# Patient Record
Sex: Male | Born: 1950
Health system: Southern US, Community
[De-identification: ages and names within clinical notes are randomized; demographics above are authoritative.]

## PROBLEM LIST (undated history)

## (undated) DIAGNOSIS — T8859XA Other complications of anesthesia, initial encounter: Secondary | ICD-10-CM

## (undated) DIAGNOSIS — J45909 Unspecified asthma, uncomplicated: Secondary | ICD-10-CM

## (undated) DIAGNOSIS — E119 Type 2 diabetes mellitus without complications: Secondary | ICD-10-CM

## (undated) DIAGNOSIS — N400 Enlarged prostate without lower urinary tract symptoms: Secondary | ICD-10-CM

## (undated) DIAGNOSIS — I10 Essential (primary) hypertension: Secondary | ICD-10-CM

## (undated) DIAGNOSIS — M1990 Unspecified osteoarthritis, unspecified site: Secondary | ICD-10-CM

## (undated) DIAGNOSIS — D573 Sickle-cell trait: Secondary | ICD-10-CM

## (undated) DIAGNOSIS — K219 Gastro-esophageal reflux disease without esophagitis: Secondary | ICD-10-CM

## (undated) DIAGNOSIS — E785 Hyperlipidemia, unspecified: Secondary | ICD-10-CM

## (undated) DIAGNOSIS — G4730 Sleep apnea, unspecified: Secondary | ICD-10-CM

## (undated) HISTORY — DX: Type 2 diabetes mellitus without complications: E11.9

## (undated) HISTORY — DX: Sleep apnea, unspecified: G47.30

## (undated) HISTORY — PX: COLONOSCOPY: SHX174

## (undated) HISTORY — PX: OTHER SURGICAL HISTORY: SHX169

## (undated) HISTORY — DX: Hyperlipidemia, unspecified: E78.5

## (undated) HISTORY — DX: Unspecified asthma, uncomplicated: J45.909

## (undated) HISTORY — DX: Sickle-cell trait: D57.3

## (undated) HISTORY — DX: Essential (primary) hypertension: I10

## (undated) HISTORY — PX: CARDIAC CATHETERIZATION: SHX172

---

## 2005-04-19 ENCOUNTER — Ambulatory Visit: Payer: Self-pay | Admitting: Internal Medicine

## 2005-07-25 ENCOUNTER — Ambulatory Visit: Payer: Self-pay | Admitting: Internal Medicine

## 2006-05-29 ENCOUNTER — Emergency Department: Payer: Self-pay | Admitting: Emergency Medicine

## 2006-05-29 ENCOUNTER — Other Ambulatory Visit: Payer: Self-pay

## 2006-05-30 ENCOUNTER — Ambulatory Visit: Payer: Self-pay | Admitting: Emergency Medicine

## 2006-09-21 ENCOUNTER — Emergency Department: Payer: Self-pay

## 2006-09-28 ENCOUNTER — Ambulatory Visit: Payer: Self-pay | Admitting: Internal Medicine

## 2006-11-19 ENCOUNTER — Emergency Department: Payer: Self-pay | Admitting: General Practice

## 2006-11-19 ENCOUNTER — Other Ambulatory Visit: Payer: Self-pay

## 2006-11-27 ENCOUNTER — Ambulatory Visit: Payer: Self-pay | Admitting: Internal Medicine

## 2007-02-19 ENCOUNTER — Emergency Department: Payer: Self-pay | Admitting: Emergency Medicine

## 2007-02-19 ENCOUNTER — Other Ambulatory Visit: Payer: Self-pay

## 2007-05-09 ENCOUNTER — Ambulatory Visit: Payer: Self-pay | Admitting: Internal Medicine

## 2007-05-16 ENCOUNTER — Ambulatory Visit: Payer: Self-pay | Admitting: Internal Medicine

## 2007-06-06 ENCOUNTER — Ambulatory Visit: Payer: Self-pay | Admitting: Unknown Physician Specialty

## 2007-06-12 ENCOUNTER — Ambulatory Visit: Payer: Self-pay | Admitting: Unknown Physician Specialty

## 2007-08-19 ENCOUNTER — Other Ambulatory Visit: Payer: Self-pay

## 2007-08-19 ENCOUNTER — Emergency Department: Payer: Self-pay

## 2007-12-20 ENCOUNTER — Ambulatory Visit: Payer: Self-pay | Admitting: Internal Medicine

## 2008-01-22 ENCOUNTER — Emergency Department: Payer: Self-pay | Admitting: Emergency Medicine

## 2008-01-22 ENCOUNTER — Other Ambulatory Visit: Payer: Self-pay

## 2008-03-13 ENCOUNTER — Ambulatory Visit: Payer: Self-pay | Admitting: Cardiology

## 2008-09-08 ENCOUNTER — Ambulatory Visit: Payer: Self-pay | Admitting: Internal Medicine

## 2008-11-03 ENCOUNTER — Ambulatory Visit: Payer: Self-pay | Admitting: Adult Health Nurse Practitioner

## 2008-12-14 ENCOUNTER — Emergency Department: Payer: Self-pay | Admitting: Emergency Medicine

## 2009-03-30 ENCOUNTER — Ambulatory Visit: Payer: Self-pay | Admitting: Unknown Physician Specialty

## 2009-05-07 ENCOUNTER — Ambulatory Visit: Payer: Self-pay | Admitting: Unknown Physician Specialty

## 2009-08-31 ENCOUNTER — Ambulatory Visit: Payer: Self-pay | Admitting: Internal Medicine

## 2009-10-08 ENCOUNTER — Ambulatory Visit: Payer: Self-pay | Admitting: Internal Medicine

## 2010-05-04 ENCOUNTER — Ambulatory Visit: Payer: Self-pay | Admitting: Internal Medicine

## 2011-10-26 ENCOUNTER — Ambulatory Visit: Payer: Self-pay | Admitting: Internal Medicine

## 2011-11-24 ENCOUNTER — Ambulatory Visit: Payer: Self-pay | Admitting: Specialist

## 2011-11-24 LAB — CREATININE, SERUM
Creatinine: 1.08 mg/dL (ref 0.60–1.30)
EGFR (African American): >60
EGFR (Non-African Amer.): >60

## 2013-01-28 ENCOUNTER — Ambulatory Visit: Payer: Self-pay

## 2013-03-11 ENCOUNTER — Ambulatory Visit: Payer: Self-pay | Admitting: Urology

## 2013-09-25 ENCOUNTER — Ambulatory Visit: Payer: Self-pay | Admitting: Urology

## 2014-01-16 DIAGNOSIS — E785 Hyperlipidemia, unspecified: Secondary | ICD-10-CM | POA: Insufficient documentation

## 2014-01-16 DIAGNOSIS — I10 Essential (primary) hypertension: Secondary | ICD-10-CM | POA: Insufficient documentation

## 2014-01-29 DIAGNOSIS — J449 Chronic obstructive pulmonary disease, unspecified: Secondary | ICD-10-CM | POA: Insufficient documentation

## 2014-01-29 DIAGNOSIS — G4730 Sleep apnea, unspecified: Secondary | ICD-10-CM | POA: Insufficient documentation

## 2014-11-26 DIAGNOSIS — E119 Type 2 diabetes mellitus without complications: Secondary | ICD-10-CM | POA: Insufficient documentation

## 2015-10-07 DIAGNOSIS — M545 Low back pain, unspecified: Secondary | ICD-10-CM | POA: Insufficient documentation

## 2015-10-07 DIAGNOSIS — G8929 Other chronic pain: Secondary | ICD-10-CM | POA: Insufficient documentation

## 2016-08-18 ENCOUNTER — Other Ambulatory Visit: Payer: Self-pay | Admitting: Otolaryngology

## 2016-08-18 DIAGNOSIS — R42 Dizziness and giddiness: Secondary | ICD-10-CM

## 2016-08-26 ENCOUNTER — Ambulatory Visit
Admission: RE | Admit: 2016-08-26 | Discharge: 2016-08-26 | Disposition: A | Discharge status: Home / Self Care | Payer: BLUE CROSS/BLUE SHIELD | Source: Ambulatory Visit | Attending: Otolaryngology | Admitting: Otolaryngology

## 2016-08-26 DIAGNOSIS — G319 Degenerative disease of nervous system, unspecified: Secondary | ICD-10-CM | POA: Diagnosis not present

## 2016-08-26 DIAGNOSIS — R42 Dizziness and giddiness: Secondary | ICD-10-CM | POA: Diagnosis not present

## 2016-08-26 MED ORDER — GADOBENATE DIMEGLUMINE 529 MG/ML IV SOLN
20.0000 mL | Freq: Once | INTRAVENOUS | Status: AC | PRN
  Administered 2016-08-26: 18 mL via INTRAVENOUS

## 2016-11-07 DIAGNOSIS — R0602 Shortness of breath: Secondary | ICD-10-CM | POA: Diagnosis not present

## 2016-11-07 DIAGNOSIS — Z7141 Alcohol abuse counseling and surveillance of alcoholic: Secondary | ICD-10-CM | POA: Diagnosis not present

## 2016-11-07 DIAGNOSIS — E6609 Other obesity due to excess calories: Secondary | ICD-10-CM | POA: Diagnosis not present

## 2016-11-07 DIAGNOSIS — G4733 Obstructive sleep apnea (adult) (pediatric): Secondary | ICD-10-CM | POA: Diagnosis not present

## 2016-11-24 DIAGNOSIS — E119 Type 2 diabetes mellitus without complications: Secondary | ICD-10-CM | POA: Diagnosis not present

## 2016-11-29 DIAGNOSIS — G4733 Obstructive sleep apnea (adult) (pediatric): Secondary | ICD-10-CM | POA: Diagnosis not present

## 2016-11-29 DIAGNOSIS — R413 Other amnesia: Secondary | ICD-10-CM | POA: Diagnosis not present

## 2016-11-29 DIAGNOSIS — H8110 Benign paroxysmal vertigo, unspecified ear: Secondary | ICD-10-CM | POA: Diagnosis not present

## 2016-11-30 DIAGNOSIS — H8110 Benign paroxysmal vertigo, unspecified ear: Secondary | ICD-10-CM | POA: Insufficient documentation

## 2016-12-07 DIAGNOSIS — H8112 Benign paroxysmal vertigo, left ear: Secondary | ICD-10-CM | POA: Diagnosis not present

## 2016-12-13 DIAGNOSIS — R0602 Shortness of breath: Secondary | ICD-10-CM | POA: Diagnosis not present

## 2016-12-15 DIAGNOSIS — H8112 Benign paroxysmal vertigo, left ear: Secondary | ICD-10-CM | POA: Diagnosis not present

## 2016-12-25 DIAGNOSIS — G4733 Obstructive sleep apnea (adult) (pediatric): Secondary | ICD-10-CM | POA: Diagnosis not present

## 2016-12-29 DIAGNOSIS — H8112 Benign paroxysmal vertigo, left ear: Secondary | ICD-10-CM | POA: Diagnosis not present

## 2017-01-10 DIAGNOSIS — G4733 Obstructive sleep apnea (adult) (pediatric): Secondary | ICD-10-CM | POA: Diagnosis not present

## 2017-01-12 DIAGNOSIS — H8112 Benign paroxysmal vertigo, left ear: Secondary | ICD-10-CM | POA: Diagnosis not present

## 2017-01-23 DIAGNOSIS — E119 Type 2 diabetes mellitus without complications: Secondary | ICD-10-CM | POA: Diagnosis not present

## 2017-01-23 DIAGNOSIS — H8110 Benign paroxysmal vertigo, unspecified ear: Secondary | ICD-10-CM | POA: Diagnosis not present

## 2017-01-23 DIAGNOSIS — R972 Elevated prostate specific antigen [PSA]: Secondary | ICD-10-CM | POA: Diagnosis not present

## 2017-01-23 DIAGNOSIS — J449 Chronic obstructive pulmonary disease, unspecified: Secondary | ICD-10-CM | POA: Diagnosis not present

## 2017-01-23 DIAGNOSIS — I10 Essential (primary) hypertension: Secondary | ICD-10-CM | POA: Diagnosis not present

## 2017-01-30 DIAGNOSIS — E7800 Pure hypercholesterolemia, unspecified: Secondary | ICD-10-CM | POA: Diagnosis not present

## 2017-01-30 DIAGNOSIS — I10 Essential (primary) hypertension: Secondary | ICD-10-CM | POA: Diagnosis not present

## 2017-01-30 DIAGNOSIS — E119 Type 2 diabetes mellitus without complications: Secondary | ICD-10-CM | POA: Diagnosis not present

## 2017-01-30 DIAGNOSIS — H8110 Benign paroxysmal vertigo, unspecified ear: Secondary | ICD-10-CM | POA: Diagnosis not present

## 2017-05-11 DIAGNOSIS — Z23 Encounter for immunization: Secondary | ICD-10-CM | POA: Diagnosis not present

## 2017-05-15 DIAGNOSIS — J45998 Other asthma: Secondary | ICD-10-CM | POA: Diagnosis not present

## 2017-05-15 DIAGNOSIS — Z23 Encounter for immunization: Secondary | ICD-10-CM | POA: Diagnosis not present

## 2017-05-15 DIAGNOSIS — G4710 Hypersomnia, unspecified: Secondary | ICD-10-CM | POA: Diagnosis not present

## 2017-05-15 DIAGNOSIS — Z7141 Alcohol abuse counseling and surveillance of alcoholic: Secondary | ICD-10-CM | POA: Diagnosis not present

## 2017-05-15 DIAGNOSIS — G4733 Obstructive sleep apnea (adult) (pediatric): Secondary | ICD-10-CM | POA: Diagnosis not present

## 2017-05-31 DIAGNOSIS — R413 Other amnesia: Secondary | ICD-10-CM | POA: Diagnosis not present

## 2017-05-31 DIAGNOSIS — G4733 Obstructive sleep apnea (adult) (pediatric): Secondary | ICD-10-CM | POA: Diagnosis not present

## 2017-05-31 DIAGNOSIS — H8110 Benign paroxysmal vertigo, unspecified ear: Secondary | ICD-10-CM | POA: Diagnosis not present

## 2017-06-05 DIAGNOSIS — G4733 Obstructive sleep apnea (adult) (pediatric): Secondary | ICD-10-CM | POA: Diagnosis not present

## 2017-09-04 DIAGNOSIS — E7800 Pure hypercholesterolemia, unspecified: Secondary | ICD-10-CM | POA: Diagnosis not present

## 2017-09-04 DIAGNOSIS — I10 Essential (primary) hypertension: Secondary | ICD-10-CM | POA: Diagnosis not present

## 2017-09-04 DIAGNOSIS — H8110 Benign paroxysmal vertigo, unspecified ear: Secondary | ICD-10-CM | POA: Diagnosis not present

## 2017-09-04 DIAGNOSIS — E119 Type 2 diabetes mellitus without complications: Secondary | ICD-10-CM | POA: Diagnosis not present

## 2017-09-11 DIAGNOSIS — G4733 Obstructive sleep apnea (adult) (pediatric): Secondary | ICD-10-CM | POA: Diagnosis not present

## 2017-09-11 DIAGNOSIS — M25512 Pain in left shoulder: Secondary | ICD-10-CM | POA: Diagnosis not present

## 2017-09-11 DIAGNOSIS — M19011 Primary osteoarthritis, right shoulder: Secondary | ICD-10-CM | POA: Diagnosis not present

## 2017-09-11 DIAGNOSIS — Z0000 Encounter for general adult medical examination without abnormal findings: Secondary | ICD-10-CM | POA: Diagnosis not present

## 2017-09-11 DIAGNOSIS — I10 Essential (primary) hypertension: Secondary | ICD-10-CM | POA: Diagnosis not present

## 2017-09-19 ENCOUNTER — Ambulatory Visit: Payer: BLUE CROSS/BLUE SHIELD

## 2017-09-19 DIAGNOSIS — G4733 Obstructive sleep apnea (adult) (pediatric): Secondary | ICD-10-CM

## 2017-09-19 NOTE — Progress Notes (Signed)
95 percentile pressure 11    95th percentile leak 19.8   apnea index 0.4 /hr  apnea-hypopnea index  0.5 /hr   total days used  >4 hr 31 days  total days used <4 hr 88 days  Total compliance 24 percent  Will bring pt a new mask to help with putting on prior to falling sleep

## 2017-09-25 ENCOUNTER — Ambulatory Visit: Payer: Self-pay | Admitting: Internal Medicine

## 2017-09-26 ENCOUNTER — Ambulatory Visit (INDEPENDENT_AMBULATORY_CARE_PROVIDER_SITE_OTHER): Payer: BLUE CROSS/BLUE SHIELD

## 2017-09-26 DIAGNOSIS — G4733 Obstructive sleep apnea (adult) (pediatric): Secondary | ICD-10-CM | POA: Diagnosis not present

## 2017-09-26 NOTE — Progress Notes (Signed)
Charles Mata came in for a mask fit. He has been falling asleep watching tv so I gave him a mask he could watch TV in so when fell asleep he would have cpap on.

## 2017-10-22 ENCOUNTER — Telehealth: Payer: Self-pay

## 2017-10-22 NOTE — Telephone Encounter (Signed)
Patient advised tax paper for 2018 is ready for pickup.[place]

## 2017-10-23 ENCOUNTER — Ambulatory Visit: Payer: BLUE CROSS/BLUE SHIELD | Admitting: Internal Medicine

## 2017-10-23 ENCOUNTER — Encounter: Payer: Self-pay | Admitting: Internal Medicine

## 2017-10-23 VITALS — BP 128/72 | HR 91 | Resp 16 | Ht 68.0 in | Wt 246.8 lb

## 2017-10-23 DIAGNOSIS — J449 Chronic obstructive pulmonary disease, unspecified: Secondary | ICD-10-CM

## 2017-10-23 DIAGNOSIS — Z9989 Dependence on other enabling machines and devices: Secondary | ICD-10-CM

## 2017-10-23 DIAGNOSIS — G4733 Obstructive sleep apnea (adult) (pediatric): Secondary | ICD-10-CM | POA: Diagnosis not present

## 2017-10-23 DIAGNOSIS — F1020 Alcohol dependence, uncomplicated: Secondary | ICD-10-CM

## 2017-10-23 MED ORDER — FLUTICASONE FUROATE-VILANTEROL 100-25 MCG/INH IN AEPB: 1.0000 | INHALATION_SPRAY | Freq: Every day | RESPIRATORY_TRACT | 4 refills | Status: DC

## 2017-10-23 NOTE — Progress Notes (Signed)
[facility] [place], [place] [address]  Pulmonary Sleep Medicine  Office Visit Note  Patient Name: Charles Mata DOB: 1950/11/30 MRN [id number]  Date of Service: 10/23/2017  Complaints/HPI: States that his breathing has been a little worse of late. He has had to use jhis inhaer more frequently. Patient states tha the has been trying to use the CPAP but falls asleep early sometimes  Without the mask.  In addition he continues to be valve on regular basis.  States that is the only thing he does.  I explained to him that the alcohol is probably helping to disrupt his sleep.  Says now that he understands this he is going to try to stop drinking.  He does not smoke.  He is currently on just Combivent and will probably benefit from additional the [place] by and steroid for his COPD  ROS  General: (-) fever, (-) chills, (-) night sweats, (-) weakness Skin: (-) rashes, (-) itching,. Eyes: (-) visual changes, (-) redness, (-) itching. Nose and Sinuses: (-) nasal stuffiness or itchiness, (-) postnasal drip, (-) nosebleeds, (-) sinus trouble. Mouth and Throat: (-) sore throat, (-) hoarseness. Neck: (-) swollen glands, (-) enlarged thyroid, (-) neck pain. Respiratory: - cough, (-) bloody sputum, + shortness of breath, - wheezing. Cardiovascular: - ankle swelling, (-) chest pain. Lymphatic: (-) lymph node enlargement. Neurologic: (-) numbness, (-) tingling. Psychiatric: (-) anxiety, (-) depression   Current Medication: Outpatient Encounter Medications as of 10/23/2017  Medication Sig  . glucose blood (CONTOUR NEXT TEST) test strip   . glyBURIDE (DIABETA) 5 MG tablet TAKE 1 TABLET BY MOUTH EVERY DAY WITH BREAKFAST  . Ipratropium-Albuterol (COMBIVENT RESPIMAT) 20-100 MCG/ACT AERS respimat INHALE 1 PUFF BY MOUTH 4 TIMES A DAY AS NEEDED  . lisinopril (PRINIVIL,ZESTRIL) 5 MG tablet Take by mouth.  . montelukast (SINGULAIR) 10 MG tablet TAKE 1 TABLET BY MOUTH ONCE DAILY  .  omeprazole (PRILOSEC) 40 MG capsule TAKE 1 CAPSULE BY MOUTH EVERY DAY  . tamsulosin (FLOMAX) 0.4 MG CAPS capsule TAKE ONE CAPSULE BY MOUTH EVERY DAY 1/2 HOUR AFTER A MEAL  . Vitamin D, Ergocalciferol, (DRISDOL) 50000 units CAPS capsule TAKE ONE CAPSULE BY MOUTH ONE TIME PER WEEK  . aspirin EC 81 MG tablet Take by mouth.  . CVS VITAMIN B12 1000 MCG tablet TAKE 1 TABLET (1,000 MCG TOTAL) BY MOUTH ONCE DAILY.  . furosemide (LASIX) 40 MG tablet TAKE 1 TABLET (40 MG TOTAL) BY MOUTH ONCE DAILY.  Marland Kitchen oxymetazoline (NASAL RELIEF) 0.05 % nasal spray Place into the nose.  Marland Kitchen PROCTOSOL HC 2.5 % rectal cream APPLY TO AFFECTED AREA 3 TIMES A DAY  . rosuvastatin (CRESTOR) 10 MG tablet Take 5 mg by mouth daily.   No facility-administered encounter medications on file as of 10/23/2017.     Surgical History: Past Surgical History:  Procedure Laterality Date  . COLONOSCOPY    . heart catheriation      Medical History: Past Medical History:  Diagnosis Date  . Asthma   . Diabetes ([place])   . Hyperlipidemia   . Hypertension   . Sickle cell trait ([place])   . Sleep apnea     Family History: History reviewed. No pertinent family history.  Social History: Social History   Socioeconomic History  . Marital status: Widowed    Spouse name: Not on file  . Number of children: Not on file  . Years of education: Not on file  . Highest education level: Not on  file  Social Needs  . Financial resource strain: Not on file  . Food insecurity - worry: Not on file  . Food insecurity - inability: Not on file  . Transportation needs - medical: Not on file  . Transportation needs - non-medical: Not on file  Occupational History  . Not on file  Tobacco Use  . Smoking status: Former Research scientist (life sciences)  . Smokeless tobacco: Never Used  Substance and Sexual Activity  . Alcohol use: Yes    Alcohol/week: 1.8 oz    Types: 3 Glasses of wine per week    Frequency: Never    Comment: a night  . Drug use: No  . Sexual activity:  Not on file  Other Topics Concern  . Not on file  Social History Narrative  . Not on file    Vital Signs: Blood pressure 128/72, pulse 91, resp. rate 16, height 5\' 8"  (1.727 m), weight 246 lb 12.8 oz (111.9 kg), SpO2 95 %.  Examination: General Appearance: The patient is well-developed, well-nourished, and in no distress. Skin: Gross inspection of skin unremarkable. Head: normocephalic, no gross deformities. Eyes: no gross deformities noted. ENT: ears appear grossly normal no exudates. Neck: Supple. No thyromegaly. No LAD. Respiratory: scattered rhonchi. Cardiovascular: Normal S1 and S2 without murmur or rub. Extremities: No cyanosis. pulses are equal. Neurologic: Alert and oriented. No involuntary movements.  LABS: No results found for this or any previous visit (from the past 2160 hour(s)).  Radiology: Mr Albertina Sen[place] XT Contrast  Result Date: 08/26/2016 CLINICAL DATA:  Dizziness for 2 months. Occasional ringing in the ears. EXAM: MRI HEAD WITHOUT AND WITH CONTRAST TECHNIQUE: Multiplanar, multiecho pulse sequences of the brain and surrounding structures were obtained without and with intravenous contrast. CONTRAST:  52mL MULTIHANCE GADOBENATE DIMEGLUMINE 529 MG/ML IV SOLN COMPARISON:  None. FINDINGS: Brain: No evidence for acute infarction, hemorrhage, mass lesion, hydrocephalus, or extra-axial fluid. Premature for age cerebral and cerebellar atrophy. No significant white matter disease. Thin-section imaging through the posterior fossa demonstrates normal internal auditory canals. No evidence for vestibular schwannoma, posterior fossa mass, or temporal bone inflammatory process. Vascular: Normal flow voids.  Major venous sinuses are patent. Skull and upper cervical spine: Normal marrow signal. Sinuses/Orbits: Negative. Other: None. IMPRESSION: Atrophy, premature for age. No acute intracranial findings. No structural abnormality is seen related to dizziness. Electronically Signed   By:  Staci Righter M.D.   On: 08/26/2016 14:35    No results found.  No results found.    Assessment and Plan: Patient Active Problem List   Diagnosis Date Noted  . Benign paroxysmal positional vertigo 11/30/2016  . Chronic midline low back pain without sciatica 10/07/2015  . Type 2 diabetes mellitus, controlled ([place]) 11/26/2014  . COPD, mild ([place]) 01/29/2014  . Sleep apnea 01/29/2014  . Hyperlipemia 01/16/2014  . Hypertension 01/16/2014    1. COPD we will continue with the Combivent and today we added breo to his regimen.  Patient needs to be regular with his inhalers.  I given him instructions on how to use the [facility]. 2. Ongoing ETOH abuse  Once again discussed at length with him the need to stop drinking he says that he is going to try once more 3. Sleep Apnea with sleep disruption due to ETOH will continue withPAP therapy at this time.  We will continue with supportive care and follow along 4. Obesity be needs to work on weight loss this was discussed at  Length. the weight gain is likely related to ongoing  alcohol abuse  General Counseling: I have discussed the findings of the evaluation and examination with Charles Mata.  I have also discussed any further diagnostic evaluation thatmay be needed or ordered today. Gildo verbalizes understanding of the findings of todays visit. We also reviewed his medications today and discussed drug interactions and side effects including but not limited excessive drowsiness and altered mental states. We also discussed that there is always a risk not just to him but also people around him. he has been encouraged to call the office with any questions or concerns that should arise related to todays visit.    Time spent: 78min  I have personally obtained a history, examined the patient, evaluated laboratory and imaging results, formulated the assessment and plan and placed orders.    [name], MD [facility] Pulmonary and Critical Care Sleep medicine

## 2017-10-23 NOTE — Patient Instructions (Signed)

## 2017-11-22 DIAGNOSIS — E119 Type 2 diabetes mellitus without complications: Secondary | ICD-10-CM | POA: Diagnosis not present

## 2017-11-22 DIAGNOSIS — G4733 Obstructive sleep apnea (adult) (pediatric): Secondary | ICD-10-CM | POA: Diagnosis not present

## 2017-11-22 DIAGNOSIS — M25511 Pain in right shoulder: Secondary | ICD-10-CM | POA: Diagnosis not present

## 2017-11-22 DIAGNOSIS — I10 Essential (primary) hypertension: Secondary | ICD-10-CM | POA: Diagnosis not present

## 2017-11-23 ENCOUNTER — Other Ambulatory Visit: Payer: Self-pay | Admitting: Internal Medicine

## 2017-11-23 DIAGNOSIS — M25511 Pain in right shoulder: Secondary | ICD-10-CM

## 2017-11-23 DIAGNOSIS — Z794 Long term (current) use of insulin: Secondary | ICD-10-CM

## 2017-11-23 DIAGNOSIS — E118 Type 2 diabetes mellitus with unspecified complications: Secondary | ICD-10-CM

## 2017-11-29 DIAGNOSIS — G4733 Obstructive sleep apnea (adult) (pediatric): Secondary | ICD-10-CM | POA: Diagnosis not present

## 2017-11-29 DIAGNOSIS — R413 Other amnesia: Secondary | ICD-10-CM | POA: Diagnosis not present

## 2017-11-29 DIAGNOSIS — H8110 Benign paroxysmal vertigo, unspecified ear: Secondary | ICD-10-CM | POA: Diagnosis not present

## 2017-11-29 DIAGNOSIS — M25511 Pain in right shoulder: Secondary | ICD-10-CM | POA: Diagnosis not present

## 2017-12-04 ENCOUNTER — Ambulatory Visit
Admission: RE | Admit: 2017-12-04 | Discharge: 2017-12-04 | Disposition: A | Discharge status: Home / Self Care | Payer: BLUE CROSS/BLUE SHIELD | Source: Ambulatory Visit | Attending: Internal Medicine | Admitting: Internal Medicine

## 2017-12-04 DIAGNOSIS — Z794 Long term (current) use of insulin: Secondary | ICD-10-CM

## 2017-12-04 DIAGNOSIS — E119 Type 2 diabetes mellitus without complications: Secondary | ICD-10-CM | POA: Insufficient documentation

## 2017-12-04 DIAGNOSIS — I10 Essential (primary) hypertension: Secondary | ICD-10-CM | POA: Insufficient documentation

## 2017-12-04 DIAGNOSIS — M25511 Pain in right shoulder: Secondary | ICD-10-CM | POA: Insufficient documentation

## 2017-12-04 DIAGNOSIS — S46011A Strain of muscle(s) and tendon(s) of the rotator cuff of right shoulder, initial encounter: Secondary | ICD-10-CM | POA: Diagnosis not present

## 2017-12-04 DIAGNOSIS — M24811 Other specific joint derangements of right shoulder, not elsewhere classified: Secondary | ICD-10-CM | POA: Insufficient documentation

## 2017-12-04 DIAGNOSIS — E118 Type 2 diabetes mellitus with unspecified complications: Secondary | ICD-10-CM

## 2017-12-26 DIAGNOSIS — M25512 Pain in left shoulder: Secondary | ICD-10-CM | POA: Diagnosis not present

## 2017-12-26 DIAGNOSIS — M25511 Pain in right shoulder: Secondary | ICD-10-CM | POA: Diagnosis not present

## 2017-12-26 DIAGNOSIS — M7541 Impingement syndrome of right shoulder: Secondary | ICD-10-CM | POA: Diagnosis not present

## 2018-01-02 ENCOUNTER — Ambulatory Visit: Payer: BLUE CROSS/BLUE SHIELD

## 2018-01-02 DIAGNOSIS — G4733 Obstructive sleep apnea (adult) (pediatric): Secondary | ICD-10-CM | POA: Diagnosis not present

## 2018-01-02 DIAGNOSIS — E119 Type 2 diabetes mellitus without complications: Secondary | ICD-10-CM | POA: Diagnosis not present

## 2018-01-02 NOTE — Progress Notes (Signed)
95 percentile pressure 11   95th percentile leak 29.6   apnea index 0.8 /hr  apnea-hypopnea index  1.0 /hr   total days used  >4 hr 58 days  total days used <4 hr 1 days  Total compliance 97 percent  Pt doing much better!

## 2018-01-23 ENCOUNTER — Ambulatory Visit: Payer: BLUE CROSS/BLUE SHIELD

## 2018-01-23 DIAGNOSIS — G4733 Obstructive sleep apnea (adult) (pediatric): Secondary | ICD-10-CM

## 2018-01-23 NOTE — Progress Notes (Signed)
Pt received new mask

## 2018-02-23 ENCOUNTER — Other Ambulatory Visit: Payer: Self-pay

## 2018-02-23 ENCOUNTER — Emergency Department: Payer: BLUE CROSS/BLUE SHIELD

## 2018-02-23 DIAGNOSIS — T608X1A Toxic effect of other pesticides, accidental (unintentional), initial encounter: Secondary | ICD-10-CM | POA: Insufficient documentation

## 2018-02-23 DIAGNOSIS — Z5321 Procedure and treatment not carried out due to patient leaving prior to being seen by health care provider: Secondary | ICD-10-CM | POA: Insufficient documentation

## 2018-02-23 DIAGNOSIS — R0603 Acute respiratory distress: Secondary | ICD-10-CM | POA: Diagnosis not present

## 2018-02-23 NOTE — ED Notes (Signed)
Spoke with Dr. Beather Arbour regarding patient, order received.

## 2018-02-23 NOTE — ED Triage Notes (Signed)
Patient reports after inhaling fly spray he fell asleep and when he woke up his vision was tunneled and he was tingling.  Patient with no respiratory distress noted, speaking in complete sentences.

## 2018-02-24 ENCOUNTER — Emergency Department
Admission: EM | Admit: 2018-02-24 | Discharge: 2018-02-24 | Disposition: A | Discharge status: Home / Self Care | Payer: BLUE CROSS/BLUE SHIELD | Attending: Emergency Medicine | Admitting: Emergency Medicine

## 2018-02-24 DIAGNOSIS — R0603 Acute respiratory distress: Secondary | ICD-10-CM | POA: Diagnosis not present

## 2018-02-25 ENCOUNTER — Telehealth: Payer: Self-pay | Admitting: Emergency Medicine

## 2018-02-25 NOTE — Telephone Encounter (Signed)
Called patient due to lwot to inquire about condition and follow up plans. Left message.   

## 2018-02-27 DIAGNOSIS — M7541 Impingement syndrome of right shoulder: Secondary | ICD-10-CM | POA: Diagnosis not present

## 2018-03-04 ENCOUNTER — Other Ambulatory Visit: Payer: Self-pay | Admitting: Internal Medicine

## 2018-03-07 DIAGNOSIS — H43812 Vitreous degeneration, left eye: Secondary | ICD-10-CM | POA: Diagnosis not present

## 2018-03-12 ENCOUNTER — Encounter: Payer: Self-pay | Admitting: Internal Medicine

## 2018-03-12 ENCOUNTER — Ambulatory Visit: Payer: BLUE CROSS/BLUE SHIELD | Admitting: Internal Medicine

## 2018-03-12 VITALS — BP 132/68 | HR 88 | Resp 16 | Ht 68.0 in | Wt 226.8 lb

## 2018-03-12 DIAGNOSIS — J449 Chronic obstructive pulmonary disease, unspecified: Secondary | ICD-10-CM

## 2018-03-12 DIAGNOSIS — R0602 Shortness of breath: Secondary | ICD-10-CM | POA: Diagnosis not present

## 2018-03-12 DIAGNOSIS — G4733 Obstructive sleep apnea (adult) (pediatric): Secondary | ICD-10-CM | POA: Diagnosis not present

## 2018-03-12 DIAGNOSIS — F10288 Alcohol dependence with other alcohol-induced disorder: Secondary | ICD-10-CM

## 2018-03-12 DIAGNOSIS — Z9989 Dependence on other enabling machines and devices: Secondary | ICD-10-CM

## 2018-03-12 DIAGNOSIS — J4489 Other specified chronic obstructive pulmonary disease: Secondary | ICD-10-CM

## 2018-03-12 NOTE — Patient Instructions (Signed)

## 2018-03-12 NOTE — Progress Notes (Signed)
[facility] [place], [place] [address]  Pulmonary Sleep Medicine   Office Visit Note  Patient Name: Charles Mata DOB: 1951-03-29 MRN [id number]  Date of Service: 03/12/2018  Complaints/HPI:  Is doing fairly well.  States he has some stops mode of smoking as well as stop drinking.  Over the course at his last visit he has lost approximately 20 lb.  States that down he is exercising little bit more.  Noted patient's in hospital.  His some overall sugars have also improved.  He is trying to use CPAP as prescribed  ROS  General: (-) fever, (-) chills, (-) night sweats, (-) weakness Skin: (-) rashes, (-) itching,. Eyes: (-) visual changes, (-) redness, (-) itching. Nose and Sinuses: (-) nasal stuffiness or itchiness, (-) postnasal drip, (-) nosebleeds, (-) sinus trouble. Mouth and Throat: (-) sore throat, (-) hoarseness. Neck: (-) swollen glands, (-) enlarged thyroid, (-) neck pain. Respiratory: - cough, (-) bloody sputum, + shortness of breath, - wheezing. Cardiovascular: - ankle swelling, (-) chest pain. Lymphatic: (-) lymph node enlargement. Neurologic: (-) numbness, (-) tingling. Psychiatric: (-) anxiety, (-) depression   Current Medication: Outpatient Encounter Medications as of 03/12/2018  Medication Sig  . aspirin EC 81 MG tablet Take by mouth.  . CVS VITAMIN B12 1000 MCG tablet TAKE 1 TABLET (1,000 MCG TOTAL) BY MOUTH ONCE DAILY.  . fluticasone furoate-vilanterol (BREO ELLIPTA) 100-25 MCG/INH AEPB Inhale 1 puff into the lungs daily.  . furosemide (LASIX) 40 MG tablet TAKE 1 TABLET (40 MG TOTAL) BY MOUTH ONCE DAILY.  Marland Kitchen glucose blood (CONTOUR NEXT TEST) test strip   . glyBURIDE (DIABETA) 5 MG tablet TAKE 1 TABLET BY MOUTH EVERY DAY WITH BREAKFAST  . Ipratropium-Albuterol (COMBIVENT RESPIMAT) 20-100 MCG/ACT AERS respimat INHALE 1 PUFF BY MOUTH 4 TIMES A DAY AS NEEDED  . lisinopril (PRINIVIL,ZESTRIL) 5 MG tablet Take by mouth.  . montelukast  (SINGULAIR) 10 MG tablet TAKE 1 TABLET BY MOUTH ONCE DAILY  . omeprazole (PRILOSEC) 40 MG capsule TAKE 1 CAPSULE BY MOUTH EVERY DAY  . oxymetazoline (NASAL RELIEF) 0.05 % nasal spray Place into the nose.  Marland Kitchen PROCTOSOL HC 2.5 % rectal cream APPLY TO AFFECTED AREA 3 TIMES A DAY  . rosuvastatin (CRESTOR) 10 MG tablet Take 5 mg by mouth daily.  . tamsulosin (FLOMAX) 0.4 MG CAPS capsule TAKE ONE CAPSULE BY MOUTH EVERY DAY 1/2 HOUR AFTER A MEAL  . Vitamin D, Ergocalciferol, (DRISDOL) 50000 units CAPS capsule TAKE ONE CAPSULE BY MOUTH ONE TIME PER WEEK   No facility-administered encounter medications on file as of 03/12/2018.     Surgical History: Past Surgical History:  Procedure Laterality Date  . COLONOSCOPY    . heart catheriation      Medical History: Past Medical History:  Diagnosis Date  . Asthma   . Diabetes ([place])   . Hyperlipidemia   . Hypertension   . Sickle cell trait ([place])   . Sleep apnea     Family History: History reviewed. No pertinent family history.  Social History: Social History   Socioeconomic History  . Marital status: Widowed    Spouse name: Not on file  . Number of children: Not on file  . Years of education: Not on file  . Highest education level: Not on file  Occupational History  . Not on file  Social Needs  . Financial resource strain: Not on file  . Food insecurity:    Worry: Not on file  Inability: Not on file  . Transportation needs:    Medical: Not on file    Non-medical: Not on file  Tobacco Use  . Smoking status: Former Research scientist (life sciences)  . Smokeless tobacco: Never Used  Substance and Sexual Activity  . Alcohol use: Not Currently    Alcohol/week: 1.8 oz    Types: 3 Glasses of wine per week    Frequency: Never    Comment: a night  . Drug use: No  . Sexual activity: Not on file  Lifestyle  . Physical activity:    Days per week: Not on file    Minutes per session: Not on file  . Stress: Not on file  Relationships  . Social connections:     Talks on phone: Not on file    Gets together: Not on file    Attends religious service: Not on file    Active member of club or organization: Not on file    Attends meetings of clubs or organizations: Not on file    Relationship status: Not on file  . Intimate partner violence:    Fear of current or ex partner: Not on file    Emotionally abused: Not on file    Physically abused: Not on file    Forced sexual activity: Not on file  Other Topics Concern  . Not on file  Social History Narrative  . Not on file    Vital Signs: Blood pressure 132/68, pulse 88, resp. rate 16, height 5\' 8"  (1.727 m), weight 226 lb 12.8 oz (102.9 kg), SpO2 96 %.  Examination: General Appearance: The patient is well-developed, well-nourished, and in no distress. Skin: Gross inspection of skin unremarkable. Head: normocephalic, no gross deformities. Eyes: no gross deformities noted. ENT: ears appear grossly normal no exudates. Neck: Supple. No thyromegaly. No LAD. Respiratory: scattered rhonchi. Cardiovascular: Normal S1 and S2 without murmur or rub. Extremities: No cyanosis. pulses are equal. Neurologic: Alert and oriented. No involuntary movements.  LABS: No results found for this or any previous visit (from the past 2160 hour(s)).  Radiology: Dg Chest 2 View  Result Date: 02/24/2018 CLINICAL DATA:  67 year old male with respiratory distress. EXAM: CHEST - 2 VIEW COMPARISON:  Chest radiograph dated 08/31/2009 FINDINGS: The heart size and mediastinal contours are within normal limits. Both lungs are clear. The visualized skeletal structures are unremarkable. IMPRESSION: No active cardiopulmonary disease. Electronically Signed   By: Anner Crete M.D.   On: 02/24/2018 00:24    No results found.  Dg Chest 2 View  Result Date: 02/24/2018 CLINICAL DATA:  66 year old male with respiratory distress. EXAM: CHEST - 2 VIEW COMPARISON:  Chest radiograph dated 08/31/2009 FINDINGS: The heart size and  mediastinal contours are within normal limits. Both lungs are clear. The visualized skeletal structures are unremarkable. IMPRESSION: No active cardiopulmonary disease. Electronically Signed   By: Anner Crete M.D.   On: 02/24/2018 00:24      Assessment and Plan: Patient Active Problem List   Diagnosis Date Noted  . Benign paroxysmal positional vertigo 11/30/2016  . Chronic midline low back pain without sciatica 10/07/2015  . Type 2 diabetes mellitus, controlled ([place]) 11/26/2014  . COPD, mild ([place]) 01/29/2014  . Sleep apnea 01/29/2014  . Hyperlipemia 01/16/2014  . Hypertension 01/16/2014    1. COPD now states he is doing better less shortness of breath.  We will continue with present management. 2. OSA  Once again compliance was discussed in length patient is going to try to be more compliant  with his CPAP device 3. ETOH abuse no longer drinking.  He is going to abstain states that down knee at 1 drink in July 4th but nothing otherwise since March of this year 4. Obesity exercise discussed.  Needs to continue with the dietary management an exercise  General Counseling: I have discussed the findings of the evaluation and examination with Annie Main.  I have also discussed any further diagnostic evaluation thatmay be needed or ordered today. [place] verbalizes understanding of the findings of todays visit. We also reviewed his medications today and discussed drug interactions and side effects including but not limited excessive drowsiness and altered mental states. We also discussed that there is always a risk not just to him but also people around him. he has been encouraged to call the office with any questions or concerns that should arise related to todays visit.    Time spent: 46min  I have personally obtained a history, examined the patient, evaluated laboratory and imaging results, formulated the assessment and plan and placed orders.    [name], MD [facility] Pulmonary and  Critical Care Sleep medicine

## 2018-03-20 ENCOUNTER — Ambulatory Visit: Payer: Self-pay

## 2018-04-04 ENCOUNTER — Other Ambulatory Visit: Payer: Self-pay | Admitting: Internal Medicine

## 2018-04-04 DIAGNOSIS — J449 Chronic obstructive pulmonary disease, unspecified: Secondary | ICD-10-CM

## 2018-04-08 DIAGNOSIS — Z125 Encounter for screening for malignant neoplasm of prostate: Secondary | ICD-10-CM | POA: Diagnosis not present

## 2018-04-08 DIAGNOSIS — M25511 Pain in right shoulder: Secondary | ICD-10-CM | POA: Diagnosis not present

## 2018-04-08 DIAGNOSIS — E7800 Pure hypercholesterolemia, unspecified: Secondary | ICD-10-CM | POA: Diagnosis not present

## 2018-04-08 DIAGNOSIS — I10 Essential (primary) hypertension: Secondary | ICD-10-CM | POA: Diagnosis not present

## 2018-04-08 DIAGNOSIS — E119 Type 2 diabetes mellitus without complications: Secondary | ICD-10-CM | POA: Diagnosis not present

## 2018-04-08 DIAGNOSIS — G4733 Obstructive sleep apnea (adult) (pediatric): Secondary | ICD-10-CM | POA: Diagnosis not present

## 2018-04-18 DIAGNOSIS — J449 Chronic obstructive pulmonary disease, unspecified: Secondary | ICD-10-CM | POA: Diagnosis not present

## 2018-04-18 DIAGNOSIS — I10 Essential (primary) hypertension: Secondary | ICD-10-CM | POA: Diagnosis not present

## 2018-04-18 DIAGNOSIS — G4733 Obstructive sleep apnea (adult) (pediatric): Secondary | ICD-10-CM | POA: Diagnosis not present

## 2018-04-18 DIAGNOSIS — E119 Type 2 diabetes mellitus without complications: Secondary | ICD-10-CM | POA: Diagnosis not present

## 2018-05-29 DIAGNOSIS — J310 Chronic rhinitis: Secondary | ICD-10-CM | POA: Diagnosis not present

## 2018-05-29 DIAGNOSIS — E119 Type 2 diabetes mellitus without complications: Secondary | ICD-10-CM | POA: Diagnosis not present

## 2018-05-29 DIAGNOSIS — K625 Hemorrhage of anus and rectum: Secondary | ICD-10-CM | POA: Diagnosis not present

## 2018-06-05 ENCOUNTER — Ambulatory Visit: Payer: Self-pay

## 2018-06-11 DIAGNOSIS — K625 Hemorrhage of anus and rectum: Secondary | ICD-10-CM | POA: Diagnosis not present

## 2018-06-11 DIAGNOSIS — Z86010 Personal history of colonic polyps: Secondary | ICD-10-CM | POA: Diagnosis not present

## 2018-06-12 DIAGNOSIS — K625 Hemorrhage of anus and rectum: Secondary | ICD-10-CM | POA: Diagnosis not present

## 2018-06-18 DIAGNOSIS — R413 Other amnesia: Secondary | ICD-10-CM | POA: Diagnosis not present

## 2018-06-18 DIAGNOSIS — G4733 Obstructive sleep apnea (adult) (pediatric): Secondary | ICD-10-CM | POA: Diagnosis not present

## 2018-06-18 DIAGNOSIS — E669 Obesity, unspecified: Secondary | ICD-10-CM | POA: Diagnosis not present

## 2018-06-18 DIAGNOSIS — H8110 Benign paroxysmal vertigo, unspecified ear: Secondary | ICD-10-CM | POA: Diagnosis not present

## 2018-07-09 ENCOUNTER — Ambulatory Visit: Payer: BLUE CROSS/BLUE SHIELD | Admitting: Internal Medicine

## 2018-07-09 ENCOUNTER — Encounter: Payer: Self-pay | Admitting: Internal Medicine

## 2018-07-09 VITALS — BP 120/70 | HR 99 | Resp 16 | Ht 68.0 in | Wt 222.0 lb

## 2018-07-09 DIAGNOSIS — J449 Chronic obstructive pulmonary disease, unspecified: Secondary | ICD-10-CM

## 2018-07-09 DIAGNOSIS — R0602 Shortness of breath: Secondary | ICD-10-CM | POA: Diagnosis not present

## 2018-07-09 DIAGNOSIS — G4733 Obstructive sleep apnea (adult) (pediatric): Secondary | ICD-10-CM | POA: Diagnosis not present

## 2018-07-09 DIAGNOSIS — Z01818 Encounter for other preprocedural examination: Secondary | ICD-10-CM

## 2018-07-09 DIAGNOSIS — Z9989 Dependence on other enabling machines and devices: Secondary | ICD-10-CM

## 2018-07-09 NOTE — Patient Instructions (Signed)

## 2018-07-09 NOTE — Progress Notes (Signed)
[facility] [place], [place] [address]  Pulmonary Sleep Medicine   Office Visit Note  Patient Name: Charles Mata DOB: [birth year] MRN [id number]  Date of Service: 07/09/2018  Complaints/HPI: Pt is here for colonoscopy procedural clearance.  Patient is in need of colonoscopy in his GI doctor sent him back to [place] for clearance to have the procedure performed.  Patient has a history of OSA as well as COPD.  His COPD is currently moderately controlled.  He denies any increased symptoms as of late.  His compliance with his CPAP is in question but reports that he has been doing much better recently.     ROS  General: (-) fever, (-) chills, (-) night sweats, (-) weakness Skin: (-) rashes, (-) itching,. Eyes: (-) visual changes, (-) redness, (-) itching. Nose and Sinuses: (-) nasal stuffiness or itchiness, (-) postnasal drip, (-) nosebleeds, (-) sinus trouble. Mouth and Throat: (-) sore throat, (-) hoarseness. Neck: (-) swollen glands, (-) enlarged thyroid, (-) neck pain. Respiratory: - cough, (-) bloody sputum, - shortness of breath, - wheezing. Cardiovascular: - ankle swelling, (-) chest pain. Lymphatic: (-) lymph node enlargement. Neurologic: (-) numbness, (-) tingling. Psychiatric: (-) anxiety, (-) depression   Current Medication: Outpatient Encounter Medications as of 07/09/2018  Medication Sig  . aspirin EC 81 MG tablet Take by mouth.  Marland Kitchen BREO ELLIPTA 100-25 MCG/INH AEPB TAKE 1 PUFF BY MOUTH EVERY DAY  . CVS VITAMIN B12 1000 MCG tablet TAKE 1 TABLET (1,000 MCG TOTAL) BY MOUTH ONCE DAILY.  . furosemide (LASIX) 40 MG tablet TAKE 1 TABLET (40 MG TOTAL) BY MOUTH ONCE DAILY.  Marland Kitchen glucose blood (CONTOUR NEXT TEST) test strip   . Ipratropium-Albuterol (COMBIVENT RESPIMAT) 20-100 MCG/ACT AERS respimat INHALE 1 PUFF BY MOUTH 4 TIMES A DAY AS NEEDED  . lisinopril (PRINIVIL,ZESTRIL) 5 MG tablet Take by mouth.  . montelukast (SINGULAIR) 10 MG tablet TAKE 1 TABLET  BY MOUTH ONCE DAILY  . omeprazole (PRILOSEC) 40 MG capsule TAKE 1 CAPSULE BY MOUTH EVERY DAY  . oxymetazoline (NASAL RELIEF) 0.05 % nasal spray Place into the nose.  Marland Kitchen PROCTOSOL HC 2.5 % rectal cream APPLY TO AFFECTED AREA 3 TIMES A DAY  . rosuvastatin (CRESTOR) 10 MG tablet Take 5 mg by mouth daily.  . tamsulosin (FLOMAX) 0.4 MG CAPS capsule TAKE ONE CAPSULE BY MOUTH EVERY DAY 1/2 HOUR AFTER A MEAL  . Vitamin D, Ergocalciferol, (DRISDOL) 50000 units CAPS capsule TAKE ONE CAPSULE BY MOUTH ONE TIME PER WEEK  . glyBURIDE (DIABETA) 5 MG tablet TAKE 1 TABLET BY MOUTH EVERY DAY WITH BREAKFAST   No facility-administered encounter medications on file as of 07/09/2018.     Surgical History: Past Surgical History:  Procedure Laterality Date  . COLONOSCOPY    . heart catheriation      Medical History: Past Medical History:  Diagnosis Date  . Asthma   . Diabetes ([place])   . Hyperlipidemia   . Hypertension   . Sickle cell trait ([place])   . Sleep apnea     Family History: History reviewed. No pertinent family history.  Social History: Social History   Socioeconomic History  . Marital status: Widowed    Spouse name: Not on file  . Number of children: Not on file  . Years of education: Not on file  . Highest education level: Not on file  Occupational History  . Not on file  Social Needs  . Financial resource strain: Not on file  .  Food insecurity:    Worry: Not on file    Inability: Not on file  . Transportation needs:    Medical: Not on file    Non-medical: Not on file  Tobacco Use  . Smoking status: Former Research scientist (life sciences)  . Smokeless tobacco: Never Used  Substance and Sexual Activity  . Alcohol use: Not Currently    Alcohol/week: 3.0 standard drinks    Types: 3 Glasses of wine per week    Frequency: Never    Comment: a night  . Drug use: No  . Sexual activity: Not on file  Lifestyle  . Physical activity:    Days per week: Not on file    Minutes per session: Not on file  .  Stress: Not on file  Relationships  . Social connections:    Talks on phone: Not on file    Gets together: Not on file    Attends religious service: Not on file    Active member of club or organization: Not on file    Attends meetings of clubs or organizations: Not on file    Relationship status: Not on file  . Intimate partner violence:    Fear of current or ex partner: Not on file    Emotionally abused: Not on file    Physically abused: Not on file    Forced sexual activity: Not on file  Other Topics Concern  . Not on file  Social History Narrative  . Not on file    Vital Signs: Blood pressure 120/70, pulse 99, resp. rate 16, height 5\' 8"  (1.727 m), weight 222 lb (100.7 kg), SpO2 96 %.  Examination: General Appearance: The patient is well-developed, well-nourished, and in no distress. Skin: Gross inspection of skin unremarkable. Head: normocephalic, no gross deformities. Eyes: no gross deformities noted. ENT: ears appear grossly normal no exudates. Neck: Supple. No thyromegaly. No LAD. Respiratory: clear bilateraly. Cardiovascular: Normal S1 and S2 without murmur or rub. Extremities: No cyanosis. pulses are equal. Neurologic: Alert and oriented. No involuntary movements.  LABS: No results found for this or any previous visit (from the past 2160 hour(s)).  Radiology: Dg Chest 2 View  Result Date: 02/24/2018 CLINICAL DATA:  67 year old male with respiratory distress. EXAM: CHEST - 2 VIEW COMPARISON:  Chest radiograph dated 08/31/2009 FINDINGS: The heart size and mediastinal contours are within normal limits. Both lungs are clear. The visualized skeletal structures are unremarkable. IMPRESSION: No active cardiopulmonary disease. Electronically Signed   By: Anner Crete M.D.   On: 02/24/2018 00:24    No results found.  No results found.    Assessment and Plan: Patient Active Problem List   Diagnosis Date Noted  . Benign paroxysmal positional vertigo 11/30/2016   . Chronic midline low back pain without sciatica 10/07/2015  . Type 2 diabetes mellitus, controlled ([place]) 11/26/2014  . COPD, mild ([place]) 01/29/2014  . Sleep apnea 01/29/2014  . Hyperlipemia 01/16/2014  . Hypertension 01/16/2014    1. Pre-operative clearance   2. OSA on CPAP Reiterated the importance of CPAP compliance nightly.  He reports he has been doing much better as of the recent weeks.  We again discussed endorgan damage with uncontrolled OSA.  3. Obstructive chronic bronchitis without exacerbation ([place]) Patient is shortness of breath and COPD his symptoms have improved.  Continue with present management.  4. Shortness of breath - Spirometry with Graph  General Counseling: I have discussed the findings of the evaluation and examination with Annie Main.  I have also discussed any  further diagnostic evaluation thatmay be needed or ordered today. Nicki verbalizes understanding of the findings of todays visit. We also reviewed his medications today and discussed drug interactions and side effects including but not limited excessive drowsiness and altered mental states. We also discussed that there is always a risk not just to him but also people around him. he has been encouraged to call the office with any questions or concerns that should arise related to todays visit.    Time spent: 25 This patient was seen by Orson Gear AGNP-C in Collaboration with Dr. Devona Konig as a part of collaborative care agreement.   I have personally obtained a history, examined the patient, evaluated laboratory and imaging results, formulated the assessment and plan and placed orders.    [name], MD [facility] Pulmonary and Critical Care Sleep medicine

## 2018-08-01 DIAGNOSIS — M25511 Pain in right shoulder: Secondary | ICD-10-CM | POA: Diagnosis not present

## 2018-08-01 DIAGNOSIS — M7541 Impingement syndrome of right shoulder: Secondary | ICD-10-CM | POA: Diagnosis not present

## 2018-08-01 DIAGNOSIS — M19011 Primary osteoarthritis, right shoulder: Secondary | ICD-10-CM | POA: Diagnosis not present

## 2018-08-01 DIAGNOSIS — M7551 Bursitis of right shoulder: Secondary | ICD-10-CM | POA: Diagnosis not present

## 2018-09-28 DIAGNOSIS — J0100 Acute maxillary sinusitis, unspecified: Secondary | ICD-10-CM | POA: Diagnosis not present

## 2018-10-01 ENCOUNTER — Ambulatory Visit: Payer: BLUE CROSS/BLUE SHIELD | Admitting: Internal Medicine

## 2018-10-01 ENCOUNTER — Other Ambulatory Visit: Payer: Self-pay | Admitting: Internal Medicine

## 2018-10-01 ENCOUNTER — Encounter: Payer: Self-pay | Admitting: Internal Medicine

## 2018-10-01 VITALS — BP 118/74 | HR 89 | Resp 16 | Ht 68.0 in | Wt 225.0 lb

## 2018-10-01 DIAGNOSIS — J449 Chronic obstructive pulmonary disease, unspecified: Secondary | ICD-10-CM

## 2018-10-01 DIAGNOSIS — Z9989 Dependence on other enabling machines and devices: Secondary | ICD-10-CM

## 2018-10-01 DIAGNOSIS — I10 Essential (primary) hypertension: Secondary | ICD-10-CM

## 2018-10-01 DIAGNOSIS — R0602 Shortness of breath: Secondary | ICD-10-CM

## 2018-10-01 DIAGNOSIS — G4733 Obstructive sleep apnea (adult) (pediatric): Secondary | ICD-10-CM

## 2018-10-01 NOTE — Patient Instructions (Signed)

## 2018-10-01 NOTE — Progress Notes (Signed)
[facility] [place], [place] [address]  Pulmonary Sleep Medicine   Office Visit Note  Patient Name: Charles Mata DOB: 10/31/1950 MRN [id number]  Date of Service: 10/01/2018  Complaints/HPI: Pt is here for follow up on COPD and OSA.  He reports he is doing well.  He reports he stopped taking his Breo, because it was making him hoarse.  He continues to use his albuterol inhaler as needed.  His spiro today shows good results. He has been using his CPAP, although he reports sometimes he forgets to put it on.     ROS  General: (-) fever, (-) chills, (-) night sweats, (-) weakness Skin: (-) rashes, (-) itching,. Eyes: (-) visual changes, (-) redness, (-) itching. Nose and Sinuses: (-) nasal stuffiness or itchiness, (-) postnasal drip, (-) nosebleeds, (-) sinus trouble. Mouth and Throat: (-) sore throat, (-) hoarseness. Neck: (-) swollen glands, (-) enlarged thyroid, (-) neck pain. Respiratory: - cough, (-) bloody sputum, - shortness of breath, - wheezing. Cardiovascular: - ankle swelling, (-) chest pain. Lymphatic: (-) lymph node enlargement. Neurologic: (-) numbness, (-) tingling. Psychiatric: (-) anxiety, (-) depression   Current Medication: Outpatient Encounter Medications as of 10/01/2018  Medication Sig  . amoxicillin-clavulanate (AUGMENTIN) 875-125 MG tablet   . aspirin EC 81 MG tablet Take by mouth.  . CVS VITAMIN B12 1000 MCG tablet TAKE 1 TABLET (1,000 MCG TOTAL) BY MOUTH ONCE DAILY.  . furosemide (LASIX) 40 MG tablet TAKE 1 TABLET (40 MG TOTAL) BY MOUTH ONCE DAILY.  Marland Kitchen glucose blood (CONTOUR NEXT TEST) test strip   . glyBURIDE (DIABETA) 5 MG tablet TAKE 1 TABLET BY MOUTH EVERY DAY WITH BREAKFAST  . Ipratropium-Albuterol (COMBIVENT RESPIMAT) 20-100 MCG/ACT AERS respimat INHALE 1 PUFF BY MOUTH 4 TIMES A DAY AS NEEDED  . lisinopril (PRINIVIL,ZESTRIL) 5 MG tablet Take by mouth.  . montelukast (SINGULAIR) 10 MG tablet TAKE 1 TABLET BY MOUTH ONCE  DAILY  . omeprazole (PRILOSEC) 40 MG capsule TAKE 1 CAPSULE BY MOUTH EVERY DAY  . oxymetazoline (NASAL RELIEF) 0.05 % nasal spray Place into the nose.  Marland Kitchen PROCTOSOL HC 2.5 % rectal cream APPLY TO AFFECTED AREA 3 TIMES A DAY  . rosuvastatin (CRESTOR) 10 MG tablet Take 5 mg by mouth daily.  . tamsulosin (FLOMAX) 0.4 MG CAPS capsule TAKE ONE CAPSULE BY MOUTH EVERY DAY 1/2 HOUR AFTER A MEAL  . Vitamin D, Ergocalciferol, (DRISDOL) 50000 units CAPS capsule TAKE ONE CAPSULE BY MOUTH ONE TIME PER WEEK  . BREO ELLIPTA 100-25 MCG/INH AEPB TAKE 1 PUFF BY MOUTH EVERY DAY (Patient not taking: Reported on 10/01/2018)   No facility-administered encounter medications on file as of 10/01/2018.     Surgical History: Past Surgical History:  Procedure Laterality Date  . COLONOSCOPY    . heart catheriation      Medical History: Past Medical History:  Diagnosis Date  . Asthma   . Diabetes ([place])   . Hyperlipidemia   . Hypertension   . Sickle cell trait ([place])   . Sleep apnea     Family History: No family history on file.  Social History: Social History   Socioeconomic History  . Marital status: Widowed    Spouse name: Not on file  . Number of children: Not on file  . Years of education: Not on file  . Highest education level: Not on file  Occupational History  . Not on file  Social Needs  . Financial resource strain: Not on file  .  Food insecurity:    Worry: Not on file    Inability: Not on file  . Transportation needs:    Medical: Not on file    Non-medical: Not on file  Tobacco Use  . Smoking status: Former Research scientist (life sciences)  . Smokeless tobacco: Never Used  Substance and Sexual Activity  . Alcohol use: Not Currently    Alcohol/week: 3.0 standard drinks    Types: 3 Glasses of wine per week    Frequency: Never    Comment: a night  . Drug use: No  . Sexual activity: Not on file  Lifestyle  . Physical activity:    Days per week: Not on file    Minutes per session: Not on file  . Stress:  Not on file  Relationships  . Social connections:    Talks on phone: Not on file    Gets together: Not on file    Attends religious service: Not on file    Active member of club or organization: Not on file    Attends meetings of clubs or organizations: Not on file    Relationship status: Not on file  . Intimate partner violence:    Fear of current or ex partner: Not on file    Emotionally abused: Not on file    Physically abused: Not on file    Forced sexual activity: Not on file  Other Topics Concern  . Not on file  Social History Narrative  . Not on file    Vital Signs: Blood pressure 118/74, pulse 89, resp. rate 16, height 5\' 8"  (1.727 m), weight 225 lb (102.1 kg), SpO2 96 %.  Examination: General Appearance: The patient is well-developed, well-nourished, and in no distress. Skin: Gross inspection of skin unremarkable. Head: normocephalic, no gross deformities. Eyes: no gross deformities noted. ENT: ears appear grossly normal no exudates. Neck: Supple. No thyromegaly. No LAD. Respiratory: clear bilaterally. Cardiovascular: Normal S1 and S2 without murmur or rub. Extremities: No cyanosis. pulses are equal. Neurologic: Alert and oriented. No involuntary movements.  LABS: No results found for this or any previous visit (from the past 2160 hour(s)).  Radiology: Dg Chest 2 View  Result Date: 02/24/2018 CLINICAL DATA:  68 year old male with respiratory distress. EXAM: CHEST - 2 VIEW COMPARISON:  Chest radiograph dated 08/31/2009 FINDINGS: The heart size and mediastinal contours are within normal limits. Both lungs are clear. The visualized skeletal structures are unremarkable. IMPRESSION: No active cardiopulmonary disease. Electronically Signed   By: Anner Crete M.D.   On: 02/24/2018 00:24    No results found.  No results found.    Assessment and Plan: Patient Active Problem List   Diagnosis Date Noted  . Benign paroxysmal positional vertigo 11/30/2016  .  Chronic midline low back pain without sciatica 10/07/2015  . Type 2 diabetes mellitus, controlled ([place]) 11/26/2014  . COPD, mild ([place]) 01/29/2014  . Sleep apnea 01/29/2014  . Hyperlipemia 01/16/2014  . Hypertension 01/16/2014   1. OSA on CPAP Encourage continued compliance with CPAP.  Patient verbalized understanding states that he wears it nightly unless he falls asleep without putting it on.  He reports he only does that occasionally because he feels great when he wakes up after wearing.  2. Obstructive chronic bronchitis without exacerbation ([place]) Stable, continue current medications as prescribed.  Patient does not wish to continue to take [facility] and reports that his breathing has been doing well without it.  Will discuss at next visit another option for medication if patient needs it.  3. Hypertension, unspecified type Stable, continue current medication regimen.  4. Morbid obesity ([place]) Obesity Counseling: Risk Assessment: An assessment of behavioral risk factors was made today and includes lack of exercise sedentary lifestyle, lack of portion control and poor dietary habits.  Risk Modification Advice: She was counseled on portion control guidelines. Restricting daily caloric intake to. . The detrimental long term effects of obesity on her health and ongoing poor compliance was also discussed with the patient.  5. SOB (shortness of breath) Patient spirometry is very good at this visit. FEV1 is 3.4 which is 93% of the pre-predicted value FEV1 is 2.8 which is 100% of the pre-predicted value FEV1/FVC is 82% which is 106% of the pre-predicted value on today's spirometry. - Spirometry with Graph  General Counseling: I have discussed the findings of the evaluation and examination with Annie Main.  I have also discussed any further diagnostic evaluation thatmay be needed or ordered today. Javyon verbalizes understanding of the findings of todays visit. We also reviewed his medications today  and discussed drug interactions and side effects including but not limited excessive drowsiness and altered mental states. We also discussed that there is always a risk not just to him but also people around him. he has been encouraged to call the office with any questions or concerns that should arise related to todays visit.    Time spent: 25 This patient was seen by Orson Gear AGNP-C in Collaboration with Dr. Devona Konig as a part of collaborative care agreement.   I have personally obtained a history, examined the patient, evaluated laboratory and imaging results, formulated the assessment and plan and placed orders.    [name], MD [facility] Pulmonary and Critical Care Sleep medicine

## 2018-10-08 ENCOUNTER — Ambulatory Visit: Payer: Self-pay | Admitting: Internal Medicine

## 2018-10-11 ENCOUNTER — Encounter: Admission: RE | Payer: Self-pay | Source: Home / Self Care

## 2018-10-11 ENCOUNTER — Ambulatory Visit
Admission: RE | Admit: 2018-10-11 | Payer: BLUE CROSS/BLUE SHIELD | Source: Home / Self Care | Admitting: Unknown Physician Specialty

## 2018-10-11 SURGERY — COLONOSCOPY WITH PROPOFOL
Anesthesia: General

## 2018-10-21 DIAGNOSIS — G4733 Obstructive sleep apnea (adult) (pediatric): Secondary | ICD-10-CM | POA: Diagnosis not present

## 2018-10-21 DIAGNOSIS — J449 Chronic obstructive pulmonary disease, unspecified: Secondary | ICD-10-CM | POA: Diagnosis not present

## 2018-10-21 DIAGNOSIS — E119 Type 2 diabetes mellitus without complications: Secondary | ICD-10-CM | POA: Diagnosis not present

## 2018-10-21 DIAGNOSIS — I10 Essential (primary) hypertension: Secondary | ICD-10-CM | POA: Diagnosis not present

## 2018-10-21 DIAGNOSIS — E7800 Pure hypercholesterolemia, unspecified: Secondary | ICD-10-CM | POA: Diagnosis not present

## 2018-10-22 DIAGNOSIS — G8929 Other chronic pain: Secondary | ICD-10-CM | POA: Diagnosis not present

## 2018-10-22 DIAGNOSIS — Z0000 Encounter for general adult medical examination without abnormal findings: Secondary | ICD-10-CM | POA: Diagnosis not present

## 2018-10-22 DIAGNOSIS — J449 Chronic obstructive pulmonary disease, unspecified: Secondary | ICD-10-CM | POA: Diagnosis not present

## 2018-10-22 DIAGNOSIS — M25511 Pain in right shoulder: Secondary | ICD-10-CM | POA: Diagnosis not present

## 2018-10-22 DIAGNOSIS — I10 Essential (primary) hypertension: Secondary | ICD-10-CM | POA: Diagnosis not present

## 2018-11-02 ENCOUNTER — Other Ambulatory Visit: Payer: Self-pay | Admitting: Internal Medicine

## 2018-11-02 DIAGNOSIS — J449 Chronic obstructive pulmonary disease, unspecified: Secondary | ICD-10-CM

## 2018-11-26 ENCOUNTER — Other Ambulatory Visit: Payer: Self-pay | Admitting: Adult Health

## 2018-11-26 MED ORDER — IPRATROPIUM-ALBUTEROL 0.5-2.5 (3) MG/3ML IN SOLN: 3.0000 mL | RESPIRATORY_TRACT | 1 refills | Status: AC | PRN

## 2018-11-29 DIAGNOSIS — J449 Chronic obstructive pulmonary disease, unspecified: Secondary | ICD-10-CM | POA: Diagnosis not present

## 2018-11-29 DIAGNOSIS — R05 Cough: Secondary | ICD-10-CM | POA: Diagnosis not present

## 2018-11-29 DIAGNOSIS — R0602 Shortness of breath: Secondary | ICD-10-CM | POA: Diagnosis not present

## 2018-11-29 DIAGNOSIS — R5381 Other malaise: Secondary | ICD-10-CM | POA: Diagnosis not present

## 2018-12-17 DIAGNOSIS — H8110 Benign paroxysmal vertigo, unspecified ear: Secondary | ICD-10-CM | POA: Diagnosis not present

## 2018-12-23 ENCOUNTER — Other Ambulatory Visit: Payer: Self-pay

## 2018-12-23 ENCOUNTER — Ambulatory Visit: Payer: BLUE CROSS/BLUE SHIELD | Admitting: Adult Health

## 2018-12-23 ENCOUNTER — Encounter: Payer: Self-pay | Admitting: Adult Health

## 2018-12-23 VITALS — BP 114/82 | HR 78 | Resp 16 | Ht 68.0 in | Wt 226.0 lb

## 2018-12-23 DIAGNOSIS — J449 Chronic obstructive pulmonary disease, unspecified: Secondary | ICD-10-CM | POA: Diagnosis not present

## 2018-12-23 DIAGNOSIS — R0602 Shortness of breath: Secondary | ICD-10-CM | POA: Diagnosis not present

## 2018-12-23 DIAGNOSIS — Z9989 Dependence on other enabling machines and devices: Secondary | ICD-10-CM

## 2018-12-23 DIAGNOSIS — I10 Essential (primary) hypertension: Secondary | ICD-10-CM

## 2018-12-23 DIAGNOSIS — G4733 Obstructive sleep apnea (adult) (pediatric): Secondary | ICD-10-CM | POA: Diagnosis not present

## 2018-12-23 NOTE — Progress Notes (Signed)
[facility] [place], [place] [address]  Pulmonary Sleep Medicine   Office Visit Note  Patient Name: Charles Mata DOB: 11/03/1950 MRN [id number]  Date of Service: 12/23/2018  Complaints/HPI: Pt reports for the last few weeks he had some persistent SOB, Coughing, and wheezing.  He denies any fever.  He reports he has been at home a lot due to Covid-19 restrictions.  He states his symptoms have improved significantly since he bought an air purifier two days ago.  He has been using his combivent inhaler and his nebulizer.       ROS  General: (-) fever, (-) chills, (-) night sweats, (-) weakness Skin: (-) rashes, (-) itching,. Eyes: (-) visual changes, (-) redness, (-) itching. Nose and Sinuses: (-) nasal stuffiness or itchiness, (-) postnasal drip, (-) nosebleeds, (-) sinus trouble. Mouth and Throat: (-) sore throat, (-) hoarseness. Neck: (-) swollen glands, (-) enlarged thyroid, (-) neck pain. Respiratory: - cough, (-) bloody sputum, - shortness of breath, - wheezing. Cardiovascular: - ankle swelling, (-) chest pain. Lymphatic: (-) lymph node enlargement. Neurologic: (-) numbness, (-) tingling. Psychiatric: (-) anxiety, (-) depression   Current Medication: Outpatient Encounter Medications as of 12/23/2018  Medication Sig  . aspirin EC 81 MG tablet Take by mouth.  . CVS VITAMIN B12 1000 MCG tablet TAKE 1 TABLET (1,000 MCG TOTAL) BY MOUTH ONCE DAILY.  . furosemide (LASIX) 40 MG tablet TAKE 1 TABLET (40 MG TOTAL) BY MOUTH ONCE DAILY.  Marland Kitchen glucose blood (CONTOUR NEXT TEST) test strip   . glyBURIDE (DIABETA) 5 MG tablet TAKE 1 TABLET BY MOUTH EVERY DAY WITH BREAKFAST  . Ipratropium-Albuterol (COMBIVENT RESPIMAT) 20-100 MCG/ACT AERS respimat INHALE 1 PUFF BY MOUTH 4 TIMES A DAY AS NEEDED  . ipratropium-albuterol (DUONEB) 0.5-2.5 (3) MG/3ML SOLN Take 3 mLs by nebulization every 4 (four) hours as needed.  Marland Kitchen lisinopril (PRINIVIL,ZESTRIL) 5 MG tablet Take by  mouth.  . montelukast (SINGULAIR) 10 MG tablet TAKE 1 TABLET BY MOUTH ONCE DAILY  . omeprazole (PRILOSEC) 40 MG capsule TAKE 1 CAPSULE BY MOUTH EVERY DAY  . oxymetazoline (NASAL RELIEF) 0.05 % nasal spray Place into the nose.  Marland Kitchen PROCTOSOL HC 2.5 % rectal cream APPLY TO AFFECTED AREA 3 TIMES A DAY  . rosuvastatin (CRESTOR) 10 MG tablet Take 5 mg by mouth daily.  . tamsulosin (FLOMAX) 0.4 MG CAPS capsule TAKE ONE CAPSULE BY MOUTH EVERY DAY 1/2 HOUR AFTER A MEAL  . Vitamin D, Ergocalciferol, (DRISDOL) 50000 units CAPS capsule TAKE ONE CAPSULE BY MOUTH ONE TIME PER WEEK  . BREO ELLIPTA 100-25 MCG/INH AEPB TAKE 1 PUFF BY MOUTH EVERY DAY (Patient not taking: Reported on 12/23/2018)  . [DISCONTINUED] amoxicillin-clavulanate (AUGMENTIN) 875-125 MG tablet    No facility-administered encounter medications on file as of 12/23/2018.     Surgical History: Past Surgical History:  Procedure Laterality Date  . COLONOSCOPY    . heart catheriation      Medical History: Past Medical History:  Diagnosis Date  . Asthma   . Diabetes ([place])   . Hyperlipidemia   . Hypertension   . Sickle cell trait ([place])   . Sleep apnea     Family History: No family history on file.  Social History: Social History   Socioeconomic History  . Marital status: Widowed    Spouse name: Not on file  . Number of children: Not on file  . Years of education: Not on file  . Highest education level: Not on file  Occupational History  . Not on file  Social Needs  . Financial resource strain: Not on file  . Food insecurity:    Worry: Not on file    Inability: Not on file  . Transportation needs:    Medical: Not on file    Non-medical: Not on file  Tobacco Use  . Smoking status: Former Research scientist (life sciences)  . Smokeless tobacco: Never Used  Substance and Sexual Activity  . Alcohol use: Not Currently    Alcohol/week: 3.0 standard drinks    Types: 3 Glasses of wine per week    Frequency: Never    Comment: a night  . Drug use: No   . Sexual activity: Not on file  Lifestyle  . Physical activity:    Days per week: Not on file    Minutes per session: Not on file  . Stress: Not on file  Relationships  . Social connections:    Talks on phone: Not on file    Gets together: Not on file    Attends religious service: Not on file    Active member of club or organization: Not on file    Attends meetings of clubs or organizations: Not on file    Relationship status: Not on file  . Intimate partner violence:    Fear of current or ex partner: Not on file    Emotionally abused: Not on file    Physically abused: Not on file    Forced sexual activity: Not on file  Other Topics Concern  . Not on file  Social History Narrative  . Not on file    Vital Signs: Blood pressure 114/82, pulse 78, resp. rate 16, height 5\' 8"  (1.727 m), weight 226 lb (102.5 kg), SpO2 97 %.  Examination: General Appearance: The patient is well-developed, well-nourished, and in no distress. Skin: Gross inspection of skin unremarkable. Head: normocephalic, no gross deformities. Eyes: no gross deformities noted. ENT: ears appear grossly normal no exudates. Neck: Supple. No thyromegaly. No LAD. Respiratory: clear bilateraly. Cardiovascular: Normal S1 and S2 without murmur or rub. Extremities: No cyanosis. pulses are equal. Neurologic: Alert and oriented. No involuntary movements.  LABS: No results found for this or any previous visit (from the past 2160 hour(s)).  Radiology: Dg Chest 2 View  Result Date: 02/24/2018 CLINICAL DATA:  68 year old male with respiratory distress. EXAM: CHEST - 2 VIEW COMPARISON:  Chest radiograph dated 08/31/2009 FINDINGS: The heart size and mediastinal contours are within normal limits. Both lungs are clear. The visualized skeletal structures are unremarkable. IMPRESSION: No active cardiopulmonary disease. Electronically Signed   By: Anner Crete M.D.   On: 02/24/2018 00:24    No results found.  No results  found.    Assessment and Plan: Patient Active Problem List   Diagnosis Date Noted  . Benign paroxysmal positional vertigo 11/30/2016  . Chronic midline low back pain without sciatica 10/07/2015  . Type 2 diabetes mellitus, controlled ([place]) 11/26/2014  . COPD, mild ([place]) 01/29/2014  . Sleep apnea 01/29/2014  . Hyperlipemia 01/16/2014  . Hypertension 01/16/2014    1. Obstructive chronic bronchitis without exacerbation ([place]) Continue to use combivent inhaler as prescribed. Pt no longer using breo inhaler so sample of Dulera 220mcg given to patient to try.  He will let us know if he has good results and we will send RX.     2. OSA on CPAP Continue to use cpap as directed and change mask seal and tubing as discussed.   3. SOB (shortness  of breath) - Spirometry with Graph  4. Hypertension, unspecified type Stable, continue present management.    General Counseling: I have discussed the findings of the evaluation and examination with Annie Main.  I have also discussed any further diagnostic evaluation thatmay be needed or ordered today. Sohum verbalizes understanding of the findings of todays visit. We also reviewed his medications today and discussed drug interactions and side effects including but not limited excessive drowsiness and altered mental states. We also discussed that there is always a risk not just to him but also people around him. he has been encouraged to call the office with any questions or concerns that should arise related to todays visit.    Time spent: 25 This patient was seen by Orson Gear AGNP-C in Collaboration with Dr. Devona Konig as a part of collaborative care agreement.   I have personally obtained a history, examined the patient, evaluated laboratory and imaging results, formulated the assessment and plan and placed orders.    [name], MD [facility] Pulmonary and Critical Care Sleep medicine

## 2018-12-23 NOTE — Patient Instructions (Signed)
Chronic Obstructive Pulmonary Disease Chronic obstructive pulmonary disease (COPD) is a long-term (chronic) lung problem. When you have COPD, it is hard for air to get in and out of your lungs. Usually the condition gets worse over time, and your lungs will never return to normal. There are things you can do to keep yourself as healthy as possible.  Your doctor may treat your condition with: ? Medicines. ? Oxygen. ? Lung surgery.  Your doctor may also recommend: ? Rehabilitation. This includes steps to make your body work better. It may involve a team of specialists. ? Quitting smoking, if you smoke. ? Exercise and changes to your diet. ? Comfort measures (palliative care). Follow these instructions at home: Medicines  Take over-the-counter and prescription medicines only as told by your doctor.  Talk to your doctor before taking any cough or allergy medicines. You may need to avoid medicines that cause your lungs to be dry. Lifestyle  If you smoke, stop. Smoking makes the problem worse. If you need help quitting, ask your doctor.  Avoid being around things that make your breathing worse. This may include smoke, chemicals, and fumes.  Stay active, but remember to rest as well.  Learn and use tips on how to relax.  Make sure you get enough sleep. Most adults need at least 7 hours of sleep every night.  Eat healthy foods. Eat smaller meals more often. Rest before meals. Controlled breathing Learn and use tips on how to control your breathing as told by your doctor. Try:  Breathing in (inhaling) through your nose for 1 second. Then, pucker your lips and breath out (exhale) through your lips for 2 seconds.  Putting one hand on your belly (abdomen). Breathe in slowly through your nose for 1 second. Your hand on your belly should move out. Pucker your lips and breathe out slowly through your lips. Your hand on your belly should move in as you breathe out.  Controlled coughing Learn  and use controlled coughing to clear mucus from your lungs. Follow these steps: 1. Lean your head a little forward. 2. Breathe in deeply. 3. Try to hold your breath for 3 seconds. 4. Keep your mouth slightly open while coughing 2 times. 5. Spit any mucus out into a tissue. 6. Rest and do the steps again 1 or 2 times as needed. General instructions  Make sure you get all the shots (vaccines) that your doctor recommends. Ask your doctor about a flu shot and a pneumonia shot.  Use oxygen therapy and pulmonary rehabilitation if told by your doctor. If you need home oxygen therapy, ask your doctor if you should buy a tool to measure your oxygen level (oximeter).  Make a COPD action plan with your doctor. This helps you to know what to do if you feel worse than usual.  Manage any other conditions you have as told by your doctor.  Avoid going outside when it is very hot, cold, or humid.  Avoid people who have a sickness you can catch (contagious).  Keep all follow-up visits as told by your doctor. This is important. Contact a doctor if:  You cough up more mucus than usual.  There is a change in the color or thickness of the mucus.  It is harder to breathe than usual.  Your breathing is faster than usual.  You have trouble sleeping.  You need to use your medicines more often than usual.  You have trouble doing your normal activities such as getting dressed   or walking around the house. Get help right away if:  You have shortness of breath while resting.  You have shortness of breath that stops you from: ? Being able to talk. ? Doing normal activities.  Your chest hurts for longer than 5 minutes.  Your skin color is more blue than usual.  Your pulse oximeter shows that you have low oxygen for longer than 5 minutes.  You have a fever.  You feel too tired to breathe normally. Summary  Chronic obstructive pulmonary disease (COPD) is a long-term lung problem.  The way your  lungs work will never return to normal. Usually the condition gets worse over time. There are things you can do to keep yourself as healthy as possible.  Take over-the-counter and prescription medicines only as told by your doctor.  If you smoke, stop. Smoking makes the problem worse. This information is not intended to replace advice given to you by your health care provider. Make sure you discuss any questions you have with your health care provider. Document Released: 01/17/2008 Document Revised: 09/04/2016 Document Reviewed: 09/04/2016 Elsevier Interactive Patient Education  2019 Elsevier Inc.  

## 2019-01-16 ENCOUNTER — Other Ambulatory Visit: Payer: Self-pay

## 2019-01-16 ENCOUNTER — Ambulatory Visit: Payer: PPO | Admitting: Internal Medicine

## 2019-01-16 ENCOUNTER — Encounter: Payer: Self-pay | Admitting: Internal Medicine

## 2019-01-16 VITALS — BP 142/86 | HR 78 | Temp 98.1°F | Resp 16 | Ht 68.0 in | Wt 229.0 lb

## 2019-01-16 DIAGNOSIS — G4733 Obstructive sleep apnea (adult) (pediatric): Secondary | ICD-10-CM | POA: Diagnosis not present

## 2019-01-16 DIAGNOSIS — J4520 Mild intermittent asthma, uncomplicated: Secondary | ICD-10-CM

## 2019-01-16 DIAGNOSIS — J3089 Other allergic rhinitis: Secondary | ICD-10-CM

## 2019-01-16 NOTE — Progress Notes (Signed)
[facility] [place], [place] [address]  Pulmonary Sleep Medicine   Office Visit Note  Patient Name: Charles Mata DOB: 08/30/1950 MRN [id number]  Date of Service: 01/16/2019  Complaints/HPI: Patient states that he has some mold in his house. Patient is having some allergy symtpoms. Patient states that he would like to be checked for allergies. He also had the house checked and was found to have significan findings. He is going to have this checked into. He states he had allergy tests done several years ago and did not go on any shots.   ROS  General: (-) fever, (-) chills, (-) night sweats, (-) weakness Skin: (-) rashes, (-) itching,. Eyes: (-) visual changes, (-) redness, (-) itching. Nose and Sinuses: (-) nasal stuffiness or itchiness, (-) postnasal drip, (-) nosebleeds, (-) sinus trouble. Mouth and Throat: (-) sore throat, (-) hoarseness. Neck: (-) swollen glands, (-) enlarged thyroid, (-) neck pain. Respiratory: + cough, (-) bloody sputum, + shortness of breath, - wheezing. Cardiovascular: - ankle swelling, (-) chest pain. Lymphatic: (-) lymph node enlargement. Neurologic: (-) numbness, (-) tingling. Psychiatric: (-) anxiety, (-) depression   Current Medication: Outpatient Encounter Medications as of 01/16/2019  Medication Sig  . aspirin EC 81 MG tablet Take by mouth.  . [facility] VITAMIN B12 1000 MCG tablet TAKE 1 TABLET (1,000 MCG TOTAL) BY MOUTH ONCE DAILY.  . furosemide (LASIX) 40 MG tablet TAKE 1 TABLET (40 MG TOTAL) BY MOUTH ONCE DAILY.  Marland Kitchen glucose blood (CONTOUR NEXT TEST) test strip   . glyBURIDE (DIABETA) 5 MG tablet TAKE 1 TABLET BY MOUTH EVERY DAY WITH BREAKFAST  . Ipratropium-Albuterol (COMBIVENT RESPIMAT) 20-100 MCG/ACT AERS respimat INHALE 1 PUFF BY MOUTH 4 TIMES A DAY AS NEEDED  . ipratropium-albuterol (DUONEB) 0.5-2.5 (3) MG/3ML SOLN Take 3 mLs by nebulization every 4 (four) hours as needed.  Marland Kitchen lisinopril (PRINIVIL,ZESTRIL) 5 MG tablet  Take by mouth.  . montelukast (SINGULAIR) 10 MG tablet TAKE 1 TABLET BY MOUTH ONCE DAILY  . omeprazole (PRILOSEC) 40 MG capsule TAKE 1 CAPSULE BY MOUTH EVERY DAY  . oxymetazoline (NASAL RELIEF) 0.05 % nasal spray Place into the nose.  Marland Kitchen PROCTOSOL HC 2.5 % rectal cream APPLY TO AFFECTED AREA 3 TIMES A DAY  . rosuvastatin (CRESTOR) 10 MG tablet Take 5 mg by mouth daily.  . tamsulosin (FLOMAX) 0.4 MG CAPS capsule TAKE ONE CAPSULE BY MOUTH EVERY DAY 1/2 HOUR AFTER A MEAL  . Vitamin D, Ergocalciferol, (DRISDOL) 50000 units CAPS capsule TAKE ONE CAPSULE BY MOUTH ONE TIME PER WEEK  . [DISCONTINUED] BREO ELLIPTA 100-25 MCG/INH AEPB TAKE 1 PUFF BY MOUTH EVERY DAY (Patient not taking: Reported on 12/23/2018)   No facility-administered encounter medications on file as of 01/16/2019.     Surgical History: Past Surgical History:  Procedure Laterality Date  . COLONOSCOPY    . heart catheriation      Medical History: Past Medical History:  Diagnosis Date  . Asthma   . Diabetes ([place])   . Hyperlipidemia   . Hypertension   . Sickle cell trait ([place])   . Sleep apnea     Family History: History reviewed. No pertinent family history.  Social History: Social History   Socioeconomic History  . Marital status: Widowed    Spouse name: Not on file  . Number of children: Not on file  . Years of education: Not on file  . Highest education level: Not on file  Occupational History  . Not on file  Social Needs  . Financial resource strain: Not on file  . Food insecurity:    Worry: Not on file    Inability: Not on file  . Transportation needs:    Medical: Not on file    Non-medical: Not on file  Tobacco Use  . Smoking status: Former Research scientist (life sciences)  . Smokeless tobacco: Never Used  Substance and Sexual Activity  . Alcohol use: Not Currently    Alcohol/week: 3.0 standard drinks    Types: 3 Glasses of wine per week    Frequency: Never    Comment: a night  . Drug use: No  . Sexual activity: Not on  file  Lifestyle  . Physical activity:    Days per week: Not on file    Minutes per session: Not on file  . Stress: Not on file  Relationships  . Social connections:    Talks on phone: Not on file    Gets together: Not on file    Attends religious service: Not on file    Active member of club or organization: Not on file    Attends meetings of clubs or organizations: Not on file    Relationship status: Not on file  . Intimate partner violence:    Fear of current or ex partner: Not on file    Emotionally abused: Not on file    Physically abused: Not on file    Forced sexual activity: Not on file  Other Topics Concern  . Not on file  Social History Narrative  . Not on file    Vital Signs: Blood pressure (!) 142/86, pulse 78, temperature 98.1 F (36.7 C), temperature source Oral, resp. rate 16, height 5\' 8"  (1.727 m), weight 229 lb (103.9 kg), SpO2 99 %.  Examination: General Appearance: The patient is well-developed, well-nourished, and in no distress. Skin: Gross inspection of skin unremarkable. Head: normocephalic, no gross deformities. Eyes: no gross deformities noted. ENT: ears appear grossly normal no exudates. Neck: Supple. No thyromegaly. No LAD. Respiratory: no rhonchi currently. Cardiovascular: Normal S1 and S2 without murmur or rub. Extremities: No cyanosis. pulses are equal. Neurologic: Alert and oriented. No involuntary movements.  LABS: No results found for this or any previous visit (from the past 2160 hour(s)).  Radiology: Dg Chest 2 View  Result Date: 02/24/2018 CLINICAL DATA:  68 year old male with respiratory distress. EXAM: CHEST - 2 VIEW COMPARISON:  Chest radiograph dated 08/31/2009 FINDINGS: The heart size and mediastinal contours are within normal limits. Both lungs are clear. The visualized skeletal structures are unremarkable. IMPRESSION: No active cardiopulmonary disease. Electronically Signed   By: Anner Crete M.D.   On: 02/24/2018 00:24     No results found.  No results found.    Assessment and Plan: Patient Active Problem List   Diagnosis Date Noted  . Benign paroxysmal positional vertigo 11/30/2016  . Chronic midline low back pain without sciatica 10/07/2015  . Type 2 diabetes mellitus, controlled ([place]) 11/26/2014  . COPD, mild ([place]) 01/29/2014  . Sleep apnea 01/29/2014  . Hyperlipemia 01/16/2014  . Hypertension 01/16/2014    1. Environmental allergies will schedule for allergy test 2. Asthma has been having some flare ups could be related to the mold findings. Follow up PFT will be ordered 3. ETOH he still continues to drink again counseled on ETOH withdrawal in the night time. He drinks at night bed time  General Counseling: I have discussed the findings of the evaluation and examination with Charles Mata.  I have also discussed  any further diagnostic evaluation thatmay be needed or ordered today. Charles Mata verbalizes understanding of the findings of todays visit. We also reviewed his medications today and discussed drug interactions and side effects including but not limited excessive drowsiness and altered mental states. We also discussed that there is always a risk not just to him but also people around him. he has been encouraged to call the office with any questions or concerns that should arise related to todays visit.    Time spent: 51min  I have personally obtained a history, examined the patient, evaluated laboratory and imaging results, formulated the assessment and plan and placed orders.    [name], MD [facility] Pulmonary and Critical Care Sleep medicine

## 2019-01-16 NOTE — Patient Instructions (Signed)
Allergy Blood Testing Why am I having this test? Allergy blood testing is used to help diagnose specific allergies. An allergy occurs when your body's defense system (immune system) is more sensitive to certain substances. The immune system overreacts to the substance, causing allergy symptoms. You may have this test to help find out what is causing symptoms such as:  Rashes.  Runny nose.  Sneezing.  Asthma flare-ups. Blood testing is often used when skin testing for allergies is not an option. What is being tested? This test measures the level of immunoglobulin E (IgE) in your blood. IgE is an antibody that the body produces in response to a substance that you are allergic to (allergen). Testing can determine exactly what allergen affects you. A common method used to measure IgE is the radioallergosorbent test (RAST) in which specific allergens are tested. You can be tested for various allergens, including:  Animal dander.  Foods.  Pollens.  Dusts.  Latex.  Molds.  Insect venoms.  Medicines. What kind of sample is taken?  A blood sample is required for this test. It is usually collected by inserting a needle into a blood vessel. Tell a health care provider about:  All medicines you are taking, including vitamins, herbs, eye drops, creams, and over-the-counter medicines.  Any medical conditions you have. How are the results reported? Your test results will be reported as values or RAST ratings. Your health care provider will compare your results to normal ranges that were established after testing a large group of people (reference ranges). Reference ranges may vary among labs and hospitals. For this test, common reference ranges are: Reference ranges for IgE  Adult: 0-100 IU/mL.  Child 0-23 months: 0-13 IU/mL.  Child 2-5 years: 0-56 IU/mL.  Child 6-10 years: 0-85 IU/mL. Reference ranges for RAST  RAST rating 0: IgE level less than 0.35 kU/L.  RAST rating 1: IgE  level 0.35-0.69 kU/L.  RAST rating 2: IgE level 0.70-3.49 kU/L.  RAST rating 3: IgE level 3.50-17.49 kU/L.  RAST rating 4: IgE level 17.50-49.99 kU/L.  RAST rating 5: IgE level 50-100 kU/L.  RAST rating 6: IgE level greater than 100 kU/L. What do the results mean? IgE levels increase when an allergic person is exposed to the allergen. The higher the IgE level in a RAST test, the higher the likelihood of an allergy to that allergen. A RAST rating of:  0 means that you have an absent or undetectable allergen-specific IgE.  1 means that you have a low level of allergen-specific IgE.  2 means that you have a moderate level of allergen-specific IgE.  3 means that you have a high level of allergen-specific IgE.  4 means that you have a very high level of allergen-specific IgE.  5 means that you have a very high level of allergen-specific IgE.  6 means that you have an extremely high level of allergen-specific IgE. Talk with your health care provider about what your results mean. Questions to ask your health care provider Ask your health care provider, or the department that is doing the test:  When will my results be ready?  How will I get my results?  What are my treatment options?  What other tests do I need?  What are my next steps? Summary  Allergy blood testing is used to help diagnose specific allergies.  This test measures the level of IgE in your blood. IgE is an antibody that the body produces in response to an allergen.  A common   method used to measure IgE is the radioallergosorbent test (RAST) in which specific allergens are tested.  Allergens that may be tested include animal dander, foods, pollens, and latex. This information is not intended to replace advice given to you by your health care provider. Make sure you discuss any questions you have with your health care provider. Document Released: 08/22/2004 Document Revised: 05/22/2017 Document Reviewed:  05/22/2017 Elsevier Interactive Patient Education  2019 [employer].

## 2019-01-19 DIAGNOSIS — R05 Cough: Secondary | ICD-10-CM | POA: Diagnosis not present

## 2019-01-19 DIAGNOSIS — J40 Bronchitis, not specified as acute or chronic: Secondary | ICD-10-CM | POA: Diagnosis not present

## 2019-02-06 ENCOUNTER — Ambulatory Visit: Payer: PPO | Admitting: Internal Medicine

## 2019-02-10 ENCOUNTER — Encounter: Payer: Self-pay | Admitting: Internal Medicine

## 2019-02-10 ENCOUNTER — Ambulatory Visit: Payer: PPO | Admitting: Internal Medicine

## 2019-02-10 ENCOUNTER — Other Ambulatory Visit: Payer: Self-pay

## 2019-02-10 VITALS — BP 116/76 | HR 71 | Resp 16 | Ht 68.0 in | Wt 227.0 lb

## 2019-02-10 DIAGNOSIS — J301 Allergic rhinitis due to pollen: Secondary | ICD-10-CM

## 2019-02-10 DIAGNOSIS — J302 Other seasonal allergic rhinitis: Secondary | ICD-10-CM | POA: Diagnosis not present

## 2019-02-10 NOTE — Patient Instructions (Signed)
Allergy Blood Testing Why am I having this test? Allergy blood testing is used to help diagnose specific allergies. An allergy occurs when your body's defense system (immune system) is more sensitive to certain substances. The immune system overreacts to the substance, causing allergy symptoms. You may have this test to help find out what is causing symptoms such as:  Rashes.  Runny nose.  Sneezing.  Asthma flare-ups. Blood testing is often used when skin testing for allergies is not an option. What is being tested? This test measures the level of immunoglobulin E (IgE) in your blood. IgE is an antibody that the body produces in response to a substance that you are allergic to (allergen). Testing can determine exactly what allergen affects you. A common method used to measure IgE is the radioallergosorbent test (RAST) in which specific allergens are tested. You can be tested for various allergens, including:  Animal dander.  Foods.  Pollens.  Dusts.  Latex.  Molds.  Insect venoms.  Medicines. What kind of sample is taken?  A blood sample is required for this test. It is usually collected by inserting a needle into a blood vessel. Tell a health care provider about:  All medicines you are taking, including vitamins, herbs, eye drops, creams, and over-the-counter medicines.  Any medical conditions you have. How are the results reported? Your test results will be reported as values or RAST ratings. Your health care provider will compare your results to normal ranges that were established after testing a large group of people (reference ranges). Reference ranges may vary among labs and hospitals. For this test, common reference ranges are: Reference ranges for IgE  Adult: 0-100 IU/mL.  Child 0-23 months: 0-13 IU/mL.  Child 2-5 years: 0-56 IU/mL.  Child 6-10 years: 0-85 IU/mL. Reference ranges for RAST  RAST rating 0: IgE level less than 0.35 kU/L.  RAST rating 1: IgE  level 0.35-0.69 kU/L.  RAST rating 2: IgE level 0.70-3.49 kU/L.  RAST rating 3: IgE level 3.50-17.49 kU/L.  RAST rating 4: IgE level 17.50-49.99 kU/L.  RAST rating 5: IgE level 50-100 kU/L.  RAST rating 6: IgE level greater than 100 kU/L. What do the results mean? IgE levels increase when an allergic person is exposed to the allergen. The higher the IgE level in a RAST test, the higher the likelihood of an allergy to that allergen. A RAST rating of:  0 means that you have an absent or undetectable allergen-specific IgE.  1 means that you have a low level of allergen-specific IgE.  2 means that you have a moderate level of allergen-specific IgE.  3 means that you have a high level of allergen-specific IgE.  4 means that you have a very high level of allergen-specific IgE.  5 means that you have a very high level of allergen-specific IgE.  6 means that you have an extremely high level of allergen-specific IgE. Talk with your health care provider about what your results mean. Questions to ask your health care provider Ask your health care provider, or the department that is doing the test:  When will my results be ready?  How will I get my results?  What are my treatment options?  What other tests do I need?  What are my next steps? Summary  Allergy blood testing is used to help diagnose specific allergies.  This test measures the level of IgE in your blood. IgE is an antibody that the body produces in response to an allergen.  A common   method used to measure IgE is the radioallergosorbent test (RAST) in which specific allergens are tested.  Allergens that may be tested include animal dander, foods, pollens, and latex. This information is not intended to replace advice given to you by your health care provider. Make sure you discuss any questions you have with your health care provider. Document Released: 08/22/2004 Document Revised: 11/19/2018 Document Reviewed:  05/22/2017 Elsevier Patient Education  2020 [employer].

## 2019-02-10 NOTE — Procedures (Signed)
    [facility] Allergy MQT Recording Form  [place] [address] [place], [place] [address] Phone [phone] Fax [phone]   Patient Name: Charles Mata Age: 68 y.o. Sex: male Date of Service: 02/10/2019   Performing Provider: [name] MD [facility]         Battery A Back   Site Antigen [facility] Flare  A1 Positive Control 10 10  A2 Negative Control 4 5  A3 American Elm 0 0  A4 Maple Box Elder 0 0  A5 Grass Mix 0 0  A6 Dock Sorrel Mix 0 0  A7 Russian Thistle 0 0  A8 Ragweed Mix 0 0  A9 English Pantain 0 0  A10 Oak Mix  0 0   Battery B Wheal Flare  B1 Lambs Quarters 0 0  B2 Cottonwood 0 0  B3 Pigweed Mix 0 0  B4 Acacia 0 0  B5 Pine Mix 0 0  B6 Privet 0 0  B7 White/Red Mulberry 0 0  B8 Western Water Hemp 0 0  B9 Guatemala Grass 0 0  B10 Melalucea 0 0   Battery C Wheal Flare  C1 Red River Birch 0 0  C2 Eastern Sycamore 0 0  C3 Bahai Grass 5 6  C4 American Beech 0 0  C5 Ash Mix 5 5  C6 Black Willow 0 0  C7 Hickory 0 0  C8 Black Walnut 0 0  C9 Red Cedar 12 10  C10 Sweet Gum  0 0   Battery D Wheal Flare  D1 Cultivated Oat 0 0  D2 Dog Fennel 0 0  D3 Common Mugwort 0 0  D4 Marsh Elder 0 0  D5 Johnson 0 0  D6 Hackberry Tree 0 00  D7 Bayberry Tree 0 0  D8 Cypress, Bald Tree 0 0  D9 Aspergillus Fumigatus 0 0  D10 Alternia  0 0   Battery E Wheal Flare  E1 Dreschlere 0 0  E2 Fusarium Mix 0 0  E3 Cladosporum Sph 0 0  E4 Bipolaris 0 0  E5 Penicillin chrys 0 0  E6 Cladosporum Herb 0 0  E7 Candida 00 0  E8 Aureobasidium 0 0  E9 Rhizopus 0 0  E10 Botrytis  0 0   Battery F Wheal Flare  F1 Aspergillus [place] 5 6  F2 Dust Mite Mix 0 0  F3 Cockroach Mix 0 0  F4 Cat Hair 0 0  F5 Dog Mixed breeds 0 0  F6 Feather Mix 0 0

## 2019-02-20 DIAGNOSIS — K5900 Constipation, unspecified: Secondary | ICD-10-CM | POA: Diagnosis not present

## 2019-02-20 DIAGNOSIS — K219 Gastro-esophageal reflux disease without esophagitis: Secondary | ICD-10-CM | POA: Diagnosis not present

## 2019-02-20 DIAGNOSIS — Z86010 Personal history of colonic polyps: Secondary | ICD-10-CM | POA: Diagnosis not present

## 2019-02-20 DIAGNOSIS — R140 Abdominal distension (gaseous): Secondary | ICD-10-CM | POA: Diagnosis not present

## 2019-02-20 DIAGNOSIS — K449 Diaphragmatic hernia without obstruction or gangrene: Secondary | ICD-10-CM | POA: Diagnosis not present

## 2019-02-23 ENCOUNTER — Other Ambulatory Visit: Payer: Self-pay | Admitting: Internal Medicine

## 2019-03-05 ENCOUNTER — Ambulatory Visit (INDEPENDENT_AMBULATORY_CARE_PROVIDER_SITE_OTHER): Payer: PPO | Admitting: Internal Medicine

## 2019-03-05 ENCOUNTER — Other Ambulatory Visit: Payer: Self-pay

## 2019-03-05 DIAGNOSIS — J4520 Mild intermittent asthma, uncomplicated: Secondary | ICD-10-CM | POA: Diagnosis not present

## 2019-03-05 LAB — PULMONARY FUNCTION TEST

## 2019-03-06 ENCOUNTER — Ambulatory Visit: Payer: PPO | Admitting: Internal Medicine

## 2019-03-11 ENCOUNTER — Other Ambulatory Visit: Payer: Self-pay

## 2019-03-11 DIAGNOSIS — J301 Allergic rhinitis due to pollen: Secondary | ICD-10-CM | POA: Diagnosis not present

## 2019-03-11 MED ORDER — EPINEPHRINE 0.3 MG/0.3ML IJ SOAJ: 0.3000 mg | INTRAMUSCULAR | 3 refills | Status: DC | PRN

## 2019-03-12 ENCOUNTER — Other Ambulatory Visit: Payer: Self-pay

## 2019-03-12 ENCOUNTER — Ambulatory Visit: Payer: PPO

## 2019-03-12 DIAGNOSIS — J301 Allergic rhinitis due to pollen: Secondary | ICD-10-CM | POA: Diagnosis not present

## 2019-03-12 NOTE — Progress Notes (Signed)
I spoke to Charles Mata regarding Immunomodulation and use of allergy shots based on the result of his allergy test. He understands the risks and benefits of allergy shots and willing to proceed with procedure. We will order his vials. Also Charles Mata understands that he will need to carry Epipen and benadryl/antihistamines in case of a serious reaction to the shots. Informed consent on file explaining risks and benefits of allergy shot treatment plan. Patient was premedicated with montelukst. After confirming with pt had his Epipen, he  was given shot . Patient given 2 injections 0.05 ml intradermal was given. Vial number Patient was observed in the office for reaction for 30 minutes. He  was instructed to call the office for any reaction after discharge home. If a severe reaction were to occur he was instructed to call 911 and proceed to the ER

## 2019-03-16 NOTE — Procedures (Signed)
[facility] [address] [place], [address]  DATE OF SERVICE: 03/05/2019  Complete Pulmonary Function Testing Interpretation:  FINDINGS:  Forced vital capacity is normal FEV1 is normal FEV1 FVC ratio is normal.  Postbronchodilator no significant change in the FEV1.  Total lung capacity is mildly decreased.  Residual volume is decreased residual anti-lung capacity ratio is decreased thoracic gas volume is decreased.  DLCO is normal.  IMPRESSION:  This pulmonary function study is suggestive of mild restrictive lung disease.  The DLCO is within normal limits.  Clinical correlation is recommended  [name], MD [facility] Pulmonary Critical Care Medicine Sleep Medicine

## 2019-03-19 ENCOUNTER — Other Ambulatory Visit: Payer: Self-pay

## 2019-03-19 ENCOUNTER — Ambulatory Visit: Payer: PPO

## 2019-03-19 DIAGNOSIS — J301 Allergic rhinitis due to pollen: Secondary | ICD-10-CM | POA: Diagnosis not present

## 2019-03-26 ENCOUNTER — Ambulatory Visit: Payer: PPO

## 2019-03-26 ENCOUNTER — Other Ambulatory Visit: Payer: Self-pay

## 2019-03-26 DIAGNOSIS — J301 Allergic rhinitis due to pollen: Secondary | ICD-10-CM | POA: Diagnosis not present

## 2019-04-02 ENCOUNTER — Other Ambulatory Visit: Payer: Self-pay

## 2019-04-02 ENCOUNTER — Ambulatory Visit: Payer: PPO

## 2019-04-02 DIAGNOSIS — J301 Allergic rhinitis due to pollen: Secondary | ICD-10-CM | POA: Diagnosis not present

## 2019-04-07 ENCOUNTER — Encounter: Payer: Self-pay | Admitting: Internal Medicine

## 2019-04-07 ENCOUNTER — Ambulatory Visit: Payer: PPO | Admitting: Internal Medicine

## 2019-04-07 ENCOUNTER — Other Ambulatory Visit: Payer: Self-pay

## 2019-04-07 VITALS — BP 130/78 | HR 90 | Resp 16 | Ht 68.0 in | Wt 235.0 lb

## 2019-04-07 DIAGNOSIS — Z9989 Dependence on other enabling machines and devices: Secondary | ICD-10-CM | POA: Diagnosis not present

## 2019-04-07 DIAGNOSIS — J302 Other seasonal allergic rhinitis: Secondary | ICD-10-CM

## 2019-04-07 DIAGNOSIS — J301 Allergic rhinitis due to pollen: Secondary | ICD-10-CM | POA: Diagnosis not present

## 2019-04-07 DIAGNOSIS — J4520 Mild intermittent asthma, uncomplicated: Secondary | ICD-10-CM | POA: Diagnosis not present

## 2019-04-07 DIAGNOSIS — G4733 Obstructive sleep apnea (adult) (pediatric): Secondary | ICD-10-CM | POA: Diagnosis not present

## 2019-04-07 NOTE — Progress Notes (Signed)
[facility] [place], [place] [address]  Pulmonary Sleep Medicine   Office Visit Note  Patient Name: Charles Mata DOB: [birth year] MRN [id number]  Date of Service: 04/07/2019  Complaints/HPI: Pt is here to follow up on OSA.  He reports he wears his current cpap most nights.  He is having issues with his mask seal. He reports his daytime fatigue and sleepiness has increased drastically.  He reports fatigue, and somnolence.  Pt reports his current machine is likely older than 5 years.  He is concerned that his pressure may not be accurate anymore.  He reports he has gained some weight in the last 5 years.   ROS  General: (-) fever, (-) chills, (-) night sweats, (-) weakness Skin: (-) rashes, (-) itching,. Eyes: (-) visual changes, (-) redness, (-) itching. Nose and Sinuses: (-) nasal stuffiness or itchiness, (-) postnasal drip, (-) nosebleeds, (-) sinus trouble. Mouth and Throat: (-) sore throat, (-) hoarseness. Neck: (-) swollen glands, (-) enlarged thyroid, (-) neck pain. Respiratory: - cough, (-) bloody sputum, - shortness of breath, - wheezing. Cardiovascular: - ankle swelling, (-) chest pain. Lymphatic: (-) lymph node enlargement. Neurologic: (-) numbness, (-) tingling. Psychiatric: (-) anxiety, (-) depression   Current Medication: Outpatient Encounter Medications as of 04/07/2019  Medication Sig  . aspirin EC 81 MG tablet Take by mouth.  . CVS VITAMIN B12 1000 MCG tablet TAKE 1 TABLET (1,000 MCG TOTAL) BY MOUTH ONCE DAILY.  Marland Kitchen EPINEPHrine (EPIPEN 2-PAK) 0.3 mg/0.3 mL IJ SOAJ injection Inject 0.3 mLs (0.3 mg total) into the muscle as needed for anaphylaxis.  . furosemide (LASIX) 40 MG tablet TAKE 1 TABLET (40 MG TOTAL) BY MOUTH ONCE DAILY.  Marland Kitchen glucose blood (CONTOUR NEXT TEST) test strip   . glyBURIDE (DIABETA) 5 MG tablet TAKE 1 TABLET BY MOUTH EVERY DAY WITH BREAKFAST  . Ipratropium-Albuterol (COMBIVENT RESPIMAT) 20-100 MCG/ACT AERS respimat INHALE  1 PUFF BY MOUTH 4 TIMES A DAY AS NEEDED  . ipratropium-albuterol (DUONEB) 0.5-2.5 (3) MG/3ML SOLN Take 3 mLs by nebulization every 4 (four) hours as needed.  Marland Kitchen lisinopril (PRINIVIL,ZESTRIL) 5 MG tablet Take by mouth.  . Misc. Devices MISC by Does not apply route. cpap  . montelukast (SINGULAIR) 10 MG tablet TAKE 1 TABLET BY MOUTH EVERY DAY  . omeprazole (PRILOSEC) 40 MG capsule TAKE 1 CAPSULE BY MOUTH EVERY DAY  . oxymetazoline (NASAL RELIEF) 0.05 % nasal spray Place into the nose.  Marland Kitchen PROCTOSOL HC 2.5 % rectal cream APPLY TO AFFECTED AREA 3 TIMES A DAY  . rosuvastatin (CRESTOR) 10 MG tablet Take 5 mg by mouth daily.  . tamsulosin (FLOMAX) 0.4 MG CAPS capsule TAKE ONE CAPSULE BY MOUTH EVERY DAY 1/2 HOUR AFTER A MEAL  . Vitamin D, Ergocalciferol, (DRISDOL) 50000 units CAPS capsule TAKE ONE CAPSULE BY MOUTH ONE TIME PER WEEK   No facility-administered encounter medications on file as of 04/07/2019.     Surgical History: Past Surgical History:  Procedure Laterality Date  . COLONOSCOPY    . heart catheriation      Medical History: Past Medical History:  Diagnosis Date  . Asthma   . Diabetes ([place])   . Hyperlipidemia   . Hypertension   . Sickle cell trait ([place])   . Sleep apnea     Family History: No family history on file.  Social History: Social History   Socioeconomic History  . Marital status: Widowed    Spouse name: Not on file  . Number of  children: Not on file  . Years of education: Not on file  . Highest education level: Not on file  Occupational History  . Not on file  Social Needs  . Financial resource strain: Not on file  . Food insecurity    Worry: Not on file    Inability: Not on file  . Transportation needs    Medical: Not on file    Non-medical: Not on file  Tobacco Use  . Smoking status: Former Research scientist (life sciences)  . Smokeless tobacco: Never Used  Substance and Sexual Activity  . Alcohol use: Not Currently    Alcohol/week: 3.0 standard drinks    Types: 3 Glasses  of wine per week    Frequency: Never    Comment: a night  . Drug use: No  . Sexual activity: Not on file  Lifestyle  . Physical activity    Days per week: Not on file    Minutes per session: Not on file  . Stress: Not on file  Relationships  . Social Herbalist on phone: Not on file    Gets together: Not on file    Attends religious service: Not on file    Active member of club or organization: Not on file    Attends meetings of clubs or organizations: Not on file    Relationship status: Not on file  . Intimate partner violence    Fear of current or ex partner: Not on file    Emotionally abused: Not on file    Physically abused: Not on file    Forced sexual activity: Not on file  Other Topics Concern  . Not on file  Social History Narrative  . Not on file    Vital Signs: Blood pressure 130/78, pulse 90, resp. rate 16, height 5\' 8"  (1.727 m), weight 235 lb (106.6 kg), SpO2 97 %.  Examination: General Appearance: The patient is well-developed, well-nourished, and in no distress. Skin: Gross inspection of skin unremarkable. Head: normocephalic, no gross deformities. Eyes: no gross deformities noted. ENT: ears appear grossly normal no exudates. Neck: Supple. No thyromegaly. No LAD. Respiratory: clear bilaterlly. Cardiovascular: Normal S1 and S2 without murmur or rub. Extremities: No cyanosis. pulses are equal. Neurologic: Alert and oriented. No involuntary movements.  LABS: No results found for this or any previous visit (from the past 2160 hour(s)).  Radiology: Dg Chest 2 View  Result Date: 02/24/2018 CLINICAL DATA:  68 year old male with respiratory distress. EXAM: CHEST - 2 VIEW COMPARISON:  Chest radiograph dated 08/31/2009 FINDINGS: The heart size and mediastinal contours are within normal limits. Both lungs are clear. The visualized skeletal structures are unremarkable. IMPRESSION: No active cardiopulmonary disease. Electronically Signed   By: Anner Crete M.D.   On: 02/24/2018 00:24    No results found.  No results found.    Assessment and Plan: Patient Active Problem List   Diagnosis Date Noted  . Benign paroxysmal positional vertigo 11/30/2016  . Chronic midline low back pain without sciatica 10/07/2015  . Type 2 diabetes mellitus, controlled ([place]) 11/26/2014  . COPD, mild ([place]) 01/29/2014  . Sleep apnea 01/29/2014  . Hyperlipemia 01/16/2014  . Hypertension 01/16/2014    1. OSA on CPAP Will get home sleep study to reevaluate OSA and  - Home sleep test  2. Seasonal allergic rhinitis due to pollen Continue to take otc allergy m  3. Mild intermittent chronic asthma without complication Stable, continue present management.   4. Seasonal allergies Stable, continue present  management.   General Counseling: I have discussed the findings of the evaluation and examination with Annie Main.  I have also discussed any further diagnostic evaluation thatmay be needed or ordered today. Jerrie verbalizes understanding of the findings of todays visit. We also reviewed his medications today and discussed drug interactions and side effects including but not limited excessive drowsiness and altered mental states. We also discussed that there is always a risk not just to him but also people around him. he has been encouraged to call the office with any questions or concerns that should arise related to todays visit.    Time spent: 25 This patient was seen by Orson Gear AGNP-C in Collaboration with Dr. Devona Konig as a part of collaborative care agreement.   I have personally obtained a history, examined the patient, evaluated laboratory and imaging results, formulated the assessment and plan and placed orders.    [name], MD [facility] Pulmonary and Critical Care Sleep medicine

## 2019-04-09 ENCOUNTER — Other Ambulatory Visit: Payer: Self-pay

## 2019-04-09 ENCOUNTER — Other Ambulatory Visit: Payer: PPO | Admitting: Internal Medicine

## 2019-04-09 DIAGNOSIS — G4710 Hypersomnia, unspecified: Secondary | ICD-10-CM | POA: Diagnosis not present

## 2019-04-10 NOTE — Procedures (Signed)
[facility] [place], [place] [address]  Sleep Specialist: [name], MD [place] Sleep Study Interpretation  Patient Name: Charles Mata Patient MR [id number] DOB:[birth year]  Date of Study: April 09, 2019  Indications for study: Hypersomnia snoring  BMI: 35.7 kg/m       Respiratory Data:  Total AHI: 4.5/h the RDI was 7.7/h  Total Obstructive Apneas: 0  Total Central Apneas: 0  Total Mixed Apneas: 0  Total Hypopneas: 27  If the AHI is greater than 5 per hour patient qualifies for PAP evaluation  Oximetry Data:  Oxygen Desaturation Index: 6.1  Lowest Desaturation: 65%  Cardiac Data:  Minimum Heart Rate: 56  Maximum Heart Rate: 150   Impression / Diagnosis:  This apnea study does not reveals borderline sleep disordered breathing.  An AHI of 4.5/h and a respiratory disturbance index of 7.7/h.  The patient does however have severe oxygen desaturations down to 65% and so therefore should be evaluated for nocturnal oxygen.  In addition patient did have significant tachycardia and bradycardia clinical correlation is recommended.  Consider a cardiology work-up  GENERAL Recommendations:  1.  Consider Auto PAP with pressure ranges 5-20 cmH20 with download, or facility based PAP Titration Study  2.  Consider PAP interface mask fitted for patient comfort, Heated Humidification & PAP compliance monitoring (1 month, 3 months & 12 months after PAP initiation)  3. Consider treatment with mandibular advancement splint (MAS) or referral to an ENT surgeon for modification to the upper airway if the patient prefers an alternate therapy or the PAP trial is unsuccessful  4. Sleep hygiene measures should be discussed with the patient  5. Behavioral therapy such as weight reduction or smoking cessation as appropriate for the patient  6. Advise patient against the use of alcohol or sedatives in so much as these substances can worsen  excessive daytime sleepiness and respiratory disturbances of sleep  7. Advise patient against participating in potentially dangerous activities while drowsy such as operating a motor vehicle, heavy equipment or power tools as it can put them and others in danger  8. Advise patient of the long term consequences of OSA if left untreated, need for treatment and close follow up  9. Clinical follow up as deemed necessary     This Level III home sleep study was performed using the [employer], a 4 channel screening device subject to limitations. Depending on actual total sleep time, not measured in this study, the AHI (sum of apneas and hypopneas/hr of sleep) and therefore the severity of sleep apnea may be underestimated. As with any single night study, including Level 1 attended PSG, severity of sleep apnea may also be underestimated due to the lack of supine and/or REM sleep.  The interpretation associated with this report is based on normal values and degrees of severity in accordance with AASM parameters and/or estimated from multiple sources in the literature for adults ages 32-80+. These may not agree with the displayed values. The patient's treating physician should use the interpretation and recommendations in conjunction with the overall clinical evaluation and treatment of the patient.  Some of the terminology used in this scored ApneaLink report was developed several years ago and may not always be in accordance with current nomenclature. This in no way affects the accuracy of the data or the reliability of the interpretation and recommendations.

## 2019-04-17 DIAGNOSIS — G8929 Other chronic pain: Secondary | ICD-10-CM | POA: Diagnosis not present

## 2019-04-17 DIAGNOSIS — K5900 Constipation, unspecified: Secondary | ICD-10-CM | POA: Diagnosis not present

## 2019-04-17 DIAGNOSIS — E119 Type 2 diabetes mellitus without complications: Secondary | ICD-10-CM | POA: Diagnosis not present

## 2019-04-17 DIAGNOSIS — G4733 Obstructive sleep apnea (adult) (pediatric): Secondary | ICD-10-CM | POA: Diagnosis not present

## 2019-04-17 DIAGNOSIS — H8110 Benign paroxysmal vertigo, unspecified ear: Secondary | ICD-10-CM | POA: Diagnosis not present

## 2019-04-17 DIAGNOSIS — G4700 Insomnia, unspecified: Secondary | ICD-10-CM | POA: Diagnosis not present

## 2019-04-17 DIAGNOSIS — E7800 Pure hypercholesterolemia, unspecified: Secondary | ICD-10-CM | POA: Diagnosis not present

## 2019-04-17 DIAGNOSIS — I10 Essential (primary) hypertension: Secondary | ICD-10-CM | POA: Diagnosis not present

## 2019-04-17 DIAGNOSIS — J449 Chronic obstructive pulmonary disease, unspecified: Secondary | ICD-10-CM | POA: Diagnosis not present

## 2019-04-17 DIAGNOSIS — M25511 Pain in right shoulder: Secondary | ICD-10-CM | POA: Diagnosis not present

## 2019-04-17 DIAGNOSIS — Z125 Encounter for screening for malignant neoplasm of prostate: Secondary | ICD-10-CM | POA: Diagnosis not present

## 2019-04-22 ENCOUNTER — Encounter: Payer: Self-pay | Admitting: Internal Medicine

## 2019-04-22 ENCOUNTER — Other Ambulatory Visit: Payer: Self-pay

## 2019-04-22 ENCOUNTER — Ambulatory Visit: Payer: PPO | Admitting: Internal Medicine

## 2019-04-22 VITALS — BP 127/76 | HR 93 | Temp 98.1°F | Resp 16 | Ht 68.0 in | Wt 237.0 lb

## 2019-04-22 DIAGNOSIS — I10 Essential (primary) hypertension: Secondary | ICD-10-CM

## 2019-04-22 DIAGNOSIS — J301 Allergic rhinitis due to pollen: Secondary | ICD-10-CM | POA: Diagnosis not present

## 2019-04-22 DIAGNOSIS — J449 Chronic obstructive pulmonary disease, unspecified: Secondary | ICD-10-CM

## 2019-04-22 DIAGNOSIS — Z9989 Dependence on other enabling machines and devices: Secondary | ICD-10-CM

## 2019-04-22 DIAGNOSIS — G4733 Obstructive sleep apnea (adult) (pediatric): Secondary | ICD-10-CM | POA: Diagnosis not present

## 2019-04-22 NOTE — Progress Notes (Signed)
[facility] [place], [place] [address]  Pulmonary Sleep Medicine   Office Visit Note  Patient Name: Charles Mata DOB: 01/19/1951 MRN [id number]  Date of Service: 04/22/2019  Complaints/HPI: Pt is here to follow up on Sleep study.  His study shows an AHI of 4.5/hr. With his history of copd, there is concern for overlap syndrome.  He had a significant oxygen desaturation down to 65%, and a in lab study should be done to evaluate.  He continues to report increased daytime fatigue and somnolence.  Also very loud snoring.     ROS  General: (-) fever, (-) chills, (-) night sweats, (-) weakness Skin: (-) rashes, (-) itching,. Eyes: (-) visual changes, (-) redness, (-) itching. Nose and Sinuses: (-) nasal stuffiness or itchiness, (-) postnasal drip, (-) nosebleeds, (-) sinus trouble. Mouth and Throat: (-) sore throat, (-) hoarseness. Neck: (-) swollen glands, (-) enlarged thyroid, (-) neck pain. Respiratory: - cough, (-) bloody sputum, - shortness of breath, - wheezing. Cardiovascular: - ankle swelling, (-) chest pain. Lymphatic: (-) lymph node enlargement. Neurologic: (-) numbness, (-) tingling. Psychiatric: (-) anxiety, (-) depression   Current Medication: Outpatient Encounter Medications as of 04/22/2019  Medication Sig  . aspirin EC 81 MG tablet Take by mouth.  . CVS VITAMIN B12 1000 MCG tablet TAKE 1 TABLET (1,000 MCG TOTAL) BY MOUTH ONCE DAILY.  Marland Kitchen EPINEPHrine (EPIPEN 2-PAK) 0.3 mg/0.3 mL IJ SOAJ injection Inject 0.3 mLs (0.3 mg total) into the muscle as needed for anaphylaxis.  . furosemide (LASIX) 40 MG tablet TAKE 1 TABLET (40 MG TOTAL) BY MOUTH ONCE DAILY.  Marland Kitchen glucose blood (CONTOUR NEXT TEST) test strip   . glyBURIDE (DIABETA) 5 MG tablet TAKE 1 TABLET BY MOUTH EVERY DAY WITH BREAKFAST  . Ipratropium-Albuterol (COMBIVENT RESPIMAT) 20-100 MCG/ACT AERS respimat INHALE 1 PUFF BY MOUTH 4 TIMES A DAY AS NEEDED  . ipratropium-albuterol (DUONEB) 0.5-2.5  (3) MG/3ML SOLN Take 3 mLs by nebulization every 4 (four) hours as needed.  Marland Kitchen lisinopril (PRINIVIL,ZESTRIL) 5 MG tablet Take by mouth.  . Misc. Devices MISC by Does not apply route. cpap  . montelukast (SINGULAIR) 10 MG tablet TAKE 1 TABLET BY MOUTH EVERY DAY  . omeprazole (PRILOSEC) 40 MG capsule TAKE 1 CAPSULE BY MOUTH EVERY DAY  . oxymetazoline (NASAL RELIEF) 0.05 % nasal spray Place into the nose.  Marland Kitchen PROCTOSOL HC 2.5 % rectal cream APPLY TO AFFECTED AREA 3 TIMES A DAY  . rosuvastatin (CRESTOR) 10 MG tablet Take 5 mg by mouth daily.  . tamsulosin (FLOMAX) 0.4 MG CAPS capsule TAKE ONE CAPSULE BY MOUTH EVERY DAY 1/2 HOUR AFTER A MEAL  . Vitamin D, Ergocalciferol, (DRISDOL) 50000 units CAPS capsule TAKE ONE CAPSULE BY MOUTH ONE TIME PER WEEK   No facility-administered encounter medications on file as of 04/22/2019.     Surgical History: Past Surgical History:  Procedure Laterality Date  . COLONOSCOPY    . heart catheriation      Medical History: Past Medical History:  Diagnosis Date  . Asthma   . Diabetes ([place])   . Hyperlipidemia   . Hypertension   . Sickle cell trait ([place])   . Sleep apnea     Family History: History reviewed. No pertinent family history.  Social History: Social History   Socioeconomic History  . Marital status: Widowed    Spouse name: Not on file  . Number of children: Not on file  . Years of education: Not on file  .  Highest education level: Not on file  Occupational History  . Not on file  Social Needs  . Financial resource strain: Not on file  . Food insecurity    Worry: Not on file    Inability: Not on file  . Transportation needs    Medical: Not on file    Non-medical: Not on file  Tobacco Use  . Smoking status: Former Research scientist (life sciences)  . Smokeless tobacco: Never Used  Substance and Sexual Activity  . Alcohol use: Not Currently    Alcohol/week: 3.0 standard drinks    Types: 3 Glasses of wine per week    Frequency: Never    Comment: a night  .  Drug use: No  . Sexual activity: Not on file  Lifestyle  . Physical activity    Days per week: Not on file    Minutes per session: Not on file  . Stress: Not on file  Relationships  . Social Herbalist on phone: Not on file    Gets together: Not on file    Attends religious service: Not on file    Active member of club or organization: Not on file    Attends meetings of clubs or organizations: Not on file    Relationship status: Not on file  . Intimate partner violence    Fear of current or ex partner: Not on file    Emotionally abused: Not on file    Physically abused: Not on file    Forced sexual activity: Not on file  Other Topics Concern  . Not on file  Social History Narrative  . Not on file    Vital Signs: Blood pressure 127/76, pulse 93, temperature 98.1 F (36.7 C), resp. rate 16, height 5\' 8"  (1.727 m), weight 237 lb (107.5 kg), SpO2 98 %.  Examination: General Appearance: The patient is well-developed, well-nourished, and in no distress. Skin: Gross inspection of skin unremarkable. Head: normocephalic, no gross deformities. Eyes: no gross deformities noted. ENT: ears appear grossly normal no exudates. Neck: Supple. No thyromegaly. No LAD. Respiratory: clear bilaterally. Cardiovascular: Normal S1 and S2 without murmur or rub. Extremities: No cyanosis. pulses are equal. Neurologic: Alert and oriented. No involuntary movements.  LABS: Recent Results (from the past 2160 hour(s))  Pulmonary function test     Status: None   Collection Time: 03/05/19 12:00 PM  Result Value Ref Range   FEV1     FVC     FEV1/FVC     TLC     DLCO      Radiology: Dg Chest 2 View  Result Date: 02/24/2018 CLINICAL DATA:  68 year old male with respiratory distress. EXAM: CHEST - 2 VIEW COMPARISON:  Chest radiograph dated 08/31/2009 FINDINGS: The heart size and mediastinal contours are within normal limits. Both lungs are clear. The visualized skeletal structures are  unremarkable. IMPRESSION: No active cardiopulmonary disease. Electronically Signed   By: Anner Crete M.D.   On: 02/24/2018 00:24    No results found.  No results found.    Assessment and Plan: Patient Active Problem List   Diagnosis Date Noted  . Benign paroxysmal positional vertigo 11/30/2016  . Chronic midline low back pain without sciatica 10/07/2015  . Type 2 diabetes mellitus, controlled ([place]) 11/26/2014  . COPD, mild ([place]) 01/29/2014  . Sleep apnea 01/29/2014  . Hyperlipemia 01/16/2014  . Hypertension 01/16/2014    1. OSA on CPAP Will order sleep study for patient at this time to evaluate for OSA. - PSG  SLEEP STUDY; Future  2. Obstructive chronic bronchitis without exacerbation ([place]) Stable continue present therapy this time.  3. Hypertension, unspecified type Currently well-controlled 127/76.  Continue supportive therapy.  4. Morbid obesity ([place]) Obesity Counseling: Risk Assessment: An assessment of behavioral risk factors was made today and includes lack of exercise sedentary lifestyle, lack of portion control and poor dietary habits.  Risk Modification Advice: She was counseled on portion control guidelines. Restricting daily caloric intake to. . The detrimental long term effects of obesity on her health and ongoing poor compliance was also discussed with the patient.    General Counseling: I have discussed the findings of the evaluation and examination with Annie Main.  I have also discussed any further diagnostic evaluation thatmay be needed or ordered today. Johnchristopher verbalizes understanding of the findings of todays visit. We also reviewed his medications today and discussed drug interactions and side effects including but not limited excessive drowsiness and altered mental states. We also discussed that there is always a risk not just to him but also people around him. he has been encouraged to call the office with any questions or concerns that should arise  related to todays visit.    Time spent: 25 This patient was seen by Orson Gear AGNP-C in Collaboration with Dr. Devona Konig as a part of collaborative care agreement.   I have personally obtained a history, examined the patient, evaluated laboratory and imaging results, formulated the assessment and plan and placed orders.    [name], MD [facility] Pulmonary and Critical Care Sleep medicine

## 2019-04-23 ENCOUNTER — Ambulatory Visit: Payer: Self-pay

## 2019-04-24 DIAGNOSIS — K5900 Constipation, unspecified: Secondary | ICD-10-CM | POA: Diagnosis not present

## 2019-04-24 DIAGNOSIS — K581 Irritable bowel syndrome with constipation: Secondary | ICD-10-CM | POA: Diagnosis not present

## 2019-04-24 DIAGNOSIS — M25511 Pain in right shoulder: Secondary | ICD-10-CM | POA: Diagnosis not present

## 2019-04-24 DIAGNOSIS — G4733 Obstructive sleep apnea (adult) (pediatric): Secondary | ICD-10-CM | POA: Diagnosis not present

## 2019-04-24 DIAGNOSIS — G8929 Other chronic pain: Secondary | ICD-10-CM | POA: Diagnosis not present

## 2019-04-24 DIAGNOSIS — E7800 Pure hypercholesterolemia, unspecified: Secondary | ICD-10-CM | POA: Diagnosis not present

## 2019-04-24 DIAGNOSIS — J449 Chronic obstructive pulmonary disease, unspecified: Secondary | ICD-10-CM | POA: Diagnosis not present

## 2019-04-24 DIAGNOSIS — Z0000 Encounter for general adult medical examination without abnormal findings: Secondary | ICD-10-CM | POA: Diagnosis not present

## 2019-04-24 DIAGNOSIS — E1165 Type 2 diabetes mellitus with hyperglycemia: Secondary | ICD-10-CM | POA: Diagnosis not present

## 2019-04-24 DIAGNOSIS — I10 Essential (primary) hypertension: Secondary | ICD-10-CM | POA: Diagnosis not present

## 2019-04-30 ENCOUNTER — Ambulatory Visit: Payer: PPO | Admitting: Internal Medicine

## 2019-04-30 ENCOUNTER — Ambulatory Visit: Payer: PPO

## 2019-04-30 ENCOUNTER — Encounter: Payer: Self-pay | Admitting: Internal Medicine

## 2019-04-30 ENCOUNTER — Other Ambulatory Visit: Payer: Self-pay

## 2019-04-30 VITALS — BP 118/64 | HR 85 | Temp 97.4°F | Resp 16 | Ht 68.0 in | Wt 237.0 lb

## 2019-04-30 DIAGNOSIS — J301 Allergic rhinitis due to pollen: Secondary | ICD-10-CM

## 2019-04-30 DIAGNOSIS — J449 Chronic obstructive pulmonary disease, unspecified: Secondary | ICD-10-CM | POA: Diagnosis not present

## 2019-04-30 DIAGNOSIS — K5901 Slow transit constipation: Secondary | ICD-10-CM | POA: Diagnosis not present

## 2019-04-30 NOTE — Progress Notes (Signed)
[facility] [place], [place] [address]  Pulmonary Sleep Medicine   Office Visit Note  Patient Name: Charles Mata DOB: 1951/07/24 MRN [id number]  Date of Service: 04/30/2019  Complaints/HPI: Pt here for acute visit. He reports two days ago he had difficulty breathing during the night.  He was unable to find his inhaler, but he did find his nebulizer.  He did two breathing treatments and was able to calm his breathing down.  He reports he was having issues with emptying his bowels completely.  When he gets "full" it causes him to have difficulty breathing. After a BM and breathing treatments he feels much better.    ROS  General: (-) fever, (-) chills, (-) night sweats, (-) weakness Skin: (-) rashes, (-) itching,. Eyes: (-) visual changes, (-) redness, (-) itching. Nose and Sinuses: (-) nasal stuffiness or itchiness, (-) postnasal drip, (-) nosebleeds, (-) sinus trouble. Mouth and Throat: (-) sore throat, (-) hoarseness. Neck: (-) swollen glands, (-) enlarged thyroid, (-) neck pain. Respiratory: - cough, (-) bloody sputum, - shortness of breath, - wheezing. Cardiovascular: - ankle swelling, (-) chest pain. Lymphatic: (-) lymph node enlargement. Neurologic: (-) numbness, (-) tingling. Psychiatric: (-) anxiety, (-) depression   Current Medication: Outpatient Encounter Medications as of 04/30/2019  Medication Sig  . aspirin EC 81 MG tablet Take by mouth.  . CVS VITAMIN B12 1000 MCG tablet TAKE 1 TABLET (1,000 MCG TOTAL) BY MOUTH ONCE DAILY.  Marland Kitchen EPINEPHrine (EPIPEN 2-PAK) 0.3 mg/0.3 mL IJ SOAJ injection Inject 0.3 mLs (0.3 mg total) into the muscle as needed for anaphylaxis.  . furosemide (LASIX) 40 MG tablet TAKE 1 TABLET (40 MG TOTAL) BY MOUTH ONCE DAILY.  Marland Kitchen glucose blood (CONTOUR NEXT TEST) test strip   . glyBURIDE (DIABETA) 5 MG tablet TAKE 1 TABLET BY MOUTH EVERY DAY WITH BREAKFAST  . Ipratropium-Albuterol (COMBIVENT RESPIMAT) 20-100 MCG/ACT AERS  respimat INHALE 1 PUFF BY MOUTH 4 TIMES A DAY AS NEEDED  . ipratropium-albuterol (DUONEB) 0.5-2.5 (3) MG/3ML SOLN Take 3 mLs by nebulization every 4 (four) hours as needed.  Marland Kitchen lisinopril (PRINIVIL,ZESTRIL) 5 MG tablet Take by mouth.  . Misc. Devices MISC by Does not apply route. cpap  . montelukast (SINGULAIR) 10 MG tablet TAKE 1 TABLET BY MOUTH EVERY DAY  . omeprazole (PRILOSEC) 40 MG capsule TAKE 1 CAPSULE BY MOUTH EVERY DAY  . oxymetazoline (NASAL RELIEF) 0.05 % nasal spray Place into the nose.  Marland Kitchen PROCTOSOL HC 2.5 % rectal cream APPLY TO AFFECTED AREA 3 TIMES A DAY  . rosuvastatin (CRESTOR) 10 MG tablet Take 5 mg by mouth daily.  . tamsulosin (FLOMAX) 0.4 MG CAPS capsule TAKE ONE CAPSULE BY MOUTH EVERY DAY 1/2 HOUR AFTER A MEAL  . Vitamin D, Ergocalciferol, (DRISDOL) 50000 units CAPS capsule TAKE ONE CAPSULE BY MOUTH ONE TIME PER WEEK   No facility-administered encounter medications on file as of 04/30/2019.     Surgical History: Past Surgical History:  Procedure Laterality Date  . COLONOSCOPY    . heart catheriation      Medical History: Past Medical History:  Diagnosis Date  . Asthma   . Diabetes ([place])   . Hyperlipidemia   . Hypertension   . Sickle cell trait ([place])   . Sleep apnea     Family History: History reviewed. No pertinent family history.  Social History: Social History   Socioeconomic History  . Marital status: Widowed    Spouse name: Not on file  . Number  of children: Not on file  . Years of education: Not on file  . Highest education level: Not on file  Occupational History  . Not on file  Social Needs  . Financial resource strain: Not on file  . Food insecurity    Worry: Not on file    Inability: Not on file  . Transportation needs    Medical: Not on file    Non-medical: Not on file  Tobacco Use  . Smoking status: Former Research scientist (life sciences)  . Smokeless tobacco: Never Used  Substance and Sexual Activity  . Alcohol use: Not Currently    Alcohol/week: 3.0  standard drinks    Types: 3 Glasses of wine per week    Frequency: Never    Comment: a night  . Drug use: No  . Sexual activity: Not on file  Lifestyle  . Physical activity    Days per week: Not on file    Minutes per session: Not on file  . Stress: Not on file  Relationships  . Social Herbalist on phone: Not on file    Gets together: Not on file    Attends religious service: Not on file    Active member of club or organization: Not on file    Attends meetings of clubs or organizations: Not on file    Relationship status: Not on file  . Intimate partner violence    Fear of current or ex partner: Not on file    Emotionally abused: Not on file    Physically abused: Not on file    Forced sexual activity: Not on file  Other Topics Concern  . Not on file  Social History Narrative  . Not on file    Vital Signs: Blood pressure 118/64, pulse 85, temperature (!) 97.4 F (36.3 C), resp. rate 16, height 5\' 8"  (1.727 m), weight 237 lb (107.5 kg), SpO2 99 %.  Examination: General Appearance: The patient is well-developed, well-nourished, and in no distress. Skin: Gross inspection of skin unremarkable. Head: normocephalic, no gross deformities. Eyes: no gross deformities noted. ENT: ears appear grossly normal no exudates. Neck: Supple. No thyromegaly. No LAD. Respiratory: clear bilaterally. Cardiovascular: Normal S1 and S2 without murmur or rub. Extremities: No cyanosis. pulses are equal. Neurologic: Alert and oriented. No involuntary movements.  LABS: Recent Results (from the past 2160 hour(s))  Pulmonary function test     Status: None   Collection Time: 03/05/19 12:00 PM  Result Value Ref Range   FEV1     FVC     FEV1/FVC     TLC     DLCO      Radiology: Dg Chest 2 View  Result Date: 02/24/2018 CLINICAL DATA:  68 year old male with respiratory distress. EXAM: CHEST - 2 VIEW COMPARISON:  Chest radiograph dated 08/31/2009 FINDINGS: The heart size and  mediastinal contours are within normal limits. Both lungs are clear. The visualized skeletal structures are unremarkable. IMPRESSION: No active cardiopulmonary disease. Electronically Signed   By: Anner Crete M.D.   On: 02/24/2018 00:24    No results found.  No results found.    Assessment and Plan: Patient Active Problem List   Diagnosis Date Noted  . Benign paroxysmal positional vertigo 11/30/2016  . Chronic midline low back pain without sciatica 10/07/2015  . Type 2 diabetes mellitus, controlled ([place]) 11/26/2014  . COPD, mild ([place]) 01/29/2014  . Sleep apnea 01/29/2014  . Hyperlipemia 01/16/2014  . Hypertension 01/16/2014    1. Obstructive chronic  bronchitis without exacerbation ([place]) Gave Symbicort sample to try, will send RX if patient has good relief.   2. Seasonal allergic rhinitis due to pollen Stable, continue present therapy.  Will have allergy shot today.   3. Morbid obesity ([place]) Obesity Counseling: Risk Assessment: An assessment of behavioral risk factors was made today and includes lack of exercise sedentary lifestyle, lack of portion control and poor dietary habits.  Risk Modification Advice: She was counseled on portion control guidelines. Restricting daily caloric intake to. . The detrimental long term effects of obesity on her health and ongoing poor compliance was also discussed with the patient.  4. Constipation by delayed colonic transit Continue to follow up with GI   General Counseling: I have discussed the findings of the evaluation and examination with Annie Main.  I have also discussed any further diagnostic evaluation thatmay be needed or ordered today. Jahzeel verbalizes understanding of the findings of todays visit. We also reviewed his medications today and discussed drug interactions and side effects including but not limited excessive drowsiness and altered mental states. We also discussed that there is always a risk not just to him but also  people around him. he has been encouraged to call the office with any questions or concerns that should arise related to todays visit.    Time spent: 15 This patient was seen by Orson Gear AGNP-C in Collaboration with Dr. Devona Konig as a part of collaborative care agreement.   I have personally obtained a history, examined the patient, evaluated laboratory and imaging results, formulated the assessment and plan and placed orders.    [name], MD [facility] Pulmonary and Critical Care Sleep medicine

## 2019-05-06 ENCOUNTER — Other Ambulatory Visit: Payer: Self-pay

## 2019-05-06 ENCOUNTER — Ambulatory Visit: Payer: PPO | Admitting: Internal Medicine

## 2019-05-06 ENCOUNTER — Ambulatory Visit: Payer: PPO

## 2019-05-06 DIAGNOSIS — J301 Allergic rhinitis due to pollen: Secondary | ICD-10-CM | POA: Diagnosis not present

## 2019-05-06 DIAGNOSIS — Z9989 Dependence on other enabling machines and devices: Secondary | ICD-10-CM

## 2019-05-06 DIAGNOSIS — E119 Type 2 diabetes mellitus without complications: Secondary | ICD-10-CM | POA: Diagnosis not present

## 2019-05-06 DIAGNOSIS — G4733 Obstructive sleep apnea (adult) (pediatric): Secondary | ICD-10-CM

## 2019-05-07 ENCOUNTER — Ambulatory Visit: Payer: PPO

## 2019-05-08 ENCOUNTER — Telehealth: Payer: Self-pay

## 2019-05-08 ENCOUNTER — Other Ambulatory Visit: Payer: Self-pay

## 2019-05-08 MED ORDER — FLUTICASONE PROPIONATE HFA 220 MCG/ACT IN AERO: 1.0000 | INHALATION_SPRAY | Freq: Two times a day (BID) | RESPIRATORY_TRACT | 1 refills | Status: DC

## 2019-05-08 NOTE — Telephone Encounter (Signed)
Spoke with pt stopped symbicort and we send flovent to phar as per adam

## 2019-05-14 ENCOUNTER — Ambulatory Visit (INDEPENDENT_AMBULATORY_CARE_PROVIDER_SITE_OTHER): Payer: PPO

## 2019-05-14 ENCOUNTER — Other Ambulatory Visit: Payer: Self-pay

## 2019-05-14 DIAGNOSIS — J301 Allergic rhinitis due to pollen: Secondary | ICD-10-CM | POA: Diagnosis not present

## 2019-05-19 ENCOUNTER — Other Ambulatory Visit: Payer: Self-pay

## 2019-05-19 ENCOUNTER — Ambulatory Visit: Payer: PPO | Admitting: Internal Medicine

## 2019-05-19 ENCOUNTER — Encounter: Payer: Self-pay | Admitting: Internal Medicine

## 2019-05-19 VITALS — BP 120/82 | HR 70 | Temp 97.2°F | Resp 16 | Ht 68.0 in | Wt 238.0 lb

## 2019-05-19 DIAGNOSIS — G4733 Obstructive sleep apnea (adult) (pediatric): Secondary | ICD-10-CM | POA: Diagnosis not present

## 2019-05-19 DIAGNOSIS — Z9989 Dependence on other enabling machines and devices: Secondary | ICD-10-CM

## 2019-05-19 DIAGNOSIS — I10 Essential (primary) hypertension: Secondary | ICD-10-CM

## 2019-05-19 DIAGNOSIS — J301 Allergic rhinitis due to pollen: Secondary | ICD-10-CM | POA: Diagnosis not present

## 2019-05-19 DIAGNOSIS — J449 Chronic obstructive pulmonary disease, unspecified: Secondary | ICD-10-CM | POA: Diagnosis not present

## 2019-05-19 NOTE — Progress Notes (Signed)
[facility] [place],  [address]  Pulmonary Sleep Medicine   Office Visit Note  Patient Name: Charles Mata DOB: May 30, 1951 MRN [id number]  Date of Service: 05/19/2019  Complaints/HPI: Pt is here to follow up on sleep study. His sleep study shows an AHI or 11.4.  It is of note that he had a nadir of 86%.  His heart rate remained between 64-104 bpm.  He should get a titration study to assess for appropriate pressure setting for his new machine.     ROS  General: (-) fever, (-) chills, (-) night sweats, (-) weakness Skin: (-) rashes, (-) itching,. Eyes: (-) visual changes, (-) redness, (-) itching. Nose and Sinuses: (-) nasal stuffiness or itchiness, (-) postnasal drip, (-) nosebleeds, (-) sinus trouble. Mouth and Throat: (-) sore throat, (-) hoarseness. Neck: (-) swollen glands, (-) enlarged thyroid, (-) neck pain. Respiratory: - cough, (-) bloody sputum, - shortness of breath, - wheezing. Cardiovascular: - ankle swelling, (-) chest pain. Lymphatic: (-) lymph node enlargement. Neurologic: (-) numbness, (-) tingling. Psychiatric: (-) anxiety, (-) depression   Current Medication: Outpatient Encounter Medications as of 05/19/2019  Medication Sig  . aspirin EC 81 MG tablet Take by mouth.  . CVS VITAMIN B12 1000 MCG tablet TAKE 1 TABLET (1,000 MCG TOTAL) BY MOUTH ONCE DAILY.  Marland Kitchen EPINEPHrine (EPIPEN 2-PAK) 0.3 mg/0.3 mL IJ SOAJ injection Inject 0.3 mLs (0.3 mg total) into the muscle as needed for anaphylaxis.  . fluticasone (FLOVENT HFA) 220 MCG/ACT inhaler Inhale 1-2 puffs into the lungs 2 (two) times daily.  . furosemide (LASIX) 40 MG tablet TAKE 1 TABLET (40 MG TOTAL) BY MOUTH ONCE DAILY.  Marland Kitchen glucose blood (CONTOUR NEXT TEST) test strip   . glyBURIDE (DIABETA) 5 MG tablet TAKE 1 TABLET BY MOUTH EVERY DAY WITH BREAKFAST  . Ipratropium-Albuterol (COMBIVENT RESPIMAT) 20-100 MCG/ACT AERS respimat INHALE 1 PUFF BY MOUTH 4 TIMES A DAY AS NEEDED  .  ipratropium-albuterol (DUONEB) 0.5-2.5 (3) MG/3ML SOLN Take 3 mLs by nebulization every 4 (four) hours as needed.  . linaclotide (LINZESS) 72 MCG capsule Take 72 mcg by mouth daily before breakfast.  . lisinopril (PRINIVIL,ZESTRIL) 5 MG tablet Take by mouth.  . Misc. Devices MISC by Does not apply route. cpap  . montelukast (SINGULAIR) 10 MG tablet TAKE 1 TABLET BY MOUTH EVERY DAY  . omeprazole (PRILOSEC) 40 MG capsule TAKE 1 CAPSULE BY MOUTH EVERY DAY  . oxymetazoline (NASAL RELIEF) 0.05 % nasal spray Place into the nose.  Marland Kitchen PROCTOSOL HC 2.5 % rectal cream APPLY TO AFFECTED AREA 3 TIMES A DAY  . rosuvastatin (CRESTOR) 10 MG tablet Take 5 mg by mouth daily.  . tamsulosin (FLOMAX) 0.4 MG CAPS capsule TAKE ONE CAPSULE BY MOUTH EVERY DAY 1/2 HOUR AFTER A MEAL  . Vitamin D, Ergocalciferol, (DRISDOL) 50000 units CAPS capsule TAKE ONE CAPSULE BY MOUTH ONE TIME PER WEEK   No facility-administered encounter medications on file as of 05/19/2019.     Surgical History: Past Surgical History:  Procedure Laterality Date  . COLONOSCOPY    . heart catheriation      Medical History: Past Medical History:  Diagnosis Date  . Asthma   . Diabetes ([place])   . Hyperlipidemia   . Hypertension   . Sickle cell trait ([place])   . Sleep apnea     Family History: History reviewed. No pertinent family history.  Social History: Social History   Socioeconomic History  . Marital status: Widowed  Spouse name: Not on file  . Number of children: Not on file  . Years of education: Not on file  . Highest education level: Not on file  Occupational History  . Not on file  Social Needs  . Financial resource strain: Not on file  . Food insecurity    Worry: Not on file    Inability: Not on file  . Transportation needs    Medical: Not on file    Non-medical: Not on file  Tobacco Use  . Smoking status: Former Research scientist (life sciences)  . Smokeless tobacco: Never Used  Substance and Sexual Activity  . Alcohol use: Not  Currently    Alcohol/week: 3.0 standard drinks    Types: 3 Glasses of wine per week    Frequency: Never    Comment: a night  . Drug use: No  . Sexual activity: Not on file  Lifestyle  . Physical activity    Days per week: Not on file    Minutes per session: Not on file  . Stress: Not on file  Relationships  . Social Herbalist on phone: Not on file    Gets together: Not on file    Attends religious service: Not on file    Active member of club or organization: Not on file    Attends meetings of clubs or organizations: Not on file    Relationship status: Not on file  . Intimate partner violence    Fear of current or ex partner: Not on file    Emotionally abused: Not on file    Physically abused: Not on file    Forced sexual activity: Not on file  Other Topics Concern  . Not on file  Social History Narrative  . Not on file    Vital Signs: Blood pressure 120/82, pulse 70, temperature (!) 97.2 F (36.2 C), resp. rate 16, height 5\' 8"  (1.727 m), weight 238 lb (108 kg), SpO2 97 %.  Examination: General Appearance: The patient is well-developed, well-nourished, and in no distress. Skin: Gross inspection of skin unremarkable. Head: normocephalic, no gross deformities. Eyes: no gross deformities noted. ENT: ears appear grossly normal no exudates. Neck: Supple. No thyromegaly. No LAD. Respiratory: clear bilaterally. Cardiovascular: Normal S1 and S2 without murmur or rub. Extremities: No cyanosis. pulses are equal. Neurologic: Alert and oriented. No involuntary movements.  LABS: Recent Results (from the past 2160 hour(s))  Pulmonary function test     Status: None   Collection Time: 03/05/19 12:00 PM  Result Value Ref Range   FEV1     FVC     FEV1/FVC     TLC     DLCO      Radiology: Dg Chest 2 View  Result Date: 02/24/2018 CLINICAL DATA:  68 year old male with respiratory distress. EXAM: CHEST - 2 VIEW COMPARISON:  Chest radiograph dated 08/31/2009  FINDINGS: The heart size and mediastinal contours are within normal limits. Both lungs are clear. The visualized skeletal structures are unremarkable. IMPRESSION: No active cardiopulmonary disease. Electronically Signed   By: Anner Crete M.D.   On: 02/24/2018 00:24    No results found.  No results found.    Assessment and Plan: Patient Active Problem List   Diagnosis Date Noted  . Benign paroxysmal positional vertigo 11/30/2016  . Chronic midline low back pain without sciatica 10/07/2015  . Type 2 diabetes mellitus, controlled ([place]) 11/26/2014  . COPD, mild ([place]) 01/29/2014  . Sleep apnea 01/29/2014  . Hyperlipemia 01/16/2014  .  Hypertension 01/16/2014   1. OSA on CPAP Will get titration study and then follow up with patient, and order cpap machine.  - Cpap titration; Future  2. Obstructive chronic bronchitis without exacerbation ([facility]) Stable, provided sample of [place].   3. Seasonal allergic rhinitis due to pollen Stable, continue to use allergy medications as directed.   4. Hypertension, unspecified type Stable, continue present management.     General Counseling: I have discussed the findings of the evaluation and examination with Annie Main.  I have also discussed any further diagnostic evaluation thatmay be needed or ordered today. Str[place] verbalizes understanding of the findings of todays visit. We also reviewed his medications today and discussed drug interactions and side effects including but not limited excessive drowsiness and altered mental states. We also discussed that there is always a risk not just to him but also people around him. he has been encouraged to call the office with any questions or concerns that should arise related to todays visit.    Time spent: 15  I have personally obtained a history, examined the patient, evaluated laboratory and imaging results, formulated the assessment and plan and placed orders.    [name], MD [facility] Pulmonary  and Critical Care Sleep medicine

## 2019-05-21 ENCOUNTER — Ambulatory Visit: Payer: PPO

## 2019-05-21 ENCOUNTER — Telehealth: Payer: Self-pay

## 2019-05-21 NOTE — Telephone Encounter (Signed)
Pt called stating that the anoro ellipta made him feel very sick.  Spoke with adam and adam advised he try symbicort again and rinse his mouth out well before and after and see if that helps him. Advised pt to called back if he has any hoarseness.

## 2019-05-22 DIAGNOSIS — R140 Abdominal distension (gaseous): Secondary | ICD-10-CM | POA: Diagnosis not present

## 2019-05-22 DIAGNOSIS — K219 Gastro-esophageal reflux disease without esophagitis: Secondary | ICD-10-CM | POA: Diagnosis not present

## 2019-05-22 DIAGNOSIS — K449 Diaphragmatic hernia without obstruction or gangrene: Secondary | ICD-10-CM | POA: Diagnosis not present

## 2019-05-22 DIAGNOSIS — R198 Other specified symptoms and signs involving the digestive system and abdomen: Secondary | ICD-10-CM | POA: Diagnosis not present

## 2019-05-22 DIAGNOSIS — R1084 Generalized abdominal pain: Secondary | ICD-10-CM | POA: Diagnosis not present

## 2019-05-23 ENCOUNTER — Other Ambulatory Visit: Payer: Self-pay | Admitting: Student

## 2019-05-23 DIAGNOSIS — R140 Abdominal distension (gaseous): Secondary | ICD-10-CM

## 2019-05-23 DIAGNOSIS — R1084 Generalized abdominal pain: Secondary | ICD-10-CM

## 2019-05-28 ENCOUNTER — Ambulatory Visit: Payer: PPO

## 2019-05-28 ENCOUNTER — Ambulatory Visit (INDEPENDENT_AMBULATORY_CARE_PROVIDER_SITE_OTHER): Payer: PPO | Admitting: Internal Medicine

## 2019-05-28 ENCOUNTER — Other Ambulatory Visit: Payer: Self-pay

## 2019-05-28 DIAGNOSIS — G4733 Obstructive sleep apnea (adult) (pediatric): Secondary | ICD-10-CM | POA: Diagnosis not present

## 2019-05-28 DIAGNOSIS — J301 Allergic rhinitis due to pollen: Secondary | ICD-10-CM

## 2019-05-28 DIAGNOSIS — Z9989 Dependence on other enabling machines and devices: Secondary | ICD-10-CM

## 2019-05-28 DIAGNOSIS — K760 Fatty (change of) liver, not elsewhere classified: Secondary | ICD-10-CM | POA: Diagnosis not present

## 2019-06-03 ENCOUNTER — Other Ambulatory Visit: Payer: Self-pay

## 2019-06-03 MED ORDER — BUDESONIDE-FORMOTEROL FUMARATE 160-4.5 MCG/ACT IN AERO: 2.0000 | INHALATION_SPRAY | Freq: Two times a day (BID) | RESPIRATORY_TRACT | 3 refills | Status: DC

## 2019-06-04 ENCOUNTER — Ambulatory Visit: Payer: PPO

## 2019-06-04 ENCOUNTER — Ambulatory Visit
Admission: RE | Admit: 2019-06-04 | Discharge: 2019-06-04 | Disposition: A | Discharge status: Home / Self Care | Payer: PPO | Source: Ambulatory Visit | Attending: Student | Admitting: Student

## 2019-06-04 ENCOUNTER — Other Ambulatory Visit: Payer: Self-pay

## 2019-06-04 DIAGNOSIS — J301 Allergic rhinitis due to pollen: Secondary | ICD-10-CM | POA: Diagnosis not present

## 2019-06-04 DIAGNOSIS — R140 Abdominal distension (gaseous): Secondary | ICD-10-CM | POA: Insufficient documentation

## 2019-06-04 DIAGNOSIS — R1084 Generalized abdominal pain: Secondary | ICD-10-CM | POA: Diagnosis not present

## 2019-06-04 DIAGNOSIS — K5730 Diverticulosis of large intestine without perforation or abscess without bleeding: Secondary | ICD-10-CM | POA: Diagnosis not present

## 2019-06-04 DIAGNOSIS — N281 Cyst of kidney, acquired: Secondary | ICD-10-CM | POA: Diagnosis not present

## 2019-06-04 LAB — POCT I-STAT CREATININE: Creatinine, Ser: 1.2 mg/dL (ref 0.61–1.24)

## 2019-06-04 MED ORDER — IOHEXOL 300 MG/ML  SOLN
100.0000 mL | Freq: Once | INTRAMUSCULAR | Status: AC | PRN
  Administered 2019-06-04: 100 mL via INTRAVENOUS

## 2019-06-11 DIAGNOSIS — K449 Diaphragmatic hernia without obstruction or gangrene: Secondary | ICD-10-CM | POA: Diagnosis not present

## 2019-06-11 DIAGNOSIS — K760 Fatty (change of) liver, not elsewhere classified: Secondary | ICD-10-CM | POA: Diagnosis not present

## 2019-06-11 DIAGNOSIS — R140 Abdominal distension (gaseous): Secondary | ICD-10-CM | POA: Diagnosis not present

## 2019-06-11 DIAGNOSIS — K219 Gastro-esophageal reflux disease without esophagitis: Secondary | ICD-10-CM | POA: Diagnosis not present

## 2019-06-11 DIAGNOSIS — R1084 Generalized abdominal pain: Secondary | ICD-10-CM | POA: Diagnosis not present

## 2019-06-11 DIAGNOSIS — K5909 Other constipation: Secondary | ICD-10-CM | POA: Diagnosis not present

## 2019-06-12 ENCOUNTER — Encounter: Payer: Self-pay | Admitting: Internal Medicine

## 2019-06-12 ENCOUNTER — Ambulatory Visit: Payer: PPO | Admitting: Internal Medicine

## 2019-06-12 ENCOUNTER — Ambulatory Visit: Payer: PPO

## 2019-06-12 ENCOUNTER — Other Ambulatory Visit: Payer: Self-pay

## 2019-06-12 VITALS — BP 120/80 | HR 76 | Temp 97.3°F | Resp 16 | Ht 68.0 in | Wt 242.0 lb

## 2019-06-12 DIAGNOSIS — I10 Essential (primary) hypertension: Secondary | ICD-10-CM

## 2019-06-12 DIAGNOSIS — J301 Allergic rhinitis due to pollen: Secondary | ICD-10-CM

## 2019-06-12 DIAGNOSIS — R0602 Shortness of breath: Secondary | ICD-10-CM | POA: Diagnosis not present

## 2019-06-12 DIAGNOSIS — J449 Chronic obstructive pulmonary disease, unspecified: Secondary | ICD-10-CM

## 2019-06-12 DIAGNOSIS — F1020 Alcohol dependence, uncomplicated: Secondary | ICD-10-CM

## 2019-06-12 DIAGNOSIS — G4733 Obstructive sleep apnea (adult) (pediatric): Secondary | ICD-10-CM

## 2019-06-12 NOTE — Progress Notes (Signed)
[facility] [place], [place] [address]  Pulmonary Sleep Medicine   Office Visit Note  Patient Name: Charles Mata DOB: 1950/10/07 MRN [id number]  Date of Service: 06/12/2019  Complaints/HPI: Patient is here for follow-up after a CPAP titration.  He had a CPAP titration done which shows good response to therapy patient actually had improvement at the end of the apnea hypopnea index and waning symptoms.  Spoke with him at length about compliance necessary to achieve goals of care.  Patient states he is going to be compliant with his CPAP taking will be best with an auto titrating device although the CPAP showed a pressure of 9.  Also I spoke to him about his ongoing alcohol habit is now definitely going to try to quit he apparently saw a counselor and was told to gradually wean himself off of the alcohol  ROS  General: (-) fever, (-) chills, (-) night sweats, (-) weakness Skin: (-) rashes, (-) itching,. Eyes: (-) visual changes, (-) redness, (-) itching. Nose and Sinuses: (-) nasal stuffiness or itchiness, (-) postnasal drip, (-) nosebleeds, (-) sinus trouble. Mouth and Throat: (-) sore throat, (-) hoarseness. Neck: (-) swollen glands, (-) enlarged thyroid, (-) neck pain. Respiratory: - cough, (-) bloody sputum, + shortness of breath, - wheezing. Cardiovascular: - ankle swelling, (-) chest pain. Lymphatic: (-) lymph node enlargement. Neurologic: (-) numbness, (-) tingling. Psychiatric: (-) anxiety, (-) depression   Current Medication: Outpatient Encounter Medications as of 06/12/2019  Medication Sig  . aspirin EC 81 MG tablet Take by mouth.  . budesonide-formoterol (SYMBICORT) 160-4.5 MCG/ACT inhaler Inhale 2 puffs into the lungs 2 (two) times daily.  . [employer] VITAMIN B12 1000 MCG tablet TAKE 1 TABLET (1,000 MCG TOTAL) BY MOUTH ONCE DAILY.  Marland Kitchen EPINEPHrine (EPIPEN 2-PAK) 0.3 mg/0.3 mL IJ SOAJ injection Inject 0.3 mLs (0.3 mg total) into the muscle as needed  for anaphylaxis.  . furosemide (LASIX) 40 MG tablet TAKE 1 TABLET (40 MG TOTAL) BY MOUTH ONCE DAILY.  Marland Kitchen glucose blood (CONTOUR NEXT TEST) test strip   . glyBURIDE (DIABETA) 5 MG tablet TAKE 1 TABLET BY MOUTH EVERY DAY WITH BREAKFAST  . Ipratropium-Albuterol (COMBIVENT RESPIMAT) 20-100 MCG/ACT AERS respimat INHALE 1 PUFF BY MOUTH 4 TIMES A DAY AS NEEDED  . ipratropium-albuterol (DUONEB) 0.5-2.5 (3) MG/3ML SOLN Take 3 mLs by nebulization every 4 (four) hours as needed.  . linaclotide (LINZESS) 72 MCG capsule Take 72 mcg by mouth daily before breakfast.  . lisinopril (PRINIVIL,ZESTRIL) 5 MG tablet Take by mouth.  . Misc. Devices MISC by Does not apply route. cpap  . montelukast (SINGULAIR) 10 MG tablet TAKE 1 TABLET BY MOUTH EVERY DAY  . omeprazole (PRILOSEC) 40 MG capsule TAKE 1 CAPSULE BY MOUTH EVERY DAY  . oxymetazoline (NASAL RELIEF) 0.05 % nasal spray Place into the nose.  Marland Kitchen PROCTOSOL HC 2.5 % rectal cream APPLY TO AFFECTED AREA 3 TIMES A DAY  . rosuvastatin (CRESTOR) 10 MG tablet Take 5 mg by mouth daily.  . tamsulosin (FLOMAX) 0.4 MG CAPS capsule TAKE ONE CAPSULE BY MOUTH EVERY DAY 1/2 HOUR AFTER A MEAL  . Vitamin D, Ergocalciferol, (DRISDOL) 50000 units CAPS capsule TAKE ONE CAPSULE BY MOUTH ONE TIME PER WEEK  . fluticasone (FLOVENT HFA) 220 MCG/ACT inhaler Inhale 1-2 puffs into the lungs 2 (two) times daily. (Patient not taking: Reported on 06/12/2019)   No facility-administered encounter medications on file as of 06/12/2019.     Surgical History: Past Surgical History:  Procedure Laterality Date  . COLONOSCOPY    . heart catheriation      Medical History: Past Medical History:  Diagnosis Date  . Asthma   . Diabetes ([place])   . Hyperlipidemia   . Hypertension   . Sickle cell trait ([place])   . Sleep apnea     Family History: History reviewed. No pertinent family history.  Social History: Social History   Socioeconomic History  . Marital status: Widowed    Spouse name:  Not on file  . Number of children: Not on file  . Years of education: Not on file  . Highest education level: Not on file  Occupational History  . Not on file  Social Needs  . Financial resource strain: Not on file  . Food insecurity    Worry: Not on file    Inability: Not on file  . Transportation needs    Medical: Not on file    Non-medical: Not on file  Tobacco Use  . Smoking status: Former Research scientist (life sciences)  . Smokeless tobacco: Never Used  Substance and Sexual Activity  . Alcohol use: Not Currently    Alcohol/week: 3.0 standard drinks    Types: 3 Glasses of wine per week    Frequency: Never    Comment: a night  . Drug use: No  . Sexual activity: Not on file  Lifestyle  . Physical activity    Days per week: Not on file    Minutes per session: Not on file  . Stress: Not on file  Relationships  . Social Herbalist on phone: Not on file    Gets together: Not on file    Attends religious service: Not on file    Active member of club or organization: Not on file    Attends meetings of clubs or organizations: Not on file    Relationship status: Not on file  . Intimate partner violence    Fear of current or ex partner: Not on file    Emotionally abused: Not on file    Physically abused: Not on file    Forced sexual activity: Not on file  Other Topics Concern  . Not on file  Social History Narrative  . Not on file    Vital Signs: Blood pressure 120/80, pulse 76, temperature (!) 97.3 F (36.3 C), resp. rate 16, height 5\' 8"  (1.727 m), weight 242 lb (109.8 kg), SpO2 99 %.  Examination: General Appearance: The patient is well-developed, well-nourished, and in no distress. Skin: Gross inspection of skin unremarkable. Head: normocephalic, no gross deformities. Eyes: no gross deformities noted. ENT: ears appear grossly normal no exudates. Neck: Supple. No thyromegaly. No LAD. Respiratory: few rhonchi noted. Cardiovascular: Normal S1 and S2 without murmur or  rub. Extremities: No cyanosis. pulses are equal. Neurologic: Alert and oriented. No involuntary movements.  LABS: Recent Results (from the past 2160 hour(s))  I-STAT creatinine     Status: None   Collection Time: 06/04/19 10:14 AM  Result Value Ref Range   Creatinine, Ser 1.20 0.61 - 1.24 mg/dL    Radiology: Ct Abdomen Pelvis W Contrast  Result Date: 06/04/2019 CLINICAL DATA:  68 year old male with constipation and bloating. History of gastritis. EXAM: CT ABDOMEN AND PELVIS WITH CONTRAST TECHNIQUE: Multidetector CT imaging of the abdomen and pelvis was performed using the standard protocol following bolus administration of intravenous contrast. CONTRAST:  118mL OMNIPAQUE IOHEXOL 300 MG/ML  SOLN COMPARISON:  CT of the abdomen pelvis dated 09/25/2013 FINDINGS: Lower  chest: The visualized lung bases are clear. No intra-abdominal free air or free fluid. Hepatobiliary: Apparent diffuse fatty infiltration of the liver. No intrahepatic biliary ductal dilatation. The gallbladder is unremarkable. Pancreas: Unremarkable. No pancreatic ductal dilatation or surrounding inflammatory changes. Spleen: Normal in size without focal abnormality. Adrenals/Urinary Tract: The adrenal glands are unremarkable. There is no hydronephrosis on either side. There is symmetric enhancement and excretion of contrast by both kidneys. Multiple bilateral renal cysts with the largest measuring approximately 7 cm in the interpolar aspect of the left kidney. Several subcentimeter bilateral renal hypodense lesions are too small to characterize. The visualized ureters and urinary bladder appear unremarkable. Stomach/Bowel: Small scattered colonic diverticula without active inflammatory changes. There is no bowel obstruction or active inflammation. The appendix is normal. Vascular/Lymphatic: The abdominal aorta and IVC are unremarkable. Retroaortic left renal vein anatomy. No portal venous gas. There is no adenopathy. Reproductive: The  prostate and seminal vesicles are grossly unremarkable. No pelvic mass. Other: None Musculoskeletal: No acute or significant osseous findings. IMPRESSION: 1. No acute intra-abdominal or pelvic pathology. 2. Colonic diverticulosis. No bowel obstruction or active inflammation. Normal appendix. 3. Fatty liver. 4. Bilateral renal cysts. Electronically Signed   By: Anner Crete M.D.   On: 06/04/2019 13:36    No results found.  Ct Abdomen Pelvis W Contrast  Result Date: 06/04/2019 CLINICAL DATA:  68 year old male with constipation and bloating. History of gastritis. EXAM: CT ABDOMEN AND PELVIS WITH CONTRAST TECHNIQUE: Multidetector CT imaging of the abdomen and pelvis was performed using the standard protocol following bolus administration of intravenous contrast. CONTRAST:  113mL OMNIPAQUE IOHEXOL 300 MG/ML  SOLN COMPARISON:  CT of the abdomen pelvis dated 09/25/2013 FINDINGS: Lower chest: The visualized lung bases are clear. No intra-abdominal free air or free fluid. Hepatobiliary: Apparent diffuse fatty infiltration of the liver. No intrahepatic biliary ductal dilatation. The gallbladder is unremarkable. Pancreas: Unremarkable. No pancreatic ductal dilatation or surrounding inflammatory changes. Spleen: Normal in size without focal abnormality. Adrenals/Urinary Tract: The adrenal glands are unremarkable. There is no hydronephrosis on either side. There is symmetric enhancement and excretion of contrast by both kidneys. Multiple bilateral renal cysts with the largest measuring approximately 7 cm in the interpolar aspect of the left kidney. Several subcentimeter bilateral renal hypodense lesions are too small to characterize. The visualized ureters and urinary bladder appear unremarkable. Stomach/Bowel: Small scattered colonic diverticula without active inflammatory changes. There is no bowel obstruction or active inflammation. The appendix is normal. Vascular/Lymphatic: The abdominal aorta and IVC are  unremarkable. Retroaortic left renal vein anatomy. No portal venous gas. There is no adenopathy. Reproductive: The prostate and seminal vesicles are grossly unremarkable. No pelvic mass. Other: None Musculoskeletal: No acute or significant osseous findings. IMPRESSION: 1. No acute intra-abdominal or pelvic pathology. 2. Colonic diverticulosis. No bowel obstruction or active inflammation. Normal appendix. 3. Fatty liver. 4. Bilateral renal cysts. Electronically Signed   By: Anner Crete M.D.   On: 06/04/2019 13:36      Assessment and Plan: Patient Active Problem List   Diagnosis Date Noted  . Benign paroxysmal positional vertigo 11/30/2016  . Chronic midline low back pain without sciatica 10/07/2015  . Type 2 diabetes mellitus, controlled ([place]) 11/26/2014  . COPD, mild ([place]) 01/29/2014  . Sleep apnea 01/29/2014  . Hyperlipemia 01/16/2014  . Hypertension 01/16/2014    1. OSA he will be set up with CPAP and I have given him prescription for auto titration device 2. Chronic ETOH use again counseled on alcohol abuse and  the importance of cessation in order to try to work on this 3. COPD mild he is on inhalers and I reviewed them and he was instructed on continuing to use the inhalers properly 4. HTN controlled per his primary care team continue with supportive care  General Counseling: I have discussed the findings of the evaluation and examination with Charles Mata.  I have also discussed any further diagnostic evaluation thatmay be needed or ordered today. Charles Mata verbalizes understanding of the findings of todays visit. We also reviewed his medications today and discussed drug interactions and side effects including but not limited excessive drowsiness and altered mental states. We also discussed that there is always a risk not just to him but also people around him. he has been encouraged to call the office with any questions or concerns that should arise related to todays visit.    Time  spent: 15 minutes  I have personally obtained a history, examined the patient, evaluated laboratory and imaging results, formulated the assessment and plan and placed orders.    [name], MD [facility] Pulmonary and Critical Care Sleep medicine

## 2019-06-12 NOTE — Patient Instructions (Signed)

## 2019-06-12 NOTE — Procedures (Signed)
[name], [name]  Patient Name: Charles Mata DOB: 1951-04-07   SLEEP STUDY INTERPRETATION  DATE OF SERVICE: May 28, 2019   SLEEP STUDY HISTORY: This patient is referred to the sleep lab for a baseline Polysomnography. Pertinent history includes a history of diagnosis of excessive daytime somnolence and snoring.  PROCEDURE: This overnight polysomnogram was performed using the Alice 5 acquisition system using the standard diagnostic protocol as outlined by the AASM. This includes 6 channels of EEG, 2 channelscannels of EOG, chin EMG, bilateral anterior tibialis EMG, nasal/oral thermister, PTAF, chest and abdominal wall movements, ECG and pulse oximetry. Apneas and Hypopneas were scored per AASM definition.  SLEEP ARCHITECHTURE: This is a baseline polysomnograph  study. The total recording time was 374.2 minutes and the patients total sleep time is noted to be 150.5 minutes. Sleep onset latency was 31.0 minutes and is prolonged.  Stage R sleep onset latency was 221.0 minutes. Sleep maintenance efficiency was 40.4% and is decreased.  Sleep staging expressed as a percentage of total sleep time demonstrated 21.6% N1, 40.2% N2 and 34.6% N3  sleep. Stage R represents 2.7% of total sleep time. This is decreased.  There were a total of 21 arousals  for an overall arousal index of 8.4 per hour of sleep. PLMS arousal were not noted. Arousals without respiratory events are  noted. This can contribute to sleep architechture disruption.  RESPIRATORY MONITORING:   Patient exhibits no significant evidence of sleep disorderd breathing characterized by 0 central apneas, 0 obstructive apneas and 0 mixed apneas. There were 0 obstructive hypopneas. The total apnea hypopnea index (apneas and hypopneas per hour of sleep) is 0 respiratory events per hour and is within normal limits.  Respiratory monitoring demonstrated mild snoring through the night. There are  a total of 18 snoring episodes representing 1.1% of sleep.   Baseline oxygen saturation during wakefulness was 98% and during NREM sleep averaged 98% through the night. Arterial saturation during REM sleep was 97% through the night. There was no significant  oxygen desaturation with the respiratory events. Arterial oxygen desaturation occurred of at least 4% was noted with a low saturation of 93%. The study was performed off oxygen.  CARDIAC MONITORING:   Average heart rate is 64 during sleep with a high of 93 beats per minute. Malignant arrhythmias were not noted.   CPAP titration: Patient was started on CPAP titration according to the sleep protocol.  Patient was started on CPAP of 5 and increased to a pressure of 9 cm water pressure.  Patient appeared to tolerate the pressures fairly well there were no significant desaturations noted.  Patient did have stage R sleep noted on the highest pressure of 9.  The optimal pressure of 9 cm water pressure was for a total of 149.8 minutes with a minimum 0.5 minutes of sleep noted and and AHI 0/h.  Patient's lower saturations were 93% on the other pressures from 5-8 patient did not achieve adequate sleep.   IMPRESSIONS:  --This overnight polysomnogram demonstrates no significant obstructive sleep apnea with an overall AHI 0 per hour because the patient was adequately titrated on CPAP. --The overall AHI was no worse  during Stage or. --There were associated no significant arterial oxygen desaturations noted with a low saturation of 93% --There was no significant PLMS noted in this study. --There is very mild snoring noted throughout the study.    RECOMMENDATIONS:  --CPAP titration study is adequate to control this patient's  obstructive sleep apnea.  Based on the data it appears the optimal pressure would be CPAP of 9 cm water pressure.  Patient should also be counseled on alcohol use.  Patient does have a history of alcohol use at night.. --Nasal  decongestants and antihistamines may be of help for increased upper airways resistance when present. --Weight loss through dietary and lifestyle modification is recommended in the presence of obesity. --A search for and treatment of any underlying cardiopulmonary disease is      recommended in the presence of oxygen desaturations. --Alternative treatment options if the patient is not willing to use CPAP include oral   appliances as well as surgical intervention which may help in the appropriate patient. --Clinical correlation is recommended. Please feel free to call the office for any further  questions or assistance in the care of this patient.     [name], MD [facility] Pulmonary Critical Care Medicine Sleep medicine

## 2019-06-18 ENCOUNTER — Other Ambulatory Visit: Payer: Self-pay

## 2019-06-18 ENCOUNTER — Ambulatory Visit: Payer: PPO

## 2019-06-18 DIAGNOSIS — J301 Allergic rhinitis due to pollen: Secondary | ICD-10-CM | POA: Diagnosis not present

## 2019-06-19 NOTE — Procedures (Unsigned)
[facility] [address] [place], [place] [address]  Patient Name: Charles Mata DOB: Jun 14, 1951   SLEEP STUDY INTERPRETATION  DATE OF SERVICE: May 06, 2019   SLEEP STUDY HISTORY: This patient is referred to the sleep lab for a baseline Polysomnography. Pertinent history includes a history of diagnosis of excessive daytime somnolence and snoring.  PROCEDURE: This overnight polysomnogram was performed using the Alice 5 acquisition system using the standard diagnostic protocol as outlined by the AASM. This includes 6 channels of EEG, 2 channelscannels of EOG, chin EMG, bilateral anterior tibialis EMG, nasal/oral thermister, PTAF, chest and abdominal wall movements, ECG and pulse oximetry. Apneas and Hypopneas were scored per AASM definition.  SLEEP ARCHITECHTURE: This is a baseline polysomnograph  study. The total recording time was 385.8 minutes and the patients total sleep time is noted to be 73.5 minutes. Sleep onset latency was 133.5 minutes and is prolonged.  Stage R sleep onset latency was 77.0 minutes. Sleep maintenance efficiency was 19.1% and is reduced.  Sleep staging expressed as a percentage of total sleep time demonstrated 6.8% N1, 58.5% N2 and 15.6% N3  sleep. Stage R represents 19.0% of total sleep time. This is reduced.  There were a total of 16 arousals  for an overall arousal index of 13.1 per hour of sleep. PLMS arousal were not noted. Arousals without respiratory events are  noted. This can contribute to sleep architechture disruption.  RESPIRATORY MONITORING:   Patient exhibits some evidence of sleep disorderd breathing characterized by 0 central apneas, 0 obstructive apneas and 1 mixed apneas. There were 13 obstructive hypopneas and 0 RERAs. Most of the apneas/hypopneas were of obstructive variety. The total apnea hypopnea index (apneas and hypopneas per hour of sleep) is 11.4 respiratory events per hour and is mild.  Respiratory monitoring  demonstrated mild snoring through the night. There are a total of 18 snoring episodes representing 4.0% of sleep.   Baseline oxygen saturation during wakefulness was 96% and during NREM sleep averaged 96% through the night. Arterial saturation during REM sleep was 95% through the night. There was significant  oxygen desaturation with the respiratory events. Arterial oxygen desaturation occurred of at least 4% was noted with a low saturation of 89%. The study was performed off oxygen.  CARDIAC MONITORING:   Average heart rate is 65 during sleep with a high of 104 beats per minute. Malignant arrhythmias were not noted.      IMPRESSIONS:  --This overnight polysomnogram demonstrates presence of mild sleep apnea with an overall AHI 11.4 per hour. --The overall AHI was somewhat worse  during Stage R. --There were associated some arterial oxygen desaturations noted with a lowest saturation of 89% --There was no significant PLMS noted in this study. --There is mild snoring noted throughout the study.    RECOMMENDATIONS:  --CPAP titration study is indicated in this case to determine optimal response to therapy. --Nasal decongestants and antihistamines may be of help for increased upper airways resistance when present. --Weight loss through dietary and lifestyle modification is recommended in the presence of obesity. --A search for and treatment of any underlying cardiopulmonary disease is      recommended in the presence of oxygen desaturations. --Alternative treatment options if the patient is not willing to use CPAP include oral   appliances as well as surgical intervention which may help in the appropriate patient. --Clinical correlation is recommended. Please feel free to call the office for any further  questions or assistance in the care of  this patient.     [name], MD [facility] Pulmonary Critical Care Medicine Sleep medicine

## 2019-06-23 ENCOUNTER — Telehealth: Payer: Self-pay

## 2019-06-23 NOTE — Telephone Encounter (Signed)
Left message to reschd his new cpap set up appt date. [name]

## 2019-06-25 ENCOUNTER — Ambulatory Visit: Payer: PPO

## 2019-06-25 ENCOUNTER — Other Ambulatory Visit: Payer: Self-pay

## 2019-06-25 DIAGNOSIS — J301 Allergic rhinitis due to pollen: Secondary | ICD-10-CM | POA: Diagnosis not present

## 2019-07-02 ENCOUNTER — Ambulatory Visit: Payer: PPO

## 2019-07-02 ENCOUNTER — Other Ambulatory Visit: Payer: Self-pay

## 2019-07-02 DIAGNOSIS — J301 Allergic rhinitis due to pollen: Secondary | ICD-10-CM

## 2019-07-07 ENCOUNTER — Telehealth: Payer: Self-pay

## 2019-07-07 NOTE — Telephone Encounter (Signed)
Confirmed appointment with patient. klh °

## 2019-07-09 ENCOUNTER — Other Ambulatory Visit: Payer: Self-pay

## 2019-07-09 ENCOUNTER — Ambulatory Visit: Payer: PPO

## 2019-07-09 ENCOUNTER — Ambulatory Visit (INDEPENDENT_AMBULATORY_CARE_PROVIDER_SITE_OTHER): Payer: PPO

## 2019-07-09 DIAGNOSIS — J301 Allergic rhinitis due to pollen: Secondary | ICD-10-CM | POA: Diagnosis not present

## 2019-07-09 DIAGNOSIS — G4733 Obstructive sleep apnea (adult) (pediatric): Secondary | ICD-10-CM

## 2019-07-09 NOTE — Progress Notes (Signed)
New cpap setup  Resmed auto cpap at 9 cmh2o with a heated humidifier, climate line and a resmed F-30 full face mask in a Med. he understood wearing, cleaning and following up with cpap.

## 2019-07-16 ENCOUNTER — Ambulatory Visit (INDEPENDENT_AMBULATORY_CARE_PROVIDER_SITE_OTHER): Payer: PPO

## 2019-07-16 DIAGNOSIS — J301 Allergic rhinitis due to pollen: Secondary | ICD-10-CM

## 2019-07-17 ENCOUNTER — Telehealth: Payer: Self-pay

## 2019-07-17 NOTE — Telephone Encounter (Signed)
VERBAL ORDER FOR HOME MEDICAL EQUIPIMENT SIGNED AND PLACED IN [employer] FOLDER.

## 2019-07-21 ENCOUNTER — Telehealth: Payer: Self-pay

## 2019-07-22 ENCOUNTER — Other Ambulatory Visit: Payer: Self-pay

## 2019-07-22 MED ORDER — TRELEGY ELLIPTA 100-62.5-25 MCG/INH IN AEPB: 1.0000 | INHALATION_SPRAY | Freq: Every day | RESPIRATORY_TRACT | 1 refills | Status: DC

## 2019-07-22 NOTE — Telephone Encounter (Signed)
D/c symbicort sent in trelegy

## 2019-07-22 NOTE — Telephone Encounter (Signed)
Can we get more information and jody please call me on this

## 2019-07-23 ENCOUNTER — Ambulatory Visit: Payer: PPO

## 2019-07-23 DIAGNOSIS — R042 Hemoptysis: Secondary | ICD-10-CM | POA: Diagnosis not present

## 2019-07-23 DIAGNOSIS — R067 Sneezing: Secondary | ICD-10-CM | POA: Diagnosis not present

## 2019-07-23 DIAGNOSIS — R448 Other symptoms and signs involving general sensations and perceptions: Secondary | ICD-10-CM | POA: Diagnosis not present

## 2019-08-06 ENCOUNTER — Ambulatory Visit: Payer: PPO

## 2019-08-06 ENCOUNTER — Other Ambulatory Visit: Payer: Self-pay

## 2019-08-06 DIAGNOSIS — J301 Allergic rhinitis due to pollen: Secondary | ICD-10-CM

## 2019-08-13 ENCOUNTER — Ambulatory Visit: Payer: PPO

## 2019-08-13 ENCOUNTER — Other Ambulatory Visit: Payer: Self-pay

## 2019-08-13 DIAGNOSIS — J301 Allergic rhinitis due to pollen: Secondary | ICD-10-CM

## 2019-08-20 ENCOUNTER — Other Ambulatory Visit: Payer: Self-pay

## 2019-08-20 ENCOUNTER — Ambulatory Visit: Payer: PPO

## 2019-08-20 DIAGNOSIS — J301 Allergic rhinitis due to pollen: Secondary | ICD-10-CM

## 2019-08-21 DIAGNOSIS — K581 Irritable bowel syndrome with constipation: Secondary | ICD-10-CM | POA: Diagnosis not present

## 2019-08-21 DIAGNOSIS — E7800 Pure hypercholesterolemia, unspecified: Secondary | ICD-10-CM | POA: Diagnosis not present

## 2019-08-21 DIAGNOSIS — E1165 Type 2 diabetes mellitus with hyperglycemia: Secondary | ICD-10-CM | POA: Diagnosis not present

## 2019-08-21 DIAGNOSIS — I10 Essential (primary) hypertension: Secondary | ICD-10-CM | POA: Diagnosis not present

## 2019-08-21 DIAGNOSIS — M25511 Pain in right shoulder: Secondary | ICD-10-CM | POA: Diagnosis not present

## 2019-08-21 DIAGNOSIS — G4733 Obstructive sleep apnea (adult) (pediatric): Secondary | ICD-10-CM | POA: Diagnosis not present

## 2019-08-21 DIAGNOSIS — J449 Chronic obstructive pulmonary disease, unspecified: Secondary | ICD-10-CM | POA: Diagnosis not present

## 2019-08-21 DIAGNOSIS — G8929 Other chronic pain: Secondary | ICD-10-CM | POA: Diagnosis not present

## 2019-08-21 DIAGNOSIS — K5900 Constipation, unspecified: Secondary | ICD-10-CM | POA: Diagnosis not present

## 2019-08-27 ENCOUNTER — Ambulatory Visit: Payer: PPO

## 2019-08-27 ENCOUNTER — Other Ambulatory Visit: Payer: Self-pay

## 2019-08-27 ENCOUNTER — Ambulatory Visit (INDEPENDENT_AMBULATORY_CARE_PROVIDER_SITE_OTHER): Payer: PPO

## 2019-08-27 DIAGNOSIS — J301 Allergic rhinitis due to pollen: Secondary | ICD-10-CM | POA: Diagnosis not present

## 2019-08-27 DIAGNOSIS — G4733 Obstructive sleep apnea (adult) (pediatric): Secondary | ICD-10-CM | POA: Diagnosis not present

## 2019-08-27 NOTE — Progress Notes (Signed)
95 percentile pressure 9   95th percentile leak 6.3   apnea index 0.6 /hr  apnea-hypopnea index  0.9 /hr   total days used  >4 hr 30 days  total days used <4 hr 0 days  Total compliance 100 percent  Doing great no problems or questions

## 2019-08-28 ENCOUNTER — Ambulatory Visit: Payer: PPO | Admitting: Internal Medicine

## 2019-08-29 ENCOUNTER — Telehealth: Payer: Self-pay

## 2019-08-29 NOTE — Telephone Encounter (Signed)
VERBAL ORDER SINGED AND PLACED IN [employer] PATIENT FOLDER.

## 2019-09-02 ENCOUNTER — Telehealth: Payer: Self-pay

## 2019-09-02 NOTE — Telephone Encounter (Signed)
Confirmed appointment with patient and screened for covid. klh 

## 2019-09-04 ENCOUNTER — Ambulatory Visit: Payer: PPO | Admitting: Internal Medicine

## 2019-09-04 ENCOUNTER — Other Ambulatory Visit: Payer: Self-pay

## 2019-09-04 ENCOUNTER — Encounter: Payer: Self-pay | Admitting: Internal Medicine

## 2019-09-04 VITALS — BP 119/67 | HR 83 | Resp 16 | Ht 68.0 in | Wt 241.0 lb

## 2019-09-04 DIAGNOSIS — J449 Chronic obstructive pulmonary disease, unspecified: Secondary | ICD-10-CM | POA: Diagnosis not present

## 2019-09-04 DIAGNOSIS — G4733 Obstructive sleep apnea (adult) (pediatric): Secondary | ICD-10-CM

## 2019-09-04 DIAGNOSIS — J302 Other seasonal allergic rhinitis: Secondary | ICD-10-CM | POA: Diagnosis not present

## 2019-09-04 DIAGNOSIS — Z9989 Dependence on other enabling machines and devices: Secondary | ICD-10-CM

## 2019-09-04 DIAGNOSIS — T887XXA Unspecified adverse effect of drug or medicament, initial encounter: Secondary | ICD-10-CM

## 2019-09-04 NOTE — Progress Notes (Signed)
[facility] [place], [place] [address]  Pulmonary Sleep Medicine   Office Visit Note  Patient Name: Charles Mata DOB: 1951-05-27 MRN [id number]  Date of Service: 09/04/2019  Complaints/HPI: Charles Mata is here today for routine pulmonary follow-up. For his COPD he is currently using Trellegy inhaler daily, he reports many negative side effects. He has had these same side effects with many other inhalers in the past. His negative side effects consist of headaches, body aches, cough, hoarseness and congestion. He reports that his side effects make him feel much worse than he feels without using his inhalers. He recently went a week without using a daily inhaler and was only using his nebulizer and rescue inhaler and felt much better but was nervous to not be on a daily inhaler so he restarted his Trellegy but continued to experience the negative side effects. He also wears a CPAP for OSA, most recent compliance report shows 100% compliance, AHI 0.9. He reports he is cleaning his machine by hand and changing his tubing and filters as needed.  ROS  General: (-) fever, (-) chills, (-) night sweats, (-) weakness Skin: (-) rashes, (-) itching,. Eyes: (-) visual changes, (-) redness, (-) itching. Nose and Sinuses: (-) nasal stuffiness or itchiness, (-) postnasal drip, (-) nosebleeds, (-) sinus trouble. Mouth and Throat: (-) sore throat, (-) hoarseness. Neck: (-) swollen glands, (-) enlarged thyroid, (-) neck pain. Respiratory: - cough, (-) bloody sputum, - shortness of breath, - wheezing. Cardiovascular: - ankle swelling, (-) chest pain. Lymphatic: (-) lymph node enlargement. Neurologic: (-) numbness, (-) tingling. Psychiatric: (-) anxiety, (-) depression   Current Medication: Outpatient Encounter Medications as of 09/04/2019  Medication Sig  . aspirin EC 81 MG tablet Take by mouth.  . CVS VITAMIN B12 1000 MCG tablet TAKE 1 TABLET (1,000 MCG TOTAL) BY MOUTH ONCE  DAILY.  Marland Kitchen EPINEPHrine (EPIPEN 2-PAK) 0.3 mg/0.3 mL IJ SOAJ injection Inject 0.3 mLs (0.3 mg total) into the muscle as needed for anaphylaxis.  Marland Kitchen Fluticasone-Umeclidin-Vilant (TRELEGY ELLIPTA) 100-62.5-25 MCG/INH AEPB Inhale 1 puff into the lungs daily.  . furosemide (LASIX) 40 MG tablet TAKE 1 TABLET (40 MG TOTAL) BY MOUTH ONCE DAILY.  Marland Kitchen glucose blood (CONTOUR NEXT TEST) test strip   . glyBURIDE (DIABETA) 5 MG tablet TAKE 1 TABLET BY MOUTH EVERY DAY WITH BREAKFAST  . Ipratropium-Albuterol (COMBIVENT RESPIMAT) 20-100 MCG/ACT AERS respimat INHALE 1 PUFF BY MOUTH 4 TIMES A DAY AS NEEDED  . ipratropium-albuterol (DUONEB) 0.5-2.5 (3) MG/3ML SOLN Take 3 mLs by nebulization every 4 (four) hours as needed.  . linaclotide (LINZESS) 72 MCG capsule Take 72 mcg by mouth daily before breakfast.  . lisinopril (PRINIVIL,ZESTRIL) 5 MG tablet Take by mouth.  . Misc. Devices MISC by Does not apply route. cpap  . montelukast (SINGULAIR) 10 MG tablet TAKE 1 TABLET BY MOUTH EVERY DAY  . omeprazole (PRILOSEC) 40 MG capsule TAKE 1 CAPSULE BY MOUTH EVERY DAY  . oxymetazoline (NASAL RELIEF) 0.05 % nasal spray Place into the nose.  Marland Kitchen PROCTOSOL HC 2.5 % rectal cream APPLY TO AFFECTED AREA 3 TIMES A DAY  . rosuvastatin (CRESTOR) 10 MG tablet Take 5 mg by mouth daily.  . tamsulosin (FLOMAX) 0.4 MG CAPS capsule TAKE ONE CAPSULE BY MOUTH EVERY DAY 1/2 HOUR AFTER A MEAL  . Vitamin D, Ergocalciferol, (DRISDOL) 50000 units CAPS capsule TAKE ONE CAPSULE BY MOUTH ONE TIME PER WEEK   No facility-administered encounter medications on file as of 09/04/2019.  Surgical History: Past Surgical History:  Procedure Laterality Date  . COLONOSCOPY    . heart catheriation      Medical History: Past Medical History:  Diagnosis Date  . Asthma   . Diabetes ([place])   . Hyperlipidemia   . Hypertension   . Sickle cell trait ([place])   . Sleep apnea     Family History: History reviewed. No pertinent family history.  Social  History: Social History   Socioeconomic History  . Marital status: Widowed    Spouse name: Not on file  . Number of children: Not on file  . Years of education: Not on file  . Highest education level: Not on file  Occupational History  . Not on file  Tobacco Use  . Smoking status: Former Research scientist (life sciences)  . Smokeless tobacco: Never Used  Substance and Sexual Activity  . Alcohol use: Not Currently    Alcohol/week: 3.0 standard drinks    Types: 3 Glasses of wine per week    Comment: a night  . Drug use: No  . Sexual activity: Not on file  Other Topics Concern  . Not on file  Social History Narrative  . Not on file   Social Determinants of Health   Financial Resource Strain:   . Difficulty of Paying Living Expenses: Not on file  Food Insecurity:   . Worried About Charity fundraiser in the Last Year: Not on file  . Ran Out of Food in the Last Year: Not on file  Transportation Needs:   . Lack of Transportation (Medical): Not on file  . Lack of Transportation (Non-Medical): Not on file  Physical Activity:   . Days of Exercise per Week: Not on file  . Minutes of Exercise per Session: Not on file  Stress:   . Feeling of Stress : Not on file  Social Connections:   . Frequency of Communication with Friends and Family: Not on file  . Frequency of Social Gatherings with Friends and Family: Not on file  . Attends Religious Services: Not on file  . Active Member of Clubs or Organizations: Not on file  . Attends Archivist Meetings: Not on file  . Marital Status: Not on file  Intimate Partner Violence:   . Fear of Current or Ex-Partner: Not on file  . Emotionally Abused: Not on file  . Physically Abused: Not on file  . Sexually Abused: Not on file    Vital Signs: Blood pressure 119/67, pulse 83, resp. rate 16, height 5\' 8"  (1.727 m), weight 241 lb (109.3 kg), SpO2 97 %.  Examination: General Appearance: The patient is well-developed, well-nourished, and in no  distress. Skin: Gross inspection of skin unremarkable. Head: normocephalic, no gross deformities. Eyes: no gross deformities noted. ENT: ears appear grossly normal no exudates. Neck: Supple. No thyromegaly. No LAD. Respiratory: Clear lung sounds bilateral. Cardiovascular: Normal S1 and S2 without murmur or rub. Extremities: No cyanosis. pulses are equal. Neurologic: Alert and oriented. No involuntary movements.  LABS: No results found for this or any previous visit (from the past 2160 hour(s)).  Radiology: CT ABDOMEN PELVIS W CONTRAST  Result Date: 06/04/2019 CLINICAL DATA:  69 year old male with constipation and bloating. History of gastritis. EXAM: CT ABDOMEN AND PELVIS WITH CONTRAST TECHNIQUE: Multidetector CT imaging of the abdomen and pelvis was performed using the standard protocol following bolus administration of intravenous contrast. CONTRAST:  183mL OMNIPAQUE IOHEXOL 300 MG/ML  SOLN COMPARISON:  CT of the abdomen pelvis dated 09/25/2013 FINDINGS:  Lower chest: The visualized lung bases are clear. No intra-abdominal free air or free fluid. Hepatobiliary: Apparent diffuse fatty infiltration of the liver. No intrahepatic biliary ductal dilatation. The gallbladder is unremarkable. Pancreas: Unremarkable. No pancreatic ductal dilatation or surrounding inflammatory changes. Spleen: Normal in size without focal abnormality. Adrenals/Urinary Tract: The adrenal glands are unremarkable. There is no hydronephrosis on either side. There is symmetric enhancement and excretion of contrast by both kidneys. Multiple bilateral renal cysts with the largest measuring approximately 7 cm in the interpolar aspect of the left kidney. Several subcentimeter bilateral renal hypodense lesions are too small to characterize. The visualized ureters and urinary bladder appear unremarkable. Stomach/Bowel: Small scattered colonic diverticula without active inflammatory changes. There is no bowel obstruction or active  inflammation. The appendix is normal. Vascular/Lymphatic: The abdominal aorta and IVC are unremarkable. Retroaortic left renal vein anatomy. No portal venous gas. There is no adenopathy. Reproductive: The prostate and seminal vesicles are grossly unremarkable. No pelvic mass. Other: None Musculoskeletal: No acute or significant osseous findings. IMPRESSION: 1. No acute intra-abdominal or pelvic pathology. 2. Colonic diverticulosis. No bowel obstruction or active inflammation. Normal appendix. 3. Fatty liver. 4. Bilateral renal cysts. Electronically Signed   By: Anner Crete M.D.   On: 06/04/2019 13:36    No results found.  No results found.    Assessment and Plan: Patient Active Problem List   Diagnosis Date Noted  . Benign paroxysmal positional vertigo 11/30/2016  . Chronic midline low back pain without sciatica 10/07/2015  . Type 2 diabetes mellitus, controlled ([place]) 11/26/2014  . COPD, mild ([place]) 01/29/2014  . Sleep apnea 01/29/2014  . Hyperlipemia 01/16/2014  . Hypertension 01/16/2014    1. OSA on CPAP Compliance report shows 100% compliance. No complaints with machine or no reports of issues or complications.  2. Chronic obstructive pulmonary disease, unspecified COPD type ([place]) Stable at this time, continue with nebulizer and albuterol inhaler. Will stop daily inhaler use due to negative side effects, closely monitor symptoms. Scheduled closer follow up appointment, 2 months, to monitor for symptoms.  3. Seasonal allergies Stable at this time on current therapy, continue to monitor.  4. Medication side effects Reports headaches, body aches, cough and congestion with use of many different daily inhalers. At this time we discussed stopping all daily inhalers and relying only on nebulizer treatments and albuterol inhaler as needed. Will closely monitor symptoms and follow up with patient in 2 months. Encouraged to restart Trellegy is symptoms worsen or if he finds himself  needing nebulizer and albuterol inhaler frequently throughout the day.  General Counseling: I have discussed the findings of the evaluation and examination with Charles Mata.  I have also discussed any further diagnostic evaluation thatmay be needed or ordered today. Charles Mata verbalizes understanding of the findings of todays visit. We also reviewed his medications today and discussed drug interactions and side effects including but not limited excessive drowsiness and altered mental states. We also discussed that there is always a risk not just to him but also people around him. he has been encouraged to call the office with any questions or concerns that should arise related to todays visit.  No orders of the defined types were placed in this encounter.    Time spent: 25  I have personally obtained a history, examined the patient, evaluated laboratory and imaging results, formulated the assessment and plan and placed orders.    [name], MD [facility] Pulmonary and Critical Care Sleep medicine

## 2019-09-10 ENCOUNTER — Ambulatory Visit (INDEPENDENT_AMBULATORY_CARE_PROVIDER_SITE_OTHER): Payer: PPO

## 2019-09-10 ENCOUNTER — Other Ambulatory Visit: Payer: Self-pay

## 2019-09-10 ENCOUNTER — Ambulatory Visit: Payer: PPO

## 2019-09-10 DIAGNOSIS — J301 Allergic rhinitis due to pollen: Secondary | ICD-10-CM | POA: Diagnosis not present

## 2019-09-15 ENCOUNTER — Ambulatory Visit: Payer: PPO | Admitting: Internal Medicine

## 2019-09-17 ENCOUNTER — Other Ambulatory Visit: Payer: Self-pay

## 2019-09-17 ENCOUNTER — Ambulatory Visit: Payer: PPO

## 2019-09-17 DIAGNOSIS — J301 Allergic rhinitis due to pollen: Secondary | ICD-10-CM

## 2019-09-22 ENCOUNTER — Ambulatory Visit: Payer: PPO | Attending: Internal Medicine

## 2019-09-22 ENCOUNTER — Other Ambulatory Visit: Payer: Self-pay

## 2019-09-22 DIAGNOSIS — Z23 Encounter for immunization: Secondary | ICD-10-CM

## 2019-09-22 NOTE — Progress Notes (Signed)
   Covid-19 Vaccination Clinic  Name:  Charles Mata    MRN: [id number] DOB: 27-Jun-1951  09/22/2019  [name] was observed post Covid-19 immunization for 15 minutes without incidence. He was provided with Vaccine Information Sheet and instruction to access the V-Safe system.   [name] was instructed to call 911 with any severe reactions post vaccine: Marland Kitchen Difficulty breathing  . Swelling of your face and throat  . A fast heartbeat  . A bad rash all over your body  . Dizziness and weakness    Immunizations Administered    Name Date Dose VIS Date Route   Moderna COVID-19 Vaccine 09/22/2019  1:09 PM 0.5 mL 07/15/2019 Intramuscular   Manufacturer: Moderna   Lot: [id number]   [place]:[id number]

## 2019-09-24 ENCOUNTER — Ambulatory Visit: Payer: PPO

## 2019-09-30 ENCOUNTER — Telehealth: Payer: Self-pay

## 2019-09-30 NOTE — Telephone Encounter (Signed)
CONFIRMATION OF VERBAL ORDER SIGNED AND PLACED IN [employer] FOLDER. °

## 2019-10-01 ENCOUNTER — Other Ambulatory Visit: Payer: Self-pay

## 2019-10-01 ENCOUNTER — Ambulatory Visit: Payer: PPO

## 2019-10-01 DIAGNOSIS — J301 Allergic rhinitis due to pollen: Secondary | ICD-10-CM | POA: Diagnosis not present

## 2019-10-07 DIAGNOSIS — G4733 Obstructive sleep apnea (adult) (pediatric): Secondary | ICD-10-CM | POA: Diagnosis not present

## 2019-10-08 ENCOUNTER — Ambulatory Visit: Payer: PPO

## 2019-10-08 DIAGNOSIS — J301 Allergic rhinitis due to pollen: Secondary | ICD-10-CM | POA: Diagnosis not present

## 2019-10-15 ENCOUNTER — Ambulatory Visit: Payer: PPO

## 2019-10-22 ENCOUNTER — Ambulatory Visit: Payer: PPO | Attending: Internal Medicine

## 2019-10-22 DIAGNOSIS — Z23 Encounter for immunization: Secondary | ICD-10-CM | POA: Insufficient documentation

## 2019-10-22 NOTE — Progress Notes (Signed)
   Covid-19 Vaccination Clinic  Name:  MAREON VIVIANO    MRN: [id number] DOB: 1951-05-19  10/22/2019  Mr. Fortune was observed post Covid-19 immunization for 15 minutes without incident. He was provided with Vaccine Information Sheet and instruction to access the V-Safe system.   Mr. Laitinen was instructed to call 911 with any severe reactions post vaccine: Marland Kitchen Difficulty breathing  . Swelling of face and throat  . A fast heartbeat  . A bad rash all over body  . Dizziness and weakness   Immunizations Administered    Name Date Dose VIS Date Route   Moderna COVID-19 Vaccine 10/22/2019 12:29 PM 0.5 mL 07/15/2019 Intramuscular   Manufacturer: Moderna   Lot: [id number]   [place]PO:[id number]

## 2019-10-28 DIAGNOSIS — M545 Low back pain: Secondary | ICD-10-CM | POA: Diagnosis not present

## 2019-10-28 DIAGNOSIS — R3129 Other microscopic hematuria: Secondary | ICD-10-CM | POA: Diagnosis not present

## 2019-10-28 DIAGNOSIS — K449 Diaphragmatic hernia without obstruction or gangrene: Secondary | ICD-10-CM | POA: Diagnosis not present

## 2019-10-28 DIAGNOSIS — H8110 Benign paroxysmal vertigo, unspecified ear: Secondary | ICD-10-CM | POA: Diagnosis not present

## 2019-10-28 DIAGNOSIS — G4733 Obstructive sleep apnea (adult) (pediatric): Secondary | ICD-10-CM | POA: Diagnosis not present

## 2019-10-28 DIAGNOSIS — G8929 Other chronic pain: Secondary | ICD-10-CM | POA: Diagnosis not present

## 2019-10-28 DIAGNOSIS — Z0000 Encounter for general adult medical examination without abnormal findings: Secondary | ICD-10-CM | POA: Diagnosis not present

## 2019-10-28 DIAGNOSIS — I10 Essential (primary) hypertension: Secondary | ICD-10-CM | POA: Diagnosis not present

## 2019-10-28 DIAGNOSIS — J449 Chronic obstructive pulmonary disease, unspecified: Secondary | ICD-10-CM | POA: Diagnosis not present

## 2019-10-28 DIAGNOSIS — E1165 Type 2 diabetes mellitus with hyperglycemia: Secondary | ICD-10-CM | POA: Diagnosis not present

## 2019-10-28 DIAGNOSIS — K219 Gastro-esophageal reflux disease without esophagitis: Secondary | ICD-10-CM | POA: Diagnosis not present

## 2019-11-04 ENCOUNTER — Telehealth: Payer: Self-pay

## 2019-11-04 DIAGNOSIS — G4733 Obstructive sleep apnea (adult) (pediatric): Secondary | ICD-10-CM | POA: Diagnosis not present

## 2019-11-04 NOTE — Telephone Encounter (Signed)
Confirmed appointment on 11/06/2019 and screened for covid. klh  °

## 2019-11-06 ENCOUNTER — Ambulatory Visit: Payer: PPO

## 2019-11-06 ENCOUNTER — Ambulatory Visit: Payer: PPO | Admitting: Internal Medicine

## 2019-11-06 ENCOUNTER — Encounter: Payer: Self-pay | Admitting: Internal Medicine

## 2019-11-06 ENCOUNTER — Other Ambulatory Visit: Payer: Self-pay

## 2019-11-06 VITALS — BP 133/65 | HR 70 | Temp 97.4°F | Resp 16 | Ht 68.0 in | Wt 239.0 lb

## 2019-11-06 DIAGNOSIS — G4733 Obstructive sleep apnea (adult) (pediatric): Secondary | ICD-10-CM

## 2019-11-06 DIAGNOSIS — Z9989 Dependence on other enabling machines and devices: Secondary | ICD-10-CM | POA: Diagnosis not present

## 2019-11-06 DIAGNOSIS — J4520 Mild intermittent asthma, uncomplicated: Secondary | ICD-10-CM | POA: Diagnosis not present

## 2019-11-06 DIAGNOSIS — J301 Allergic rhinitis due to pollen: Secondary | ICD-10-CM

## 2019-11-06 DIAGNOSIS — R0602 Shortness of breath: Secondary | ICD-10-CM

## 2019-11-06 MED ORDER — ALBUTEROL SULFATE (2.5 MG/3ML) 0.083% IN NEBU: 2.5000 mg | INHALATION_SOLUTION | Freq: Four times a day (QID) | RESPIRATORY_TRACT | 1 refills | Status: DC | PRN

## 2019-11-06 MED ORDER — EPINEPHRINE 0.3 MG/0.3ML IJ SOAJ: 0.3000 mg | INTRAMUSCULAR | 3 refills | Status: DC | PRN

## 2019-11-06 NOTE — Progress Notes (Signed)
[facility] [place], [place] [address]  Pulmonary Sleep Medicine   Office Visit Note  Patient Name: Charles Mata DOB: 05/17/1951 MRN [id number]  Date of Service: 11/06/2019  Complaints/HPI: Patient is here for routine pulmonary follow-up. His asthma seems to be well controlled at this time, FEV1 2.7 today. He has not currently been using a maintenance inhaler due to negative side effects. He reports only using his albuterol inhaler right before he exercises on the treadmill.  Sample of stiolto sample per patient request. Allergies are stable at this time, getting routine allergy injections as well as taking Singulair. Denies shortness of breath, cough or chest pain.  ROS  General: (-) fever, (-) chills, (-) night sweats, (-) weakness Skin: (-) rashes, (-) itching,. Eyes: (-) visual changes, (-) redness, (-) itching. Nose and Sinuses: (-) nasal stuffiness or itchiness, (-) postnasal drip, (-) nosebleeds, (-) sinus trouble. Mouth and Throat: (-) sore throat, (-) hoarseness. Neck: (-) swollen glands, (-) enlarged thyroid, (-) neck pain. Respiratory: - cough, (-) bloody sputum, - shortness of breath, - wheezing. Cardiovascular: - ankle swelling, (-) chest pain. Lymphatic: (-) lymph node enlargement. Neurologic: (-) numbness, (-) tingling. Psychiatric: (-) anxiety, (-) depression   Current Medication: Outpatient Encounter Medications as of 11/06/2019  Medication Sig  . aspirin EC 81 MG tablet Take by mouth.  . CVS VITAMIN B12 1000 MCG tablet TAKE 1 TABLET (1,000 MCG TOTAL) BY MOUTH ONCE DAILY.  Marland Kitchen EPINEPHrine (EPIPEN 2-PAK) 0.3 mg/0.3 mL IJ SOAJ injection Inject 0.3 mLs (0.3 mg total) into the muscle as needed for anaphylaxis.  Marland Kitchen Fluticasone-Umeclidin-Vilant (TRELEGY ELLIPTA) 100-62.5-25 MCG/INH AEPB Inhale 1 puff into the lungs daily.  . furosemide (LASIX) 40 MG tablet TAKE 1 TABLET (40 MG TOTAL) BY MOUTH ONCE DAILY.  Marland Kitchen glucose blood (CONTOUR NEXT  TEST) test strip   . glyBURIDE (DIABETA) 5 MG tablet TAKE 1 TABLET BY MOUTH EVERY DAY WITH BREAKFAST  . Ipratropium-Albuterol (COMBIVENT RESPIMAT) 20-100 MCG/ACT AERS respimat INHALE 1 PUFF BY MOUTH 4 TIMES A DAY AS NEEDED  . ipratropium-albuterol (DUONEB) 0.5-2.5 (3) MG/3ML SOLN Take 3 mLs by nebulization every 4 (four) hours as needed.  . linaclotide (LINZESS) 72 MCG capsule Take 72 mcg by mouth daily before breakfast.  . lisinopril (PRINIVIL,ZESTRIL) 5 MG tablet Take by mouth.  . Misc. Devices MISC by Does not apply route. cpap  . montelukast (SINGULAIR) 10 MG tablet TAKE 1 TABLET BY MOUTH EVERY DAY  . omeprazole (PRILOSEC) 40 MG capsule TAKE 1 CAPSULE BY MOUTH EVERY DAY  . oxymetazoline (NASAL RELIEF) 0.05 % nasal spray Place into the nose.  Marland Kitchen PROCTOSOL HC 2.5 % rectal cream APPLY TO AFFECTED AREA 3 TIMES A DAY  . rosuvastatin (CRESTOR) 10 MG tablet Take 5 mg by mouth daily.  . tamsulosin (FLOMAX) 0.4 MG CAPS capsule TAKE ONE CAPSULE BY MOUTH EVERY DAY 1/2 HOUR AFTER A MEAL  . Vitamin D, Ergocalciferol, (DRISDOL) 50000 units CAPS capsule TAKE ONE CAPSULE BY MOUTH ONE TIME PER WEEK   No facility-administered encounter medications on file as of 11/06/2019.    Surgical History: Past Surgical History:  Procedure Laterality Date  . COLONOSCOPY    . heart catheriation      Medical History: Past Medical History:  Diagnosis Date  . Asthma   . Diabetes ([place])   . Hyperlipidemia   . Hypertension   . Sickle cell trait ([place])   . Sleep apnea     Family History: History reviewed. No pertinent  family history.  Social History: Social History   Socioeconomic History  . Marital status: Widowed    Spouse name: Not on file  . Number of children: Not on file  . Years of education: Not on file  . Highest education level: Not on file  Occupational History  . Not on file  Tobacco Use  . Smoking status: Former Research scientist (life sciences)  . Smokeless tobacco: Never Used  Substance and Sexual Activity  .  Alcohol use: Not Currently    Alcohol/week: 3.0 standard drinks    Types: 3 Glasses of wine per week    Comment: a night  . Drug use: No  . Sexual activity: Not on file  Other Topics Concern  . Not on file  Social History Narrative  . Not on file   Social Determinants of Health   Financial Resource Strain:   . Difficulty of Paying Living Expenses:   Food Insecurity:   . Worried About Charity fundraiser in the Last Year:   . Arboriculturist in the Last Year:   Transportation Needs:   . Film/video editor (Medical):   Marland Kitchen Lack of Transportation (Non-Medical):   Physical Activity:   . Days of Exercise per Week:   . Minutes of Exercise per Session:   Stress:   . Feeling of Stress :   Social Connections:   . Frequency of Communication with Friends and Family:   . Frequency of Social Gatherings with Friends and Family:   . Attends Religious Services:   . Active Member of Clubs or Organizations:   . Attends Archivist Meetings:   Marland Kitchen Marital Status:   Intimate Partner Violence:   . Fear of Current or Ex-Partner:   . Emotionally Abused:   Marland Kitchen Physically Abused:   . Sexually Abused:     Vital Signs: Blood pressure 133/65, pulse 70, temperature (!) 97.4 F (36.3 C), resp. rate 16, height 5\' 8"  (1.727 m), weight 239 lb (108.4 kg), SpO2 100 %.  Examination: General Appearance: The patient is well-developed, well-nourished, and in no distress. Skin: Gross inspection of skin unremarkable. Head: normocephalic, no gross deformities. Eyes: no gross deformities noted. ENT: ears appear grossly normal no exudates. Neck: Supple. No thyromegaly. No LAD. Respiratory: Clear lung sounds bilaterally. Cardiovascular: Normal S1 and S2 without murmur or rub. Extremities: No cyanosis. pulses are equal. Neurologic: Alert and oriented. No involuntary movements.  LABS: No results found for this or any previous visit (from the past 2160 hour(s)).  Radiology: CT ABDOMEN PELVIS W  CONTRAST  Result Date: 06/04/2019 CLINICAL DATA:  69 year old male with constipation and bloating. History of gastritis. EXAM: CT ABDOMEN AND PELVIS WITH CONTRAST TECHNIQUE: Multidetector CT imaging of the abdomen and pelvis was performed using the standard protocol following bolus administration of intravenous contrast. CONTRAST:  168mL OMNIPAQUE IOHEXOL 300 MG/ML  SOLN COMPARISON:  CT of the abdomen pelvis dated 09/25/2013 FINDINGS: Lower chest: The visualized lung bases are clear. No intra-abdominal free air or free fluid. Hepatobiliary: Apparent diffuse fatty infiltration of the liver. No intrahepatic biliary ductal dilatation. The gallbladder is unremarkable. Pancreas: Unremarkable. No pancreatic ductal dilatation or surrounding inflammatory changes. Spleen: Normal in size without focal abnormality. Adrenals/Urinary Tract: The adrenal glands are unremarkable. There is no hydronephrosis on either side. There is symmetric enhancement and excretion of contrast by both kidneys. Multiple bilateral renal cysts with the largest measuring approximately 7 cm in the interpolar aspect of the left kidney. Several subcentimeter bilateral renal hypodense lesions  are too small to characterize. The visualized ureters and urinary bladder appear unremarkable. Stomach/Bowel: Small scattered colonic diverticula without active inflammatory changes. There is no bowel obstruction or active inflammation. The appendix is normal. Vascular/Lymphatic: The abdominal aorta and IVC are unremarkable. Retroaortic left renal vein anatomy. No portal venous gas. There is no adenopathy. Reproductive: The prostate and seminal vesicles are grossly unremarkable. No pelvic mass. Other: None Musculoskeletal: No acute or significant osseous findings. IMPRESSION: 1. No acute intra-abdominal or pelvic pathology. 2. Colonic diverticulosis. No bowel obstruction or active inflammation. Normal appendix. 3. Fatty liver. 4. Bilateral renal cysts.  Electronically Signed   By: Anner Crete M.D.   On: 06/04/2019 13:36    No results found.  No results found.    Assessment and Plan: Patient Active Problem List   Diagnosis Date Noted  . Benign paroxysmal positional vertigo 11/30/2016  . Chronic midline low back pain without sciatica 10/07/2015  . Type 2 diabetes mellitus, controlled ([place]) 11/26/2014  . COPD, mild ([place]) 01/29/2014  . Sleep apnea 01/29/2014  . Hyperlipemia 01/16/2014  . Hypertension 01/16/2014    1. OSA on CPAP Reports compliance with his CPAP machine. Cleaning machine by hand and changing tubing and filters as directed. No complications at this time.  2. Mild intermittent chronic asthma without complication Not currently using a maintenance inhaler due to negative side effects. Sample of Stiolto given today, will send prescription if patient has improvement in symptoms with this. Only using his rescue albuterol inhaler before his daily exercise routine of running on the treadmill.  3. Seasonal allergic rhinitis due to pollen Stable at this current time, continue to monitor.  4. SOB (shortness of breath) - Spirometry with graph  General Counseling: I have discussed the findings of the evaluation and examination with Annie Main.  I have also discussed any further diagnostic evaluation thatmay be needed or ordered today. Shad verbalizes understanding of the findings of todays visit. We also reviewed his medications today and discussed drug interactions and side effects including but not limited excessive drowsiness and altered mental states. We also discussed that there is always a risk not just to him but also people around him. he has been encouraged to call the office with any questions or concerns that should arise related to todays visit.  Orders Placed This Encounter  Procedures  . Spirometry with graph    Order Specific Question:   Where should this test be performed?    Answer:   [facility]     Time spent: 50  I have personally obtained a history, examined the patient, evaluated laboratory and imaging results, formulated the assessment and plan and placed orders.    [name], MD [facility] Pulmonary and Critical Care Sleep medicine

## 2019-11-12 ENCOUNTER — Other Ambulatory Visit: Payer: Self-pay

## 2019-11-12 ENCOUNTER — Ambulatory Visit: Payer: PPO

## 2019-11-12 DIAGNOSIS — J301 Allergic rhinitis due to pollen: Secondary | ICD-10-CM | POA: Diagnosis not present

## 2019-11-19 ENCOUNTER — Other Ambulatory Visit: Payer: Self-pay

## 2019-11-19 ENCOUNTER — Ambulatory Visit: Payer: PPO

## 2019-11-19 DIAGNOSIS — J301 Allergic rhinitis due to pollen: Secondary | ICD-10-CM

## 2019-11-26 ENCOUNTER — Other Ambulatory Visit: Payer: Self-pay

## 2019-11-26 ENCOUNTER — Ambulatory Visit: Payer: PPO

## 2019-11-26 DIAGNOSIS — J301 Allergic rhinitis due to pollen: Secondary | ICD-10-CM

## 2019-12-02 DIAGNOSIS — R140 Abdominal distension (gaseous): Secondary | ICD-10-CM | POA: Diagnosis not present

## 2019-12-02 DIAGNOSIS — E1165 Type 2 diabetes mellitus with hyperglycemia: Secondary | ICD-10-CM | POA: Diagnosis not present

## 2019-12-02 DIAGNOSIS — R635 Abnormal weight gain: Secondary | ICD-10-CM | POA: Diagnosis not present

## 2019-12-02 DIAGNOSIS — I10 Essential (primary) hypertension: Secondary | ICD-10-CM | POA: Diagnosis not present

## 2019-12-03 ENCOUNTER — Ambulatory Visit: Payer: PPO

## 2019-12-03 ENCOUNTER — Other Ambulatory Visit: Payer: Self-pay

## 2019-12-03 DIAGNOSIS — J301 Allergic rhinitis due to pollen: Secondary | ICD-10-CM

## 2019-12-05 DIAGNOSIS — G4733 Obstructive sleep apnea (adult) (pediatric): Secondary | ICD-10-CM | POA: Diagnosis not present

## 2019-12-10 ENCOUNTER — Ambulatory Visit: Payer: PPO

## 2019-12-10 ENCOUNTER — Other Ambulatory Visit: Payer: Self-pay

## 2019-12-10 DIAGNOSIS — J301 Allergic rhinitis due to pollen: Secondary | ICD-10-CM

## 2019-12-14 ENCOUNTER — Other Ambulatory Visit: Payer: Self-pay | Admitting: Adult Health

## 2019-12-17 ENCOUNTER — Ambulatory Visit (INDEPENDENT_AMBULATORY_CARE_PROVIDER_SITE_OTHER): Payer: PPO

## 2019-12-17 ENCOUNTER — Other Ambulatory Visit: Payer: Self-pay

## 2019-12-17 ENCOUNTER — Ambulatory Visit: Payer: PPO

## 2019-12-17 DIAGNOSIS — G4733 Obstructive sleep apnea (adult) (pediatric): Secondary | ICD-10-CM | POA: Diagnosis not present

## 2019-12-17 DIAGNOSIS — J301 Allergic rhinitis due to pollen: Secondary | ICD-10-CM | POA: Diagnosis not present

## 2019-12-17 NOTE — Progress Notes (Signed)
95 percentile pressure 9   95th percentile leak 9.5   apnea index 0.6 /hr  apnea-hypopnea index  0.8 /hr   total days used  >4 hr 90 days  total days used <4 hr 0 days  Total compliance 100 percent  He is doing great wearing cpap, went over mask and gave suggestions to keep mask from hurting around nose

## 2019-12-18 ENCOUNTER — Telehealth: Payer: Self-pay

## 2019-12-18 NOTE — Telephone Encounter (Signed)
Confirmation of verbal order for home medical equipment signed and placed in  °[facility] folder. °

## 2019-12-24 ENCOUNTER — Other Ambulatory Visit: Payer: Self-pay

## 2019-12-24 ENCOUNTER — Ambulatory Visit: Payer: PPO

## 2019-12-24 DIAGNOSIS — J301 Allergic rhinitis due to pollen: Secondary | ICD-10-CM | POA: Diagnosis not present

## 2019-12-31 ENCOUNTER — Ambulatory Visit: Payer: PPO

## 2019-12-31 ENCOUNTER — Other Ambulatory Visit: Payer: Self-pay

## 2019-12-31 DIAGNOSIS — J301 Allergic rhinitis due to pollen: Secondary | ICD-10-CM | POA: Diagnosis not present

## 2020-01-04 DIAGNOSIS — G4733 Obstructive sleep apnea (adult) (pediatric): Secondary | ICD-10-CM | POA: Diagnosis not present

## 2020-01-07 ENCOUNTER — Ambulatory Visit: Payer: PPO

## 2020-01-08 ENCOUNTER — Ambulatory Visit: Payer: PPO | Admitting: Internal Medicine

## 2020-01-08 ENCOUNTER — Other Ambulatory Visit: Payer: Self-pay

## 2020-01-08 ENCOUNTER — Encounter: Payer: Self-pay | Admitting: Internal Medicine

## 2020-01-08 ENCOUNTER — Ambulatory Visit: Payer: PPO

## 2020-01-08 VITALS — BP 125/77 | HR 75 | Temp 97.1°F | Resp 16 | Ht 68.0 in | Wt 239.0 lb

## 2020-01-08 DIAGNOSIS — Z6836 Body mass index (BMI) 36.0-36.9, adult: Secondary | ICD-10-CM | POA: Diagnosis not present

## 2020-01-08 DIAGNOSIS — R3129 Other microscopic hematuria: Secondary | ICD-10-CM | POA: Diagnosis not present

## 2020-01-08 DIAGNOSIS — J301 Allergic rhinitis due to pollen: Secondary | ICD-10-CM

## 2020-01-08 DIAGNOSIS — I10 Essential (primary) hypertension: Secondary | ICD-10-CM | POA: Diagnosis not present

## 2020-01-08 DIAGNOSIS — J449 Chronic obstructive pulmonary disease, unspecified: Secondary | ICD-10-CM | POA: Diagnosis not present

## 2020-01-08 DIAGNOSIS — E7800 Pure hypercholesterolemia, unspecified: Secondary | ICD-10-CM | POA: Diagnosis not present

## 2020-01-08 DIAGNOSIS — Z9989 Dependence on other enabling machines and devices: Secondary | ICD-10-CM | POA: Diagnosis not present

## 2020-01-08 DIAGNOSIS — J4520 Mild intermittent asthma, uncomplicated: Secondary | ICD-10-CM | POA: Diagnosis not present

## 2020-01-08 DIAGNOSIS — G4733 Obstructive sleep apnea (adult) (pediatric): Secondary | ICD-10-CM

## 2020-01-08 DIAGNOSIS — E1165 Type 2 diabetes mellitus with hyperglycemia: Secondary | ICD-10-CM | POA: Diagnosis not present

## 2020-01-08 NOTE — Progress Notes (Signed)
[facility] [place], [place] [address]  Pulmonary Sleep Medicine   Office Visit Note  Patient Name: Charles Mata DOB: 1951/05/03 MRN [id number]  Date of Service: 01/08/2020  Complaints/HPI: Pt is here for pulmonary visit.  He currently is getting allergy shots and is doing well with those.  He is currently using Trelegy for COPd. He has osa and is using cpap nightly. Pt reports good compliance with CPAP therapy. Cleaning machine by hand, and changing filters and tubing as directed. Denies headaches, sinus issues, palpitations, or hemoptysis. Blood pressure is well controlled.    ROS  General: (-) fever, (-) chills, (-) night sweats, (-) weakness Skin: (-) rashes, (-) itching,. Eyes: (-) visual changes, (-) redness, (-) itching. Nose and Sinuses: (-) nasal stuffiness or itchiness, (-) postnasal drip, (-) nosebleeds, (-) sinus trouble. Mouth and Throat: (-) sore throat, (-) hoarseness. Neck: (-) swollen glands, (-) enlarged thyroid, (-) neck pain. Respiratory: - cough, (-) bloody sputum, - shortness of breath, - wheezing. Cardiovascular: - ankle swelling, (-) chest pain. Lymphatic: (-) lymph node enlargement. Neurologic: (-) numbness, (-) tingling. Psychiatric: (-) anxiety, (-) depression   Current Medication: Outpatient Encounter Medications as of 01/08/2020  Medication Sig  . albuterol (PROVENTIL) (2.5 MG/3ML) 0.083% nebulizer solution Take 3 mLs (2.5 mg total) by nebulization every 6 (six) hours as needed for wheezing or shortness of breath.  Marland Kitchen aspirin EC 81 MG tablet Take by mouth.  . [facility]S VITAMIN B12 1000 MCG tablet TAKE 1 TABLET (1,000 MCG TOTAL) BY MOUTH ONCE DAILY.  Marland Kitchen EPINEPHrine (EPIPEN 2-PAK) 0.3 mg/0.3 mL IJ SOAJ injection Inject 0.3 mLs (0.3 mg total) into the muscle as needed for anaphylaxis.  . furosemide (LASIX) 40 MG tablet TAKE 1 TABLET (40 MG TOTAL) BY MOUTH ONCE DAILY.  Marland Kitchen glucose blood (CONTOUR NEXT TEST) test strip   . glyBURIDE  (DIABETA) 5 MG tablet TAKE 1 TABLET BY MOUTH EVERY DAY WITH BREAKFAST  . Ipratropium-Albuterol (COMBIVENT RESPIMAT) 20-100 MCG/ACT AERS respimat INHALE 1 PUFF BY MOUTH 4 TIMES A DAY AS NEEDED  . ipratropium-albuterol (DUONEB) 0.5-2.5 (3) MG/3ML SOLN Take 3 mLs by nebulization every 4 (four) hours as needed.  . linaclotide (LINZESS) 72 MCG capsule Take 72 mcg by mouth daily before breakfast.  . lisinopril (PRINIVIL,ZESTRIL) 5 MG tablet Take by mouth.  . Misc. Devices MISC by Does not apply route. cpap  . montelukast (SINGULAIR) 10 MG tablet TAKE 1 TABLET BY MOUTH EVERY DAY  . omeprazole (PRILOSEC) 40 MG capsule TAKE 1 CAPSULE BY MOUTH EVERY DAY  . oxymetazoline (NASAL RELIEF) 0.05 % nasal spray Place into the nose.  Marland Kitchen PROCTOSOL HC 2.5 % rectal cream APPLY TO AFFECTED AREA 3 TIMES A DAY  . rosuvastatin (CRESTOR) 10 MG tablet Take 5 mg by mouth daily.  . tamsulosin (FLOMAX) 0.4 MG CAPS capsule TAKE ONE CAPSULE BY MOUTH EVERY DAY 1/2 HOUR AFTER A MEAL  . TRELEGY ELLIPTA 100-62.5-25 MCG/INH AEPB TAKE 1 PUFF BY MOUTH EVERY DAY  . Vitamin D, Ergocalciferol, (DRISDOL) 50000 units CAPS capsule TAKE ONE CAPSULE BY MOUTH ONE TIME PER WEEK   No facility-administered encounter medications on file as of 01/08/2020.    Surgical History: Past Surgical History:  Procedure Laterality Date  . COLONOSCOPY    . heart catheriation      Medical History: Past Medical History:  Diagnosis Date  . Asthma   . Diabetes ([place])   . Hyperlipidemia   . Hypertension   . Sickle cell trait (  [place])   . Sleep apnea     Family History: History reviewed. No pertinent family history.  Social History: Social History   Socioeconomic History  . Marital status: Widowed    Spouse name: Not on file  . Number of children: Not on file  . Years of education: Not on file  . Highest education level: Not on file  Occupational History  . Not on file  Tobacco Use  . Smoking status: Former Research scientist (life sciences)  . Smokeless tobacco: Never  Used  Substance and Sexual Activity  . Alcohol use: Not Currently    Alcohol/week: 3.0 standard drinks    Types: 3 Glasses of wine per week    Comment: a night  . Drug use: No  . Sexual activity: Not on file  Other Topics Concern  . Not on file  Social History Narrative  . Not on file   Social Determinants of Health   Financial Resource Strain:   . Difficulty of Paying Living Expenses:   Food Insecurity:   . Worried About Charity fundraiser in the Last Year:   . Arboriculturist in the Last Year:   Transportation Needs:   . Film/video editor (Medical):   Marland Kitchen Lack of Transportation (Non-Medical):   Physical Activity:   . Days of Exercise per Week:   . Minutes of Exercise per Session:   Stress:   . Feeling of Stress :   Social Connections:   . Frequency of Communication with Friends and Family:   . Frequency of Social Gatherings with Friends and Family:   . Attends Religious Services:   . Active Member of Clubs or Organizations:   . Attends Archivist Meetings:   Marland Kitchen Marital Status:   Intimate Partner Violence:   . Fear of Current or Ex-Partner:   . Emotionally Abused:   Marland Kitchen Physically Abused:   . Sexually Abused:     Vital Signs: Blood pressure 125/77, pulse 75, temperature (!) 97.1 F (36.2 C), resp. rate 16, height 5\' 8"  (1.727 m), weight 239 lb (108.4 kg), SpO2 95 %.  Examination: General Appearance: The patient is well-developed, well-nourished, and in no distress. Skin: Gross inspection of skin unremarkable. Head: normocephalic, no gross deformities. Eyes: no gross deformities noted. ENT: ears appear grossly normal no exudates. Neck: Supple. No thyromegaly. No LAD. Respiratory: clear bilaterally. Cardiovascular: Normal S1 and S2 without murmur or rub. Extremities: No cyanosis. pulses are equal. Neurologic: Alert and oriented. No involuntary movements.  LABS: No results found for this or any previous visit (from the past 2160  hour(s)).  Radiology: CT ABDOMEN PELVIS W CONTRAST  Result Date: 06/04/2019 CLINICAL DATA:  69 year old male with constipation and bloating. History of gastritis. EXAM: CT ABDOMEN AND PELVIS WITH CONTRAST TECHNIQUE: Multidetector CT imaging of the abdomen and pelvis was performed using the standard protocol following bolus administration of intravenous contrast. CONTRAST:  168mL OMNIPAQUE IOHEXOL 300 MG/ML  SOLN COMPARISON:  CT of the abdomen pelvis dated 09/25/2013 FINDINGS: Lower chest: The visualized lung bases are clear. No intra-abdominal free air or free fluid. Hepatobiliary: Apparent diffuse fatty infiltration of the liver. No intrahepatic biliary ductal dilatation. The gallbladder is unremarkable. Pancreas: Unremarkable. No pancreatic ductal dilatation or surrounding inflammatory changes. Spleen: Normal in size without focal abnormality. Adrenals/Urinary Tract: The adrenal glands are unremarkable. There is no hydronephrosis on either side. There is symmetric enhancement and excretion of contrast by both kidneys. Multiple bilateral renal cysts with the largest measuring approximately 7 cm  in the interpolar aspect of the left kidney. Several subcentimeter bilateral renal hypodense lesions are too small to characterize. The visualized ureters and urinary bladder appear unremarkable. Stomach/Bowel: Small scattered colonic diverticula without active inflammatory changes. There is no bowel obstruction or active inflammation. The appendix is normal. Vascular/Lymphatic: The abdominal aorta and IVC are unremarkable. Retroaortic left renal vein anatomy. No portal venous gas. There is no adenopathy. Reproductive: The prostate and seminal vesicles are grossly unremarkable. No pelvic mass. Other: None Musculoskeletal: No acute or significant osseous findings. IMPRESSION: 1. No acute intra-abdominal or pelvic pathology. 2. Colonic diverticulosis. No bowel obstruction or active inflammation. Normal appendix. 3. Fatty  liver. 4. Bilateral renal cysts. Electronically Signed   By: Anner Crete M.D.   On: 06/04/2019 13:36    No results found.  No results found.    Assessment and Plan: Patient Active Problem List   Diagnosis Date Noted  . Benign paroxysmal positional vertigo 11/30/2016  . Chronic midline low back pain without sciatica 10/07/2015  . Type 2 diabetes mellitus, controlled ([place]) 11/26/2014  . COPD, mild ([place]) 01/29/2014  . Sleep apnea 01/29/2014  . Hyperlipemia 01/16/2014  . Hypertension 01/16/2014    1. OSA on CPAP Continue to use cpap as directed.   2. Chronic obstructive pulmonary disease, unspecified COPD type ([place]) Continue to use trelegy as directed. Good results at this time.   3. Mild intermittent chronic asthma without complication Stable, continue to use rescue inhaler as discussed.  Has not used recently.   4. Seasonal allergic rhinitis due to pollen Controlled, continue allergy injections.   5. Hypertension, unspecified type Controlled, continue current meds.   6. BMI 36.0-36.9,adult comorbidity include HTN and OSA. Obesity Counseling: Risk Assessment: An assessment of behavioral risk factors was made today and includes lack of exercise sedentary lifestyle, lack of portion control and poor dietary habits.  Risk Modification Advice: She was counseled on portion control guidelines. Restricting daily caloric intake to 1600. The detrimental long term effects of obesity on her health and ongoing poor compliance was also discussed with the patient.   General Counseling: I have discussed the findings of the evaluation and examination with Charles Mata.  I have also discussed any further diagnostic evaluation thatmay be needed or ordered today. Charles Mata verbalizes understanding of the findings of todays visit. We also reviewed his medications today and discussed drug interactions and side effects including but not limited excessive drowsiness and altered mental states. We also  discussed that there is always a risk not just to him but also people around him. he has been encouraged to call the office with any questions or concerns that should arise related to todays visit.  No orders of the defined types were placed in this encounter.    Time spent: 30 This patient was seen by Orson Gear AGNP-C in Collaboration with Dr. Devona Konig as a part of collaborative care agreement.   I have personally obtained a history, examined the patient, evaluated laboratory and imaging results, formulated the assessment and plan and placed orders.    [name], MD [facility] Pulmonary and Critical Care Sleep medicine

## 2020-01-14 ENCOUNTER — Ambulatory Visit: Payer: PPO

## 2020-01-14 ENCOUNTER — Other Ambulatory Visit: Payer: Self-pay

## 2020-01-14 DIAGNOSIS — J301 Allergic rhinitis due to pollen: Secondary | ICD-10-CM | POA: Diagnosis not present

## 2020-01-15 ENCOUNTER — Ambulatory Visit: Payer: PPO

## 2020-01-15 DIAGNOSIS — Z6836 Body mass index (BMI) 36.0-36.9, adult: Secondary | ICD-10-CM | POA: Diagnosis not present

## 2020-01-15 DIAGNOSIS — G8929 Other chronic pain: Secondary | ICD-10-CM | POA: Diagnosis not present

## 2020-01-15 DIAGNOSIS — J449 Chronic obstructive pulmonary disease, unspecified: Secondary | ICD-10-CM | POA: Diagnosis not present

## 2020-01-15 DIAGNOSIS — K219 Gastro-esophageal reflux disease without esophagitis: Secondary | ICD-10-CM | POA: Diagnosis not present

## 2020-01-15 DIAGNOSIS — R8290 Unspecified abnormal findings in urine: Secondary | ICD-10-CM | POA: Diagnosis not present

## 2020-01-15 DIAGNOSIS — E1165 Type 2 diabetes mellitus with hyperglycemia: Secondary | ICD-10-CM | POA: Diagnosis not present

## 2020-01-15 DIAGNOSIS — R82998 Other abnormal findings in urine: Secondary | ICD-10-CM | POA: Diagnosis not present

## 2020-01-15 DIAGNOSIS — M545 Low back pain: Secondary | ICD-10-CM | POA: Diagnosis not present

## 2020-01-15 DIAGNOSIS — K449 Diaphragmatic hernia without obstruction or gangrene: Secondary | ICD-10-CM | POA: Diagnosis not present

## 2020-01-15 DIAGNOSIS — I10 Essential (primary) hypertension: Secondary | ICD-10-CM | POA: Diagnosis not present

## 2020-01-21 ENCOUNTER — Other Ambulatory Visit: Payer: Self-pay

## 2020-01-21 ENCOUNTER — Ambulatory Visit: Payer: PPO

## 2020-01-21 DIAGNOSIS — J301 Allergic rhinitis due to pollen: Secondary | ICD-10-CM

## 2020-02-03 ENCOUNTER — Ambulatory Visit: Payer: PPO

## 2020-02-03 ENCOUNTER — Other Ambulatory Visit: Payer: Self-pay

## 2020-02-03 DIAGNOSIS — J301 Allergic rhinitis due to pollen: Secondary | ICD-10-CM | POA: Diagnosis not present

## 2020-02-04 DIAGNOSIS — G4733 Obstructive sleep apnea (adult) (pediatric): Secondary | ICD-10-CM | POA: Diagnosis not present

## 2020-02-10 ENCOUNTER — Other Ambulatory Visit: Payer: Self-pay

## 2020-02-10 ENCOUNTER — Ambulatory Visit: Payer: PPO

## 2020-02-10 DIAGNOSIS — J301 Allergic rhinitis due to pollen: Secondary | ICD-10-CM | POA: Diagnosis not present

## 2020-02-17 ENCOUNTER — Other Ambulatory Visit: Payer: Self-pay

## 2020-02-17 ENCOUNTER — Ambulatory Visit: Payer: PPO

## 2020-02-17 DIAGNOSIS — J301 Allergic rhinitis due to pollen: Secondary | ICD-10-CM

## 2020-02-22 ENCOUNTER — Other Ambulatory Visit: Payer: Self-pay | Admitting: Internal Medicine

## 2020-02-23 DIAGNOSIS — E119 Type 2 diabetes mellitus without complications: Secondary | ICD-10-CM | POA: Diagnosis not present

## 2020-02-24 ENCOUNTER — Ambulatory Visit: Payer: PPO

## 2020-02-25 ENCOUNTER — Ambulatory Visit: Payer: PPO

## 2020-03-02 ENCOUNTER — Ambulatory Visit: Payer: PPO

## 2020-03-02 ENCOUNTER — Other Ambulatory Visit: Payer: Self-pay

## 2020-03-02 DIAGNOSIS — J301 Allergic rhinitis due to pollen: Secondary | ICD-10-CM

## 2020-03-05 DIAGNOSIS — G4733 Obstructive sleep apnea (adult) (pediatric): Secondary | ICD-10-CM | POA: Diagnosis not present

## 2020-03-08 ENCOUNTER — Other Ambulatory Visit: Payer: Self-pay | Admitting: Adult Health

## 2020-03-10 ENCOUNTER — Other Ambulatory Visit: Payer: Self-pay

## 2020-03-10 ENCOUNTER — Ambulatory Visit: Payer: PPO

## 2020-03-10 ENCOUNTER — Ambulatory Visit (INDEPENDENT_AMBULATORY_CARE_PROVIDER_SITE_OTHER): Payer: PPO

## 2020-03-10 DIAGNOSIS — G4733 Obstructive sleep apnea (adult) (pediatric): Secondary | ICD-10-CM | POA: Diagnosis not present

## 2020-03-10 DIAGNOSIS — J301 Allergic rhinitis due to pollen: Secondary | ICD-10-CM | POA: Diagnosis not present

## 2020-03-10 NOTE — Progress Notes (Signed)
95 percentile pressure 9   95th percentile leak 17.4   apnea index 0.7 /hr  apnea-hypopnea index  0.9 /hr   total days used  >4 hr 30 days  total days used <4 hr 0 days  Total compliance 100 percent  He is doing great no problems or questions at this time.

## 2020-03-11 ENCOUNTER — Other Ambulatory Visit: Payer: Self-pay

## 2020-03-14 IMAGING — CT CT ABD-PELV W/ CM
2 of 6 series · 16 of 46 positions shown, 18 images · IV contrast (APPLIED)
Comparison: CT of the abdomen pelvis dated 09/25/2013

CLINICAL DATA: 68-year-old male with constipation and bloating.
History of gastritis.

EXAM:
CT ABDOMEN AND PELVIS WITH CONTRAST
TECHNIQUE: Multidetector CT imaging of the abdomen and pelvis was performed
using the standard protocol following bolus administration of
intravenous contrast.
CONTRAST:  100mL OMNIPAQUE IOHEXOL 300 MG/ML  SOLN

[Series 5: routine abd/pel with 2 · axial · 0.77mm/px · z∈[-474,-44]mm · 13 of 98 slices shown, 15 images]
[im 6/98  soft-tissue]
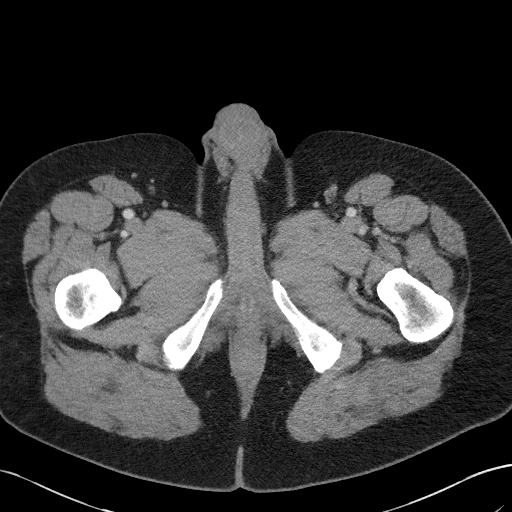
[im 6/98  bone]
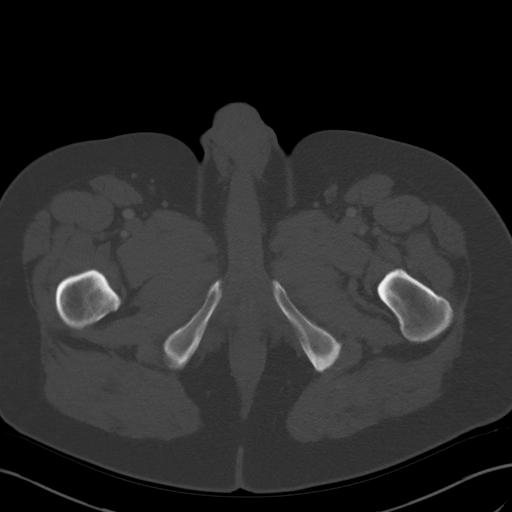
[im 11/98  soft-tissue]
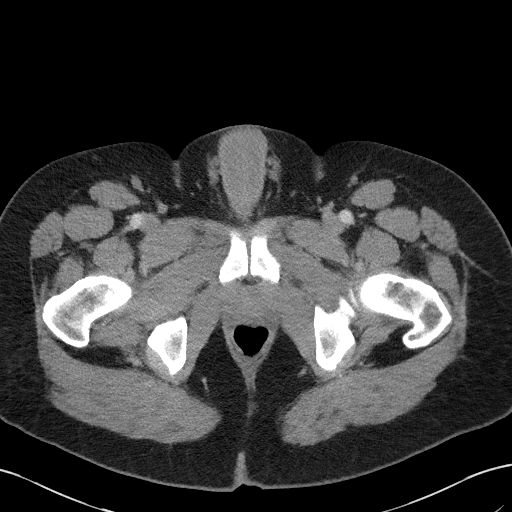
[im 22/98  soft-tissue]
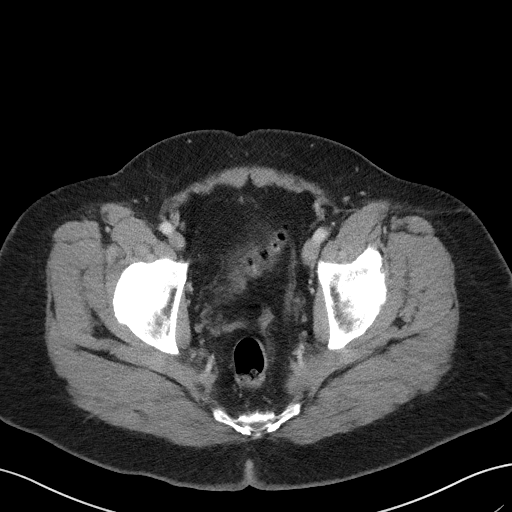
[im 27/98  soft-tissue]
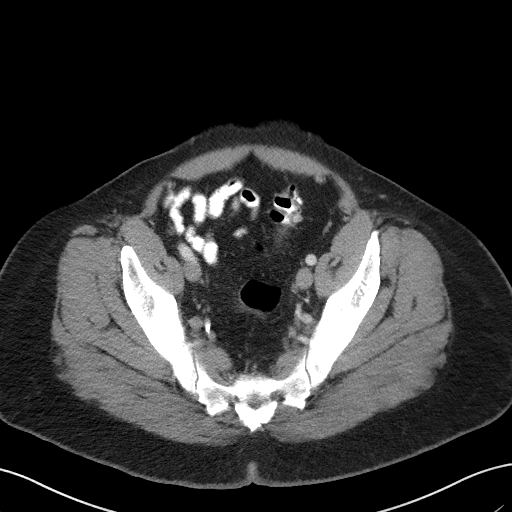
[im 33/98  soft-tissue]
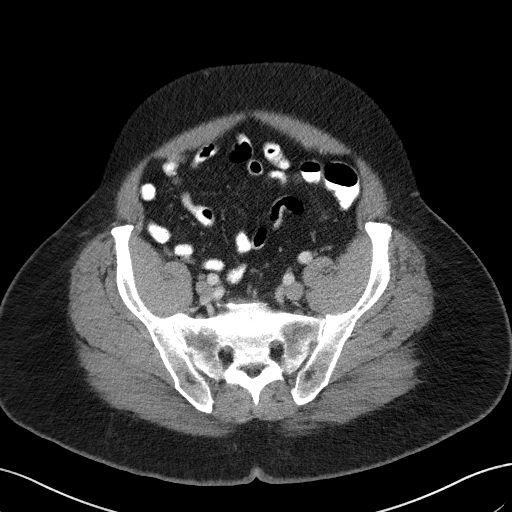
[im 44/98  soft-tissue]
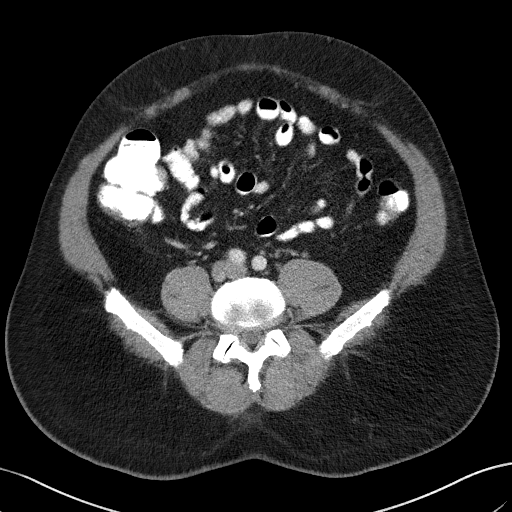
[im 49/98  soft-tissue]
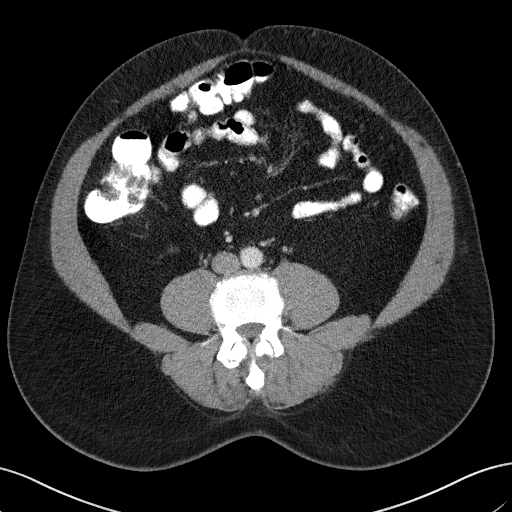
[im 54/98  soft-tissue]
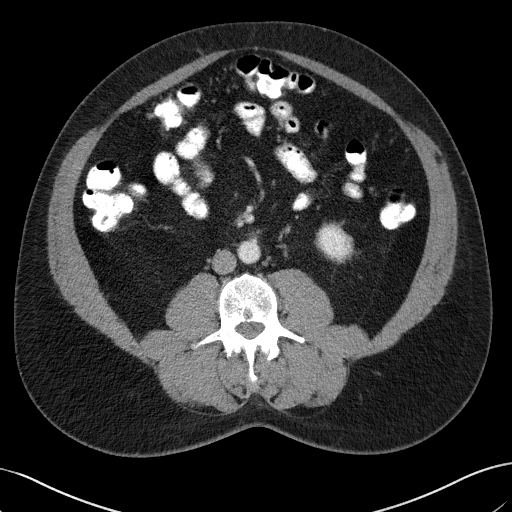
[im 65/98  soft-tissue]
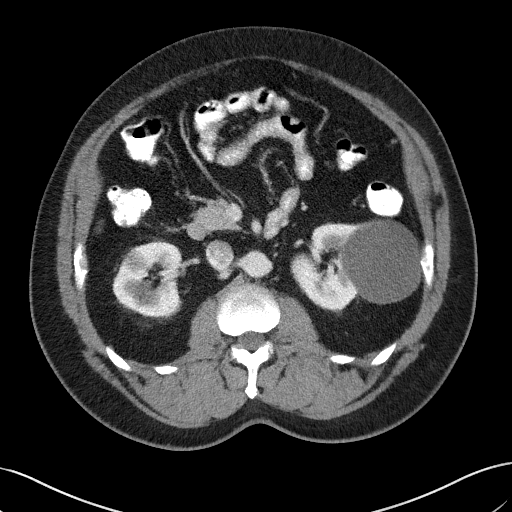
[im 65/98  bone]
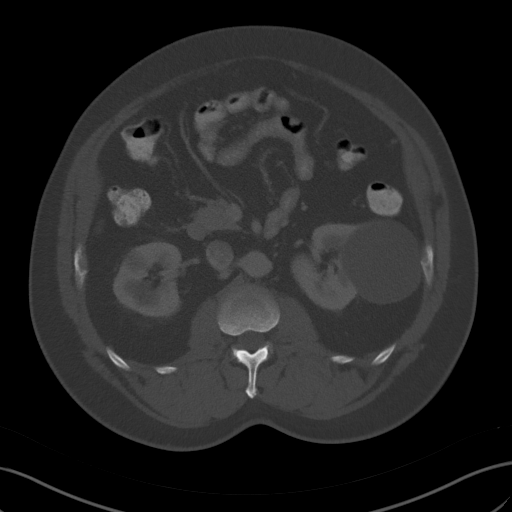
[im 71/98  soft-tissue]
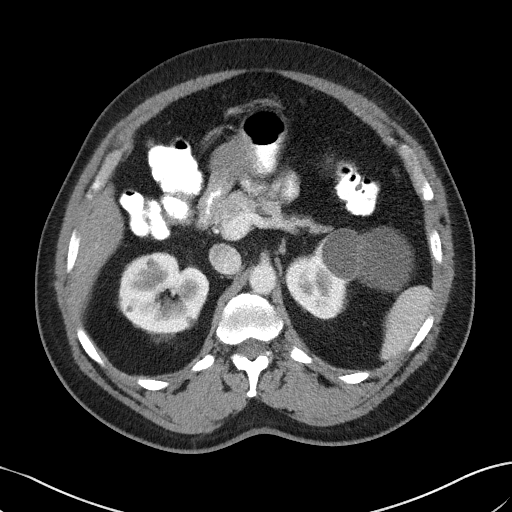
[im 76/98  soft-tissue]
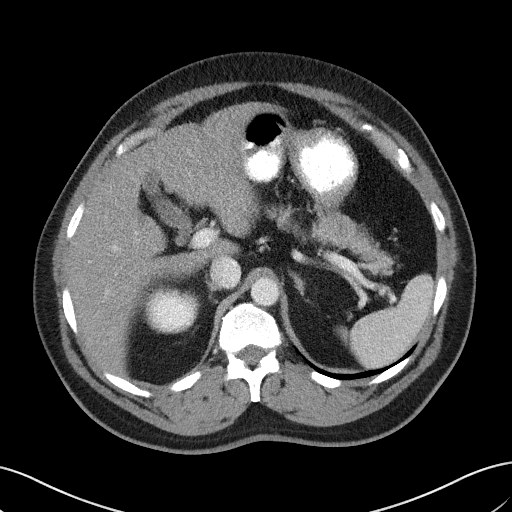
[im 87/98  soft-tissue]
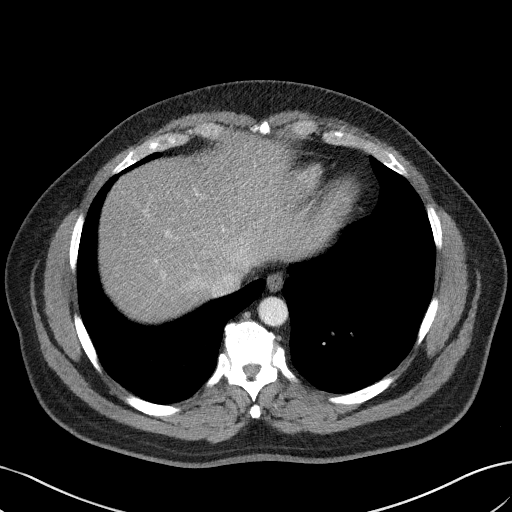
[im 92/98  soft-tissue]
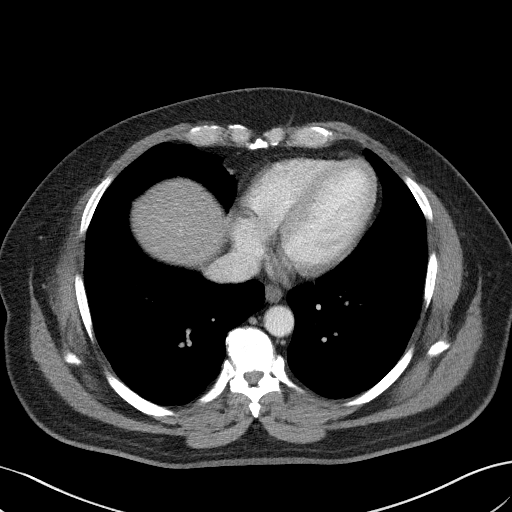

[Series 6: coronal st · coronal · 0.85mm/px · 3 of 115 slices shown]
[im 39/115  soft-tissue]
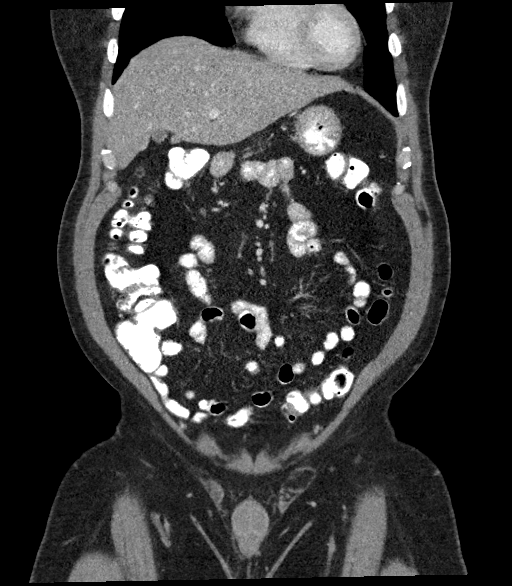
[im 51/115  soft-tissue]
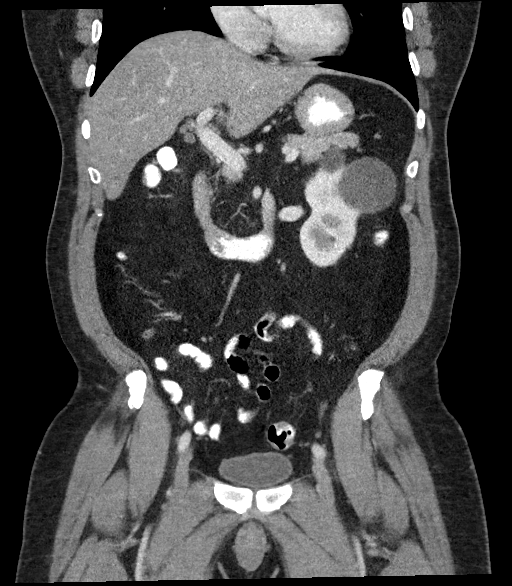
[im 64/115  soft-tissue]
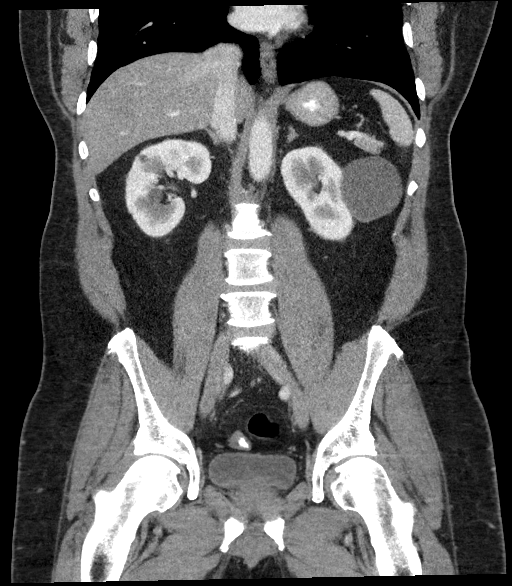

[16 of 46 positions shown; findings below may reference images not displayed]

FINDINGS: Lower chest: The visualized lung bases are clear.

No intra-abdominal free air or free fluid.

Hepatobiliary: Apparent diffuse fatty infiltration of the liver. No
intrahepatic biliary ductal dilatation. The gallbladder is
unremarkable.

Pancreas: Unremarkable. No pancreatic ductal dilatation or
surrounding inflammatory changes.

Spleen: Normal in size without focal abnormality.

Adrenals/Urinary Tract: The adrenal glands are unremarkable. There
is no hydronephrosis on either side. There is symmetric enhancement
and excretion of contrast by both kidneys. Multiple bilateral renal
cysts with the largest measuring approximately 7 cm in the
interpolar aspect of the left kidney. Several subcentimeter
bilateral renal hypodense lesions are too small to characterize. The
visualized ureters and urinary bladder appear unremarkable.

Stomach/Bowel: Small scattered colonic diverticula without active
inflammatory changes. There is no bowel obstruction or active
inflammation. The appendix is normal.

Vascular/Lymphatic: The abdominal aorta and IVC are unremarkable.
Retroaortic left renal vein anatomy. No portal venous gas. There is
no adenopathy.

Reproductive: The prostate and seminal vesicles are grossly
unremarkable. No pelvic mass.

Other: None

Musculoskeletal: No acute or significant osseous findings.
IMPRESSION: 1. No acute intra-abdominal or pelvic pathology.
2. Colonic diverticulosis. No bowel obstruction or active
inflammation. Normal appendix.
3. Fatty liver.
4. Bilateral renal cysts.

## 2020-03-17 ENCOUNTER — Ambulatory Visit: Payer: PPO

## 2020-03-17 ENCOUNTER — Other Ambulatory Visit: Payer: Self-pay

## 2020-03-17 DIAGNOSIS — J301 Allergic rhinitis due to pollen: Secondary | ICD-10-CM

## 2020-03-22 ENCOUNTER — Ambulatory Visit: Payer: PPO

## 2020-03-22 ENCOUNTER — Other Ambulatory Visit: Payer: Self-pay

## 2020-03-22 DIAGNOSIS — J301 Allergic rhinitis due to pollen: Secondary | ICD-10-CM

## 2020-03-24 ENCOUNTER — Ambulatory Visit: Payer: PPO

## 2020-03-29 ENCOUNTER — Ambulatory Visit: Payer: PPO

## 2020-03-31 ENCOUNTER — Other Ambulatory Visit: Payer: Self-pay

## 2020-03-31 ENCOUNTER — Ambulatory Visit: Payer: PPO

## 2020-03-31 DIAGNOSIS — J301 Allergic rhinitis due to pollen: Secondary | ICD-10-CM | POA: Diagnosis not present

## 2020-04-05 ENCOUNTER — Ambulatory Visit: Payer: PPO

## 2020-04-05 DIAGNOSIS — G4733 Obstructive sleep apnea (adult) (pediatric): Secondary | ICD-10-CM | POA: Diagnosis not present

## 2020-04-07 ENCOUNTER — Other Ambulatory Visit: Payer: Self-pay

## 2020-04-07 ENCOUNTER — Ambulatory Visit: Payer: PPO

## 2020-04-07 DIAGNOSIS — J301 Allergic rhinitis due to pollen: Secondary | ICD-10-CM | POA: Diagnosis not present

## 2020-04-08 ENCOUNTER — Ambulatory Visit: Payer: PPO | Admitting: Internal Medicine

## 2020-04-12 ENCOUNTER — Telehealth: Payer: Self-pay

## 2020-04-12 NOTE — Telephone Encounter (Signed)
LMOM for office visit on 9/1

## 2020-04-14 ENCOUNTER — Encounter: Payer: Self-pay | Admitting: Internal Medicine

## 2020-04-14 ENCOUNTER — Ambulatory Visit: Payer: PPO | Admitting: Internal Medicine

## 2020-04-14 ENCOUNTER — Other Ambulatory Visit: Payer: Self-pay

## 2020-04-14 VITALS — BP 113/51 | HR 70 | Temp 98.0°F | Resp 16 | Ht 68.0 in | Wt 241.4 lb

## 2020-04-14 DIAGNOSIS — R202 Paresthesia of skin: Secondary | ICD-10-CM | POA: Diagnosis not present

## 2020-04-14 DIAGNOSIS — J301 Allergic rhinitis due to pollen: Secondary | ICD-10-CM

## 2020-04-14 DIAGNOSIS — R0602 Shortness of breath: Secondary | ICD-10-CM

## 2020-04-14 DIAGNOSIS — R200 Anesthesia of skin: Secondary | ICD-10-CM

## 2020-04-14 NOTE — Progress Notes (Signed)
[facility] [place], [place] [address]  Internal MEDICINE  Office Visit Note  Patient Name: Charles Mata  [id number]  [id number]  Date of Service: 05/31/2020  Chief Complaint  Patient presents with  . Follow-up    inhaler is too expensive, pt needs something cheaper  . Asthma    HPI Pt is here with c/o sob, his inhalers are too expensive, and wants to see if they can be changed. He is also c/o numbness of his hands and feet  and has problem sleeping at night, he is a known diabetic cough and wheezing is present as well. He is on weekly all injections Pt is seen for pulmonary as X coverage    Current Medication: Outpatient Encounter Medications as of 04/14/2020  Medication Sig  . albuterol (PROVENTIL) (2.5 MG/3ML) 0.083% nebulizer solution Take 3 mLs (2.5 mg total) by nebulization every 6 (six) hours as needed for wheezing or shortness of breath.  Marland Kitchen aspirin EC 81 MG tablet Take by mouth.  . CVS VITAMIN B12 1000 MCG tablet TAKE 1 TABLET (1,000 MCG TOTAL) BY MOUTH ONCE DAILY.  Marland Kitchen EPINEPHrine (EPIPEN 2-PAK) 0.3 mg/0.3 mL IJ SOAJ injection Inject 0.3 mLs (0.3 mg total) into the muscle as needed for anaphylaxis.  . furosemide (LASIX) 40 MG tablet TAKE 1 TABLET (40 MG TOTAL) BY MOUTH ONCE DAILY.  Marland Kitchen glucose blood (CONTOUR NEXT TEST) test strip   . glyBURIDE (DIABETA) 5 MG tablet TAKE 1 TABLET BY MOUTH EVERY DAY WITH BREAKFAST  . Ipratropium-Albuterol (COMBIVENT RESPIMAT) 20-100 MCG/ACT AERS respimat INHALE 1 PUFF BY MOUTH 4 TIMES A DAY AS NEEDED  . ipratropium-albuterol (DUONEB) 0.5-2.5 (3) MG/3ML SOLN Take 3 mLs by nebulization every 4 (four) hours as needed.  . linaclotide (LINZESS) 72 MCG capsule Take 72 mcg by mouth daily before breakfast.  . lisinopril (PRINIVIL,ZESTRIL) 5 MG tablet Take by mouth.  . Misc. Devices MISC by Does not apply route. cpap  . montelukast (SINGULAIR) 10 MG tablet TAKE 1 TABLET BY MOUTH EVERY DAY  . omeprazole (PRILOSEC) 40 MG  capsule TAKE 1 CAPSULE BY MOUTH EVERY DAY  . oxymetazoline (NASAL RELIEF) 0.05 % nasal spray Place into the nose.  . rosuvastatin (CRESTOR) 10 MG tablet Take 5 mg by mouth daily.  . tamsulosin (FLOMAX) 0.4 MG CAPS capsule TAKE ONE CAPSULE BY MOUTH EVERY DAY 1/2 HOUR AFTER A MEAL  . Vitamin D, Ergocalciferol, (DRISDOL) 50000 units CAPS capsule TAKE ONE CAPSULE BY MOUTH ONE TIME PER WEEK  . [DISCONTINUED] TRELEGY ELLIPTA 100-62.5-25 MCG/INH AEPB INHALE 1 PUFF BY MOUTH EVERY DAY  . [DISCONTINUED] PROCTOSOL HC 2.5 % rectal cream APPLY TO AFFECTED AREA 3 TIMES A DAY (Patient not taking: Reported on 04/14/2020)   No facility-administered encounter medications on file as of 04/14/2020.    Surgical History: Past Surgical History:  Procedure Laterality Date  . COLONOSCOPY    . heart catheriation      Medical History: Past Medical History:  Diagnosis Date  . Asthma   . Diabetes ([place])   . Hyperlipidemia   . Hypertension   . Sickle cell trait ([place])   . Sleep apnea     Family History: History reviewed. No pertinent family history.  Social History   Socioeconomic History  . Marital status: Widowed    Spouse name: Not on file  . Number of children: Not on file  . Years of education: Not on file  . Highest education level: Not on file  Occupational History  .  Not on file  Tobacco Use  . Smoking status: Former Research scientist (life sciences)  . Smokeless tobacco: Never Used  Vaping Use  . Vaping Use: Never used  Substance and Sexual Activity  . Alcohol use: Yes    Alcohol/week: 2.0 standard drinks    Types: 2 Glasses of wine per week    Comment: a night  . Drug use: No  . Sexual activity: Not on file  Other Topics Concern  . Not on file  Social History Narrative  . Not on file   Social Determinants of Health   Financial Resource Strain:   . Difficulty of Paying Living Expenses: Not on file  Food Insecurity:   . Worried About Charity fundraiser in the Last Year: Not on file  . Ran Out of Food in  the Last Year: Not on file  Transportation Needs:   . Lack of Transportation (Medical): Not on file  . Lack of Transportation (Non-Medical): Not on file  Physical Activity:   . Days of Exercise per Week: Not on file  . Minutes of Exercise per Session: Not on file  Stress:   . Feeling of Stress : Not on file  Social Connections:   . Frequency of Communication with Friends and Family: Not on file  . Frequency of Social Gatherings with Friends and Family: Not on file  . Attends Religious Services: Not on file  . Active Member of Clubs or Organizations: Not on file  . Attends Archivist Meetings: Not on file  . Marital Status: Not on file  Intimate Partner Violence:   . Fear of Current or Ex-Partner: Not on file  . Emotionally Abused: Not on file  . Physically Abused: Not on file  . Sexually Abused: Not on file      Review of Systems  Constitutional: Negative for diaphoresis and fever.  HENT: Positive for rhinorrhea.   Eyes: Negative.   Respiratory: Positive for cough and shortness of breath.   Cardiovascular: Negative.  Negative for leg swelling.  Allergic/Immunologic: Positive for environmental allergies.    Vital Signs: BP (!) 113/51   Pulse 70   Temp 98 F (36.7 C)   Resp 16   Ht 5\' 8"  (1.727 m)   Wt 241 lb 6.4 oz (109.5 kg)   SpO2 95%   BMI 36.70 kg/m    Physical Exam Constitutional:      Appearance: He is obese.  HENT:     Head: Normocephalic and atraumatic.     Mouth/Throat:     Mouth: Mucous membranes are moist.  Eyes:     Extraocular Movements: Extraocular movements intact.     Pupils: Pupils are equal, round, and reactive to light.  Cardiovascular:     Rate and Rhythm: Normal rate and regular rhythm.     Pulses: Normal pulses.     Heart sounds: Normal heart sounds.  Pulmonary:     Breath sounds: Wheezing present.  Neurological:     General: No focal deficit present.     Mental Status: He is alert.     Assessment/Plan: 1. SOB  (shortness of breath) Pt is unable to afford Trelegy. Will continue on duoneb, might need pt assistance  - Spirometry with Graph   2. Numbness and tingling of both lower extremities Pt is a known diabetic, stocking and glove type of pattern. Will check labs, might need to be on gabapentin   - B12 and Folate Panel - Ferritin; Future  3. Allergic rhinitis due to  pollen, unspecified seasonality Continue immunotherapy   General Counseling: ruven corradi understanding of the findings of todays visit and agrees with plan of treatment. I have discussed any further diagnostic evaluation that may be needed or ordered today. We also reviewed his medications today. he has been encouraged to call the office with any questions or concerns that should arise related to todays visit.    Orders Placed This Encounter  Procedures  . B12 and Folate Panel  . Ferritin  . Spirometry with Graph    No orders of the defined types were placed in this encounter.   Total time spent: 25 Minutes Time spent includes review of chart, medications, test results, and follow up plan with the patient.      Dr Lavera Guise Internal medicine

## 2020-04-20 DIAGNOSIS — R0602 Shortness of breath: Secondary | ICD-10-CM | POA: Diagnosis not present

## 2020-04-20 DIAGNOSIS — Z20822 Contact with and (suspected) exposure to covid-19: Secondary | ICD-10-CM | POA: Diagnosis not present

## 2020-04-20 DIAGNOSIS — R6883 Chills (without fever): Secondary | ICD-10-CM | POA: Diagnosis not present

## 2020-04-21 ENCOUNTER — Ambulatory Visit: Payer: PPO

## 2020-04-22 DIAGNOSIS — B349 Viral infection, unspecified: Secondary | ICD-10-CM | POA: Diagnosis not present

## 2020-04-22 DIAGNOSIS — R252 Cramp and spasm: Secondary | ICD-10-CM | POA: Diagnosis not present

## 2020-04-28 ENCOUNTER — Other Ambulatory Visit: Payer: Self-pay

## 2020-04-28 ENCOUNTER — Ambulatory Visit: Payer: PPO

## 2020-04-28 DIAGNOSIS — J301 Allergic rhinitis due to pollen: Secondary | ICD-10-CM

## 2020-05-05 ENCOUNTER — Ambulatory Visit: Payer: PPO

## 2020-05-05 ENCOUNTER — Other Ambulatory Visit: Payer: Self-pay

## 2020-05-05 DIAGNOSIS — J301 Allergic rhinitis due to pollen: Secondary | ICD-10-CM | POA: Diagnosis not present

## 2020-05-06 DIAGNOSIS — G4733 Obstructive sleep apnea (adult) (pediatric): Secondary | ICD-10-CM | POA: Diagnosis not present

## 2020-05-13 DIAGNOSIS — J301 Allergic rhinitis due to pollen: Secondary | ICD-10-CM | POA: Diagnosis not present

## 2020-05-19 ENCOUNTER — Ambulatory Visit (INDEPENDENT_AMBULATORY_CARE_PROVIDER_SITE_OTHER): Payer: PPO

## 2020-05-19 DIAGNOSIS — J301 Allergic rhinitis due to pollen: Secondary | ICD-10-CM | POA: Diagnosis not present

## 2020-05-24 ENCOUNTER — Telehealth: Payer: Self-pay

## 2020-05-24 NOTE — Telephone Encounter (Signed)
Pt called that having trouble breathing I offer him appt he said he is going to call us back for appt advised him as per adam that used nebulizer  Every 6 hrs as needed and see how he is feeling and also if not feeling call us back or talk to his pcp on thursday

## 2020-05-27 DIAGNOSIS — Z1211 Encounter for screening for malignant neoplasm of colon: Secondary | ICD-10-CM | POA: Diagnosis not present

## 2020-05-27 DIAGNOSIS — R079 Chest pain, unspecified: Secondary | ICD-10-CM | POA: Diagnosis not present

## 2020-05-27 DIAGNOSIS — E1165 Type 2 diabetes mellitus with hyperglycemia: Secondary | ICD-10-CM | POA: Diagnosis not present

## 2020-05-27 DIAGNOSIS — Z23 Encounter for immunization: Secondary | ICD-10-CM | POA: Diagnosis not present

## 2020-05-27 DIAGNOSIS — Z0000 Encounter for general adult medical examination without abnormal findings: Secondary | ICD-10-CM | POA: Diagnosis not present

## 2020-05-27 DIAGNOSIS — Z125 Encounter for screening for malignant neoplasm of prostate: Secondary | ICD-10-CM | POA: Diagnosis not present

## 2020-05-27 DIAGNOSIS — E7800 Pure hypercholesterolemia, unspecified: Secondary | ICD-10-CM | POA: Diagnosis not present

## 2020-05-27 DIAGNOSIS — R0789 Other chest pain: Secondary | ICD-10-CM | POA: Diagnosis not present

## 2020-05-27 DIAGNOSIS — M47814 Spondylosis without myelopathy or radiculopathy, thoracic region: Secondary | ICD-10-CM | POA: Diagnosis not present

## 2020-05-27 DIAGNOSIS — J4540 Moderate persistent asthma, uncomplicated: Secondary | ICD-10-CM | POA: Diagnosis not present

## 2020-05-27 DIAGNOSIS — I10 Essential (primary) hypertension: Secondary | ICD-10-CM | POA: Diagnosis not present

## 2020-05-27 DIAGNOSIS — Z6836 Body mass index (BMI) 36.0-36.9, adult: Secondary | ICD-10-CM | POA: Diagnosis not present

## 2020-05-31 ENCOUNTER — Other Ambulatory Visit: Payer: Self-pay | Admitting: Internal Medicine

## 2020-05-31 DIAGNOSIS — R200 Anesthesia of skin: Secondary | ICD-10-CM

## 2020-05-31 DIAGNOSIS — R202 Paresthesia of skin: Secondary | ICD-10-CM

## 2020-05-31 DIAGNOSIS — R0602 Shortness of breath: Secondary | ICD-10-CM

## 2020-06-02 ENCOUNTER — Ambulatory Visit: Payer: PPO

## 2020-06-02 ENCOUNTER — Other Ambulatory Visit: Payer: Self-pay

## 2020-06-02 DIAGNOSIS — J301 Allergic rhinitis due to pollen: Secondary | ICD-10-CM

## 2020-06-05 DIAGNOSIS — G4733 Obstructive sleep apnea (adult) (pediatric): Secondary | ICD-10-CM | POA: Diagnosis not present

## 2020-06-09 ENCOUNTER — Telehealth: Payer: Self-pay

## 2020-06-09 NOTE — Telephone Encounter (Signed)
As per taylor gave pt trelegy

## 2020-06-09 NOTE — Telephone Encounter (Signed)
Gave samples for trelegy

## 2020-06-12 DIAGNOSIS — Z1211 Encounter for screening for malignant neoplasm of colon: Secondary | ICD-10-CM | POA: Diagnosis not present

## 2020-06-14 DIAGNOSIS — R509 Fever, unspecified: Secondary | ICD-10-CM | POA: Diagnosis not present

## 2020-06-14 DIAGNOSIS — Z20822 Contact with and (suspected) exposure to covid-19: Secondary | ICD-10-CM | POA: Diagnosis not present

## 2020-06-14 DIAGNOSIS — R6883 Chills (without fever): Secondary | ICD-10-CM | POA: Diagnosis not present

## 2020-06-16 ENCOUNTER — Ambulatory Visit: Payer: PPO

## 2020-06-16 DIAGNOSIS — J209 Acute bronchitis, unspecified: Secondary | ICD-10-CM | POA: Diagnosis not present

## 2020-06-16 DIAGNOSIS — J440 Chronic obstructive pulmonary disease with acute lower respiratory infection: Secondary | ICD-10-CM | POA: Diagnosis not present

## 2020-06-16 DIAGNOSIS — I10 Essential (primary) hypertension: Secondary | ICD-10-CM | POA: Diagnosis not present

## 2020-06-16 DIAGNOSIS — G4733 Obstructive sleep apnea (adult) (pediatric): Secondary | ICD-10-CM | POA: Diagnosis not present

## 2020-06-25 LAB — EXTERNAL GENERIC LAB PROCEDURE: COLOGUARD: NEGATIVE

## 2020-06-25 LAB — COLOGUARD: COLOGUARD: NEGATIVE

## 2020-06-30 ENCOUNTER — Ambulatory Visit: Payer: PPO

## 2020-06-30 ENCOUNTER — Other Ambulatory Visit: Payer: Self-pay

## 2020-06-30 DIAGNOSIS — J301 Allergic rhinitis due to pollen: Secondary | ICD-10-CM

## 2020-07-02 ENCOUNTER — Other Ambulatory Visit: Payer: Self-pay

## 2020-07-02 ENCOUNTER — Encounter: Payer: Self-pay | Admitting: Emergency Medicine

## 2020-07-02 ENCOUNTER — Emergency Department
Admission: EM | Admit: 2020-07-02 | Discharge: 2020-07-02 | Disposition: A | Discharge status: Home / Self Care | Payer: PPO | Attending: Student in an Organized Health Care Education/Training Program | Admitting: Student in an Organized Health Care Education/Training Program

## 2020-07-02 DIAGNOSIS — Z203 Contact with and (suspected) exposure to rabies: Secondary | ICD-10-CM | POA: Diagnosis not present

## 2020-07-02 DIAGNOSIS — Z23 Encounter for immunization: Secondary | ICD-10-CM | POA: Diagnosis not present

## 2020-07-02 DIAGNOSIS — S61451A Open bite of right hand, initial encounter: Secondary | ICD-10-CM

## 2020-07-02 DIAGNOSIS — Z2914 Encounter for prophylactic rabies immune globin: Secondary | ICD-10-CM | POA: Diagnosis not present

## 2020-07-02 DIAGNOSIS — W5501XA Bitten by cat, initial encounter: Secondary | ICD-10-CM | POA: Insufficient documentation

## 2020-07-02 MED ORDER — TETANUS-DIPHTH-ACELL PERTUSSIS 5-2.5-18.5 LF-MCG/0.5 IM SUSY
0.5000 mL | PREFILLED_SYRINGE | Freq: Once | INTRAMUSCULAR | Status: AC
  Administered 2020-07-02: 0.5 mL via INTRAMUSCULAR
  Filled 2020-07-02: qty 0.5

## 2020-07-02 MED ORDER — RABIES IMMUNE GLOBULIN 300 UNIT/2ML IJ SOLN
600.0000 [IU] | Freq: Once | INTRAMUSCULAR | Status: AC
  Administered 2020-07-02: 600 [IU] via INTRAMUSCULAR
  Filled 2020-07-02: qty 4

## 2020-07-02 MED ORDER — AMOXICILLIN-POT CLAVULANATE 875-125 MG PO TABS: 1.0000 | ORAL_TABLET | Freq: Two times a day (BID) | ORAL | 0 refills | Status: AC

## 2020-07-02 MED ORDER — RABIES IMMUNE GLOBULIN 150 UNIT/ML IM INJ: 20.0000 [IU]/kg | INJECTION | Freq: Once | INTRAMUSCULAR | Status: DC

## 2020-07-02 MED ORDER — RABIES VACCINE, PCEC IM SUSR: 1.0000 mL | Freq: Once | INTRAMUSCULAR | Status: DC

## 2020-07-02 MED ORDER — HYDROCODONE-ACETAMINOPHEN 5-325 MG PO TABS
1.0000 | ORAL_TABLET | Freq: Four times a day (QID) | ORAL | 0 refills | Status: DC | PRN
Start: 2020-07-02 — End: 2021-02-01

## 2020-07-02 MED ORDER — RABIES IMMUNE GLOBULIN 1500 UNIT/10ML IJ SOLN
1500.0000 [IU] | Freq: Once | INTRAMUSCULAR | Status: AC
  Administered 2020-07-02: 1500 [IU] via INTRAMUSCULAR
  Filled 2020-07-02: qty 10

## 2020-07-02 MED ORDER — RABIES VACCINE, PCEC IM SUSR
1.0000 mL | Freq: Once | INTRAMUSCULAR | Status: AC
  Administered 2020-07-02: 1 mL via INTRAMUSCULAR
  Filled 2020-07-02: qty 1

## 2020-07-02 NOTE — Discharge Instructions (Addendum)
You will still need to talk to animal control and have them come out and take the cat to be observed.  A tetanus was given to you today and should be good for up to 10 years unless you have a dirty wound.  Begin taking antibiotics until completely finished.  Clean the hand daily with mild soap and water and watch for any signs of infection.  Rabies prophylaxis was started today.  You will also need to get remaining following days.  [facility] is listed on your discharge papers as you stated that you live in [place].  The address and phone number is listed on your discharge papers. 11-22 11-26 12-3

## 2020-07-02 NOTE — ED Notes (Signed)
Animal control called and message was left.

## 2020-07-02 NOTE — ED Provider Notes (Signed)
[facility] Emergency Department Provider Note   ____________________________________________   First MD Initiated Contact with Patient 07/02/20 1102     (approximate)  I have reviewed the triage vital signs and the nursing notes.   HISTORY  Chief Complaint Animal Bite   HPI Charles Mata is a 70 y.o. male presents to the ED with concerns of his right hand after being bit by a stray cat.  Patient states that this is a feral cat that comes around often and eats food with several other cats on his deck each day.  Patient recently like a cat come inside his house but yesterday was bitten.  Patient has seen cat again today.  He states that "like clockwork the cat appears".  Patient is unsure of his last tetanus.  At this time he has not notified [facility].  Nursing staff in the ED has called and left a message with animal control.  Patient currently rates his pain a 0 out of 10.  Patient is diabetic and states that the highest his glucose runs is 160 and this is after 2 classes of wine.  He prides himself in having good control of his diabetes.      Past Medical History:  Diagnosis Date  . Asthma   . Diabetes ([place])   . Hyperlipidemia   . Hypertension   . Sickle cell trait ([place])   . Sleep apnea     Patient Active Problem List   Diagnosis Date Noted  . Benign paroxysmal positional vertigo 11/30/2016  . Chronic midline low back pain without sciatica 10/07/2015  . Type 2 diabetes mellitus, controlled ([place]) 11/26/2014  . COPD, mild ([place]) 01/29/2014  . Sleep apnea 01/29/2014  . Hyperlipemia 01/16/2014  . Hypertension 01/16/2014    Past Surgical History:  Procedure Laterality Date  . COLONOSCOPY    . heart catheriation      Prior to Admission medications   Medication Sig Start Date End Date Taking? Authorizing Provider  albuterol (PROVENTIL) (2.5 MG/3ML) 0.083% nebulizer solution Take 3 mLs (2.5 mg total) by nebulization every  6 (six) hours as needed for wheezing or shortness of breath. 11/06/19   Kendell Bane, NP  amoxicillin-clavulanate (AUGMENTIN) 875-125 MG tablet Take 1 tablet by mouth 2 (two) times daily for 7 days. 07/02/20 07/09/20  Johnn Hai, PA-C  aspirin EC 81 MG tablet Take by mouth.    [provider]  [employer]S VITAMIN B12 1000 MCG tablet TAKE 1 TABLET (1,000 MCG TOTAL) BY MOUTH ONCE DAILY. 09/15/17   [provider]  EPINEPHrine (EPIPEN 2-PAK) 0.3 mg/0.3 mL IJ SOAJ injection Inject 0.3 mLs (0.3 mg total) into the muscle as needed for anaphylaxis. 11/06/19   Kendell Bane, NP  furosemide (LASIX) 40 MG tablet TAKE 1 TABLET (40 MG TOTAL) BY MOUTH ONCE DAILY. 10/05/17   [provider]  glucose blood (CONTOUR NEXT TEST) test strip  11/24/16   [provider]  glyBURIDE (DIABETA) 5 MG tablet TAKE 1 TABLET BY MOUTH EVERY DAY WITH BREAKFAST 10/22/17   [provider]  HYDROcodone-acetaminophen (NORCO/VICODIN) 5-325 MG tablet Take 1 tablet by mouth every 6 (six) hours as needed for moderate pain. 07/02/20   Johnn Hai, PA-C  Ipratropium-Albuterol (COMBIVENT RESPIMAT) 20-100 MCG/ACT AERS respimat INHALE 1 PUFF BY MOUTH 4 TIMES A DAY AS NEEDED 01/29/16   [provider]  ipratropium-albuterol (DUONEB) 0.5-2.5 (3) MG/3ML SOLN Take 3 mLs by nebulization every 4 (four) hours  as needed. 11/26/18   Kendell Bane, NP  linaclotide (LINZESS) 72 MCG capsule Take 72 mcg by mouth daily before breakfast.    [provider]  lisinopril (PRINIVIL,ZESTRIL) 5 MG tablet Take by mouth. 05/21/17   [provider]  Misc. Devices MISC by Does not apply route. cpap    [provider]  montelukast (SINGULAIR) 10 MG tablet TAKE 1 TABLET BY MOUTH EVERY DAY 02/23/20   Lavera Guise, MD  omeprazole (PRILOSEC) 40 MG capsule TAKE 1 CAPSULE BY MOUTH EVERY DAY 08/08/17   [provider]  oxymetazoline (NASAL RELIEF) 0.05 % nasal spray Place into the  nose.    [provider]  rosuvastatin (CRESTOR) 10 MG tablet Take 5 mg by mouth daily. 09/16/17   [provider]  tamsulosin (FLOMAX) 0.4 MG CAPS capsule TAKE ONE CAPSULE BY MOUTH EVERY DAY 1/2 HOUR AFTER A MEAL 09/17/17   [provider]  Vitamin D, Ergocalciferol, (DRISDOL) 50000 units CAPS capsule TAKE ONE CAPSULE BY MOUTH ONE TIME PER WEEK 08/02/17   [provider]    Allergies Aspirin and Meloxicam  History reviewed. No pertinent family history.  Social History Social History   Tobacco Use  . Smoking status: Former Research scientist (life sciences)  . Smokeless tobacco: Never Used  Vaping Use  . Vaping Use: Never used  Substance Use Topics  . Alcohol use: Yes    Alcohol/week: 2.0 standard drinks    Types: 2 Glasses of wine per week    Comment: a night  . Drug use: No    Review of Systems Constitutional: No fever/chills Eyes: No visual changes. ENT: No trauma. Cardiovascular: Denies chest pain. Respiratory: Denies shortness of breath. Gastrointestinal: No abdominal pain.  No nausea, no vomiting.   Musculoskeletal: Positive for cat bite right hand. Skin: Positive puncture wound right hand. Neurological: Negative for headaches, focal weakness or numbness. Endocrine:  Positive for diabetes mellitus ____________________________________________   PHYSICAL EXAM:  VITAL SIGNS: ED Triage Vitals  Enc Vitals Group     BP 07/02/20 0935 (!) 128/46     Pulse Rate 07/02/20 0935 88     Resp 07/02/20 0935 19     Temp 07/02/20 0935 98.5 F (36.9 C)     Temp Source 07/02/20 0935 Oral     SpO2 07/02/20 0935 100 %     Weight 07/02/20 0932 235 lb (106.6 kg)     Height 07/02/20 0932 5\' 8"  (1.727 m)     Head Circumference --      Peak Flow --      Pain Score 07/02/20 0931 0     Pain Loc --      Pain Edu? --      Excl. in [place]? --     Constitutional: Alert and oriented. Well appearing and in no acute distress. Eyes: Conjunctivae are normal. PERRL. EOMI. Head:  Atraumatic. Neck: No stridor.   Cardiovascular: Normal rate, regular rhythm. Grossly normal heart sounds.  Good peripheral circulation. Respiratory: Normal respiratory effort.  No retractions. Lungs CTAB. Musculoskeletal: On examination of the right hand there is to puncture wounds from a stray cat.  Patient is able to move all digits without any difficulty.  Motor sensory function intact.  Capillary refill is less than 3 seconds.  Radial pulse is present. Neurologic:  Normal speech and language. No gross focal neurologic deficits are appreciated. No gait instability. Skin:  Skin is warm, dry.  Puncture wound x2 to the right hand. Psychiatric: Mood and affect  are normal. Speech and behavior are normal.  ____________________________________________   LABS (all labs ordered are listed, but only abnormal results are displayed)  Labs Reviewed - No data to display ____________________________________________  PROCEDURES  Procedure(s) performed (including Critical Care):  Procedures   ____________________________________________   INITIAL IMPRESSION / ASSESSMENT AND PLAN / ED COURSE  As part of my medical decision making, I reviewed the following data within the electronic MEDICAL RECORD NUMBER Notes from prior ED visits and [place] Controlled Substance Database  History 3-year-old male presents to the ED after being bitten by a stray cat that he has been feeding for several nights.  Patient states that he also has seen the cat this morning and would be able to pick it out among other stray cats that he has been feeding.  Patient has not reported this to animal control at this time.  We discussed the pros and cons of getting the rabies prophylaxis and initially patient was comfortable with waiting for animal control to pick up the cat.  However while his hand was soaking and a dressing was being applied he decided that he would rather get the rabies vaccine in case the animal control was unable to get  the cat.  He is aware of the multiple times that he would need to get the RabAvert.  Patient was discharged with instructions to watch the area closely for any signs of infection and clean daily with mild soap and water.  He was discharged with prescription for Augmentin 875 twice daily and Norco if needed for pain.  He is aware that he should return to the emergency department if any infection is seen due to his diabetes.    ____________________________________________   FINAL CLINICAL IMPRESSION(S) / ED DIAGNOSES  Final diagnoses:  Cat bite of right hand, initial encounter     ED Discharge Orders         Ordered    amoxicillin-clavulanate (AUGMENTIN) 875-125 MG tablet  2 times daily        07/02/20 1503    HYDROcodone-acetaminophen (NORCO/VICODIN) 5-325 MG tablet  Every 6 hours PRN        07/02/20 1503          *Please note:  Charles Mata was evaluated in Emergency Department on 07/02/2020 for the symptoms described in the history of present illness. He was evaluated in the context of the global COVID-19 pandemic, which necessitated consideration that the patient might be at risk for infection with the SARS-CoV-2 virus that causes COVID-19. Institutional protocols and algorithms that pertain to the evaluation of patients at risk for COVID-19 are in a state of rapid change based on information released by regulatory bodies including the CDC and federal and state organizations. These policies and algorithms were followed during the patient's care in the ED.  Some ED evaluations and interventions may be delayed as a result of limited staffing during and the pandemic.*   Note:  This document was prepared using Dragon voice recognition software and may include unintentional dictation errors.    Johnn Hai, PA-C 07/02/20 1637    Merlyn Lot, MD 07/06/20 [phone]

## 2020-07-02 NOTE — ED Notes (Addendum)
Patient spoke with animal control via telephone.

## 2020-07-02 NOTE — ED Notes (Signed)
E-signature pad not functional. 

## 2020-07-02 NOTE — ED Triage Notes (Signed)
Pt to ED from [facility]. Pt states bit by a feral cat yesterday, unknown vaccination status at this time of cat. A&O x4, ambulatory without difficulty.

## 2020-07-02 NOTE — ED Notes (Signed)
Pt reports was bit by a stray cat on his right hand, base of thumb and palm. Pt reports cat came around and he let it stay because it was helpful but then it bit him. Pt was referred to get the rabies vaccine.

## 2020-07-05 ENCOUNTER — Ambulatory Visit
Admission: EM | Admit: 2020-07-05 | Discharge: 2020-07-05 | Disposition: A | Discharge status: Home / Self Care | Payer: PPO | Attending: Family Medicine | Admitting: Family Medicine

## 2020-07-05 ENCOUNTER — Encounter: Payer: Self-pay | Admitting: Emergency Medicine

## 2020-07-05 DIAGNOSIS — Z23 Encounter for immunization: Secondary | ICD-10-CM | POA: Diagnosis not present

## 2020-07-05 MED ORDER — RABIES VACCINE, PCEC IM SUSR
1.0000 mL | Freq: Once | INTRAMUSCULAR | Status: AC
  Administered 2020-07-05: 1 mL via INTRAMUSCULAR

## 2020-07-05 NOTE — ED Triage Notes (Signed)
Patient is receiving the 2nd Rabies injection.   Patient has no complaints today.

## 2020-07-06 DIAGNOSIS — G4733 Obstructive sleep apnea (adult) (pediatric): Secondary | ICD-10-CM | POA: Diagnosis not present

## 2020-07-09 ENCOUNTER — Encounter: Payer: Self-pay | Admitting: Emergency Medicine

## 2020-07-09 ENCOUNTER — Ambulatory Visit
Admission: EM | Admit: 2020-07-09 | Discharge: 2020-07-09 | Disposition: A | Discharge status: Home / Self Care | Payer: PPO | Attending: Emergency Medicine | Admitting: Emergency Medicine

## 2020-07-09 ENCOUNTER — Other Ambulatory Visit: Payer: Self-pay

## 2020-07-09 DIAGNOSIS — Z203 Contact with and (suspected) exposure to rabies: Secondary | ICD-10-CM | POA: Diagnosis not present

## 2020-07-09 MED ORDER — RABIES VACCINE, PCEC IM SUSR
1.0000 mL | Freq: Once | INTRAMUSCULAR | Status: AC
  Administered 2020-07-09: 1 mL via INTRAMUSCULAR

## 2020-07-09 NOTE — ED Triage Notes (Signed)
Pt presents for 3rd rabies injection. 

## 2020-07-15 ENCOUNTER — Ambulatory Visit: Payer: PPO | Admitting: Internal Medicine

## 2020-07-15 ENCOUNTER — Other Ambulatory Visit: Payer: Self-pay

## 2020-07-15 ENCOUNTER — Encounter: Payer: Self-pay | Admitting: Internal Medicine

## 2020-07-15 VITALS — BP 113/80 | HR 95 | Temp 98.5°F | Resp 16 | Ht 70.0 in | Wt 234.6 lb

## 2020-07-15 DIAGNOSIS — G4733 Obstructive sleep apnea (adult) (pediatric): Secondary | ICD-10-CM

## 2020-07-15 DIAGNOSIS — J4520 Mild intermittent asthma, uncomplicated: Secondary | ICD-10-CM

## 2020-07-15 DIAGNOSIS — R0602 Shortness of breath: Secondary | ICD-10-CM

## 2020-07-15 DIAGNOSIS — J301 Allergic rhinitis due to pollen: Secondary | ICD-10-CM

## 2020-07-15 NOTE — Patient Instructions (Signed)

## 2020-07-15 NOTE — Progress Notes (Signed)
[facility] [place], [place] [address]  Pulmonary Sleep Medicine   Office Visit Note  Patient Name: Charles Mata DOB: 1950-09-28 MRN [id number]  Date of Service: 07/15/2020  Complaints/HPI: OSA has excellent compliance noted. His last download was 100% Recently has been having issues with a respiratory infection and states he has been having more difficulty with using the mask. Patient states he has tried to use again without much effect. He states he is using highest humidity setting. I have asked him to turn it down this may be causing excess mucus? He states he is not smoking but he does still drink. He states he is not drinking hard liquor and only drinks wine. Denies having any headaches or sinus congestion  ROS  General: (-) fever, (-) chills, (-) night sweats, (-) weakness Skin: (-) rashes, (-) itching,. Eyes: (-) visual changes, (-) redness, (-) itching. Nose and Sinuses: (-) nasal stuffiness or itchiness, (-) postnasal drip, (-) nosebleeds, (-) sinus trouble. Mouth and Throat: (-) sore throat, (-) hoarseness. Neck: (-) swollen glands, (-) enlarged thyroid, (-) neck pain. Respiratory: + cough, (-) bloody sputum, - shortness of breath, - wheezing. Cardiovascular: - ankle swelling, (-) chest pain. Lymphatic: (-) lymph node enlargement. Neurologic: (-) numbness, (-) tingling. Psychiatric: (-) anxiety, (-) depression   Current Medication: Outpatient Encounter Medications as of 07/15/2020  Medication Sig  . albuterol (PROVENTIL) (2.5 MG/3ML) 0.083% nebulizer solution Take 3 mLs (2.5 mg total) by nebulization every 6 (six) hours as needed for wheezing or shortness of breath.  Marland Kitchen aspirin EC 81 MG tablet Take by mouth.  . CVS VITAMIN B12 1000 MCG tablet TAKE 1 TABLET (1,000 MCG TOTAL) BY MOUTH ONCE DAILY.  Marland Kitchen EPINEPHrine (EPIPEN 2-PAK) 0.3 mg/0.3 mL IJ SOAJ injection Inject 0.3 mLs (0.3 mg total) into the muscle as needed for anaphylaxis.  . furosemide  (LASIX) 40 MG tablet TAKE 1 TABLET (40 MG TOTAL) BY MOUTH ONCE DAILY.  Marland Kitchen glucose blood (CONTOUR NEXT TEST) test strip   . glyBURIDE (DIABETA) 5 MG tablet TAKE 1 TABLET BY MOUTH EVERY DAY WITH BREAKFAST  . HYDROcodone-acetaminophen (NORCO/VICODIN) 5-325 MG tablet Take 1 tablet by mouth every 6 (six) hours as needed for moderate pain.  . Ipratropium-Albuterol (COMBIVENT RESPIMAT) 20-100 MCG/ACT AERS respimat INHALE 1 PUFF BY MOUTH 4 TIMES A DAY AS NEEDED  . ipratropium-albuterol (DUONEB) 0.5-2.5 (3) MG/3ML SOLN Take 3 mLs by nebulization every 4 (four) hours as needed.  . linaclotide (LINZESS) 72 MCG capsule Take 72 mcg by mouth daily before breakfast.  . lisinopril (PRINIVIL,ZESTRIL) 5 MG tablet Take by mouth.  . Misc. Devices MISC by Does not apply route. cpap  . montelukast (SINGULAIR) 10 MG tablet TAKE 1 TABLET BY MOUTH EVERY DAY  . omeprazole (PRILOSEC) 40 MG capsule TAKE 1 CAPSULE BY MOUTH EVERY DAY  . oxymetazoline (NASAL RELIEF) 0.05 % nasal spray Place into the nose.  . rosuvastatin (CRESTOR) 10 MG tablet Take 5 mg by mouth daily.  . tamsulosin (FLOMAX) 0.4 MG CAPS capsule TAKE ONE CAPSULE BY MOUTH EVERY DAY 1/2 HOUR AFTER A MEAL  . Vitamin D, Ergocalciferol, (DRISDOL) 50000 units CAPS capsule TAKE ONE CAPSULE BY MOUTH ONE TIME PER WEEK   No facility-administered encounter medications on file as of 07/15/2020.    Surgical History: Past Surgical History:  Procedure Laterality Date  . COLONOSCOPY    . heart catheriation      Medical History: Past Medical History:  Diagnosis Date  . Asthma   .  Diabetes ([place])   . Hyperlipidemia   . Hypertension   . Sickle cell trait ([place])   . Sleep apnea     Family History: History reviewed. No pertinent family history.  Social History: Social History   Socioeconomic History  . Marital status: Widowed    Spouse name: Not on file  . Number of children: Not on file  . Years of education: Not on file  . Highest education level: Not on  file  Occupational History  . Not on file  Tobacco Use  . Smoking status: Former Research scientist (life sciences)  . Smokeless tobacco: Never Used  Vaping Use  . Vaping Use: Never used  Substance and Sexual Activity  . Alcohol use: Yes    Alcohol/week: 2.0 standard drinks    Types: 2 Glasses of wine per week    Comment: a night  . Drug use: No  . Sexual activity: Not on file  Other Topics Concern  . Not on file  Social History Narrative  . Not on file   Social Determinants of Health   Financial Resource Strain:   . Difficulty of Paying Living Expenses: Not on file  Food Insecurity:   . Worried About Charity fundraiser in the Last Year: Not on file  . Ran Out of Food in the Last Year: Not on file  Transportation Needs:   . Lack of Transportation (Medical): Not on file  . Lack of Transportation (Non-Medical): Not on file  Physical Activity:   . Days of Exercise per Week: Not on file  . Minutes of Exercise per Session: Not on file  Stress:   . Feeling of Stress : Not on file  Social Connections:   . Frequency of Communication with Friends and Family: Not on file  . Frequency of Social Gatherings with Friends and Family: Not on file  . Attends Religious Services: Not on file  . Active Member of Clubs or Organizations: Not on file  . Attends Archivist Meetings: Not on file  . Marital Status: Not on file  Intimate Partner Violence:   . Fear of Current or Ex-Partner: Not on file  . Emotionally Abused: Not on file  . Physically Abused: Not on file  . Sexually Abused: Not on file    Vital Signs: Blood pressure 113/80, pulse 95, temperature 98.5 F (36.9 C), resp. rate 16, height 5\' 10"  (1.778 m), weight 234 lb 9.6 oz (106.4 kg), SpO2 97 %.  Examination: General Appearance: The patient is well-developed, well-nourished, and in no distress. Skin: Gross inspection of skin unremarkable. Head: normocephalic, no gross deformities. Eyes: no gross deformities noted. ENT: ears appear  grossly normal no exudates. Neck: Supple. No thyromegaly. No LAD. Respiratory: no rhonchi noted at this time. Cardiovascular: Normal S1 and S2 without murmur or rub. Extremities: No cyanosis. pulses are equal. Neurologic: Alert and oriented. No involuntary movements.  LABS: No results found for this or any previous visit (from the past 2160 hour(s)).  Radiology: No results found.  No results found.  No results found.    Assessment and Plan: Patient Active Problem List   Diagnosis Date Noted  . Benign paroxysmal positional vertigo 11/30/2016  . Chronic midline low back pain without sciatica 10/07/2015  . Type 2 diabetes mellitus, controlled ([place]) 11/26/2014  . COPD, mild ([place]) 01/29/2014  . Sleep apnea 01/29/2014  . Hyperlipemia 01/16/2014  . Hypertension 01/16/2014    1. OSA encourage compliance with his current PAP therapy. He will turn down the  humidity as discussed above and see if this helps 2. Mild Asthma under control. He has not needed inhalers at this time 3. Allergic rhinitis controlled right now 4. Obesity needs to work on diet and exercise weight loss  Obesity Counseling: Risk Assessment: An assessment of behavioral risk factors was made today and includes lack of exercise sedentary lifestyle, lack of portion control and poor dietary habits.  Risk Modification Advice: She was counseled on portion control guidelines. Restricting daily caloric intake to 1800 . The detrimental long term effects of obesity on her health and ongoing poor compliance was also discussed with the patient.    General Counseling: I have discussed the findings of the evaluation and examination with Annie Main.  I have also discussed any further diagnostic evaluation thatmay be needed or ordered today. Paden verbalizes understanding of the findings of todays visit. We also reviewed his medications today and discussed drug interactions and side effects including but not limited excessive  drowsiness and altered mental states. We also discussed that there is always a risk not just to him but also people around him. he has been encouraged to call the office with any questions or concerns that should arise related to todays visit.  Orders Placed This Encounter  Procedures  . Spirometry with Graph    Order Specific Question:   Where should this test be performed?    Answer:   Other     Time spent: 44  I have personally obtained a history, examined the patient, evaluated laboratory and imaging results, formulated the assessment and plan and placed orders.    [name], MD [facility] Pulmonary and Critical Care Sleep medicine

## 2020-07-16 ENCOUNTER — Encounter: Payer: Self-pay | Admitting: Emergency Medicine

## 2020-07-16 ENCOUNTER — Ambulatory Visit
Admission: EM | Admit: 2020-07-16 | Discharge: 2020-07-16 | Disposition: A | Discharge status: Home / Self Care | Payer: PPO | Attending: Emergency Medicine | Admitting: Emergency Medicine

## 2020-07-16 ENCOUNTER — Other Ambulatory Visit: Payer: Self-pay

## 2020-07-16 DIAGNOSIS — Z23 Encounter for immunization: Secondary | ICD-10-CM

## 2020-07-16 MED ORDER — RABIES VACCINE, PCEC IM SUSR
1.0000 mL | Freq: Once | INTRAMUSCULAR | Status: AC
  Administered 2020-07-16: 1 mL via INTRAMUSCULAR

## 2020-07-16 NOTE — ED Triage Notes (Signed)
Patient in office for last Rabies Injection

## 2020-07-26 ENCOUNTER — Ambulatory Visit: Payer: PPO

## 2020-07-26 ENCOUNTER — Other Ambulatory Visit: Payer: Self-pay

## 2020-07-26 DIAGNOSIS — J301 Allergic rhinitis due to pollen: Secondary | ICD-10-CM | POA: Diagnosis not present

## 2020-07-28 ENCOUNTER — Ambulatory Visit: Payer: PPO

## 2020-08-04 ENCOUNTER — Ambulatory Visit: Payer: PPO

## 2020-08-10 DIAGNOSIS — J301 Allergic rhinitis due to pollen: Secondary | ICD-10-CM

## 2020-08-11 ENCOUNTER — Other Ambulatory Visit: Payer: Self-pay

## 2020-08-11 ENCOUNTER — Ambulatory Visit: Payer: PPO

## 2020-08-11 DIAGNOSIS — J301 Allergic rhinitis due to pollen: Secondary | ICD-10-CM

## 2020-08-25 ENCOUNTER — Other Ambulatory Visit: Payer: Self-pay

## 2020-08-25 ENCOUNTER — Ambulatory Visit: Payer: PPO

## 2020-08-25 DIAGNOSIS — J301 Allergic rhinitis due to pollen: Secondary | ICD-10-CM | POA: Diagnosis not present

## 2020-09-08 ENCOUNTER — Ambulatory Visit (INDEPENDENT_AMBULATORY_CARE_PROVIDER_SITE_OTHER): Payer: PPO

## 2020-09-08 ENCOUNTER — Ambulatory Visit: Payer: PPO

## 2020-09-08 DIAGNOSIS — G4733 Obstructive sleep apnea (adult) (pediatric): Secondary | ICD-10-CM

## 2020-09-08 DIAGNOSIS — J301 Allergic rhinitis due to pollen: Secondary | ICD-10-CM | POA: Diagnosis not present

## 2020-09-08 NOTE — Progress Notes (Unsigned)
95 percentile pressure 9   95th percentile leak 93   apnea index 0.7 /hr  apnea-hypopnea index  1.0 /hr   total days used  >4 hr 28 days  total days used <4 hr 2 days  Total compliance 93 percent  He is doing great no problems or questions at this time.

## 2020-09-22 ENCOUNTER — Ambulatory Visit: Payer: PPO

## 2020-09-22 DIAGNOSIS — Z20822 Contact with and (suspected) exposure to covid-19: Secondary | ICD-10-CM | POA: Diagnosis not present

## 2020-09-22 DIAGNOSIS — Z6836 Body mass index (BMI) 36.0-36.9, adult: Secondary | ICD-10-CM | POA: Diagnosis not present

## 2020-09-22 DIAGNOSIS — E1165 Type 2 diabetes mellitus with hyperglycemia: Secondary | ICD-10-CM | POA: Diagnosis not present

## 2020-09-22 DIAGNOSIS — G4733 Obstructive sleep apnea (adult) (pediatric): Secondary | ICD-10-CM | POA: Diagnosis not present

## 2020-09-22 DIAGNOSIS — J45909 Unspecified asthma, uncomplicated: Secondary | ICD-10-CM | POA: Diagnosis not present

## 2020-09-22 DIAGNOSIS — J45901 Unspecified asthma with (acute) exacerbation: Secondary | ICD-10-CM | POA: Diagnosis not present

## 2020-09-22 DIAGNOSIS — R0602 Shortness of breath: Secondary | ICD-10-CM | POA: Diagnosis not present

## 2020-10-04 ENCOUNTER — Other Ambulatory Visit: Payer: Self-pay

## 2020-10-04 ENCOUNTER — Ambulatory Visit (INDEPENDENT_AMBULATORY_CARE_PROVIDER_SITE_OTHER): Payer: PPO

## 2020-10-04 DIAGNOSIS — J301 Allergic rhinitis due to pollen: Secondary | ICD-10-CM

## 2020-10-06 ENCOUNTER — Ambulatory Visit: Payer: PPO

## 2020-10-07 ENCOUNTER — Other Ambulatory Visit: Payer: Self-pay | Admitting: Internal Medicine

## 2020-10-07 DIAGNOSIS — R0609 Other forms of dyspnea: Secondary | ICD-10-CM

## 2020-10-07 DIAGNOSIS — R0789 Other chest pain: Secondary | ICD-10-CM | POA: Diagnosis not present

## 2020-10-07 DIAGNOSIS — R0600 Dyspnea, unspecified: Secondary | ICD-10-CM

## 2020-10-07 DIAGNOSIS — I10 Essential (primary) hypertension: Secondary | ICD-10-CM | POA: Diagnosis not present

## 2020-10-07 DIAGNOSIS — J209 Acute bronchitis, unspecified: Secondary | ICD-10-CM | POA: Diagnosis not present

## 2020-10-07 DIAGNOSIS — Z0000 Encounter for general adult medical examination without abnormal findings: Secondary | ICD-10-CM | POA: Diagnosis not present

## 2020-10-07 DIAGNOSIS — J440 Chronic obstructive pulmonary disease with acute lower respiratory infection: Secondary | ICD-10-CM | POA: Diagnosis not present

## 2020-10-07 DIAGNOSIS — K219 Gastro-esophageal reflux disease without esophagitis: Secondary | ICD-10-CM | POA: Diagnosis not present

## 2020-10-07 DIAGNOSIS — K449 Diaphragmatic hernia without obstruction or gangrene: Secondary | ICD-10-CM | POA: Diagnosis not present

## 2020-10-07 DIAGNOSIS — E1165 Type 2 diabetes mellitus with hyperglycemia: Secondary | ICD-10-CM

## 2020-10-11 DIAGNOSIS — E1165 Type 2 diabetes mellitus with hyperglycemia: Secondary | ICD-10-CM | POA: Diagnosis not present

## 2020-10-11 DIAGNOSIS — J440 Chronic obstructive pulmonary disease with acute lower respiratory infection: Secondary | ICD-10-CM | POA: Diagnosis not present

## 2020-10-11 DIAGNOSIS — J209 Acute bronchitis, unspecified: Secondary | ICD-10-CM | POA: Diagnosis not present

## 2020-10-11 DIAGNOSIS — R0600 Dyspnea, unspecified: Secondary | ICD-10-CM | POA: Diagnosis not present

## 2020-10-19 ENCOUNTER — Ambulatory Visit
Admission: RE | Admit: 2020-10-19 | Discharge: 2020-10-19 | Disposition: A | Discharge status: Home / Self Care | Payer: PPO | Source: Ambulatory Visit | Attending: Internal Medicine | Admitting: Internal Medicine

## 2020-10-19 ENCOUNTER — Ambulatory Visit (INDEPENDENT_AMBULATORY_CARE_PROVIDER_SITE_OTHER): Payer: PPO

## 2020-10-19 ENCOUNTER — Other Ambulatory Visit: Payer: Self-pay

## 2020-10-19 DIAGNOSIS — R0600 Dyspnea, unspecified: Secondary | ICD-10-CM | POA: Diagnosis not present

## 2020-10-19 DIAGNOSIS — R918 Other nonspecific abnormal finding of lung field: Secondary | ICD-10-CM | POA: Diagnosis not present

## 2020-10-19 DIAGNOSIS — J301 Allergic rhinitis due to pollen: Secondary | ICD-10-CM | POA: Diagnosis not present

## 2020-10-19 DIAGNOSIS — R0609 Other forms of dyspnea: Secondary | ICD-10-CM | POA: Diagnosis not present

## 2020-10-19 DIAGNOSIS — J440 Chronic obstructive pulmonary disease with acute lower respiratory infection: Secondary | ICD-10-CM | POA: Insufficient documentation

## 2020-10-19 DIAGNOSIS — E1165 Type 2 diabetes mellitus with hyperglycemia: Secondary | ICD-10-CM

## 2020-10-19 DIAGNOSIS — J209 Acute bronchitis, unspecified: Secondary | ICD-10-CM | POA: Insufficient documentation

## 2020-10-19 DIAGNOSIS — Z8709 Personal history of other diseases of the respiratory system: Secondary | ICD-10-CM | POA: Diagnosis not present

## 2020-10-19 LAB — POCT I-STAT CREATININE: Creatinine, Ser: 1.1 mg/dL (ref 0.61–1.24)

## 2020-10-19 MED ORDER — IOHEXOL 300 MG/ML  SOLN
75.0000 mL | Freq: Once | INTRAMUSCULAR | Status: AC | PRN
  Administered 2020-10-19: 75 mL via INTRAVENOUS

## 2020-10-20 ENCOUNTER — Ambulatory Visit: Payer: PPO

## 2020-11-03 ENCOUNTER — Other Ambulatory Visit: Payer: Self-pay

## 2020-11-03 ENCOUNTER — Ambulatory Visit: Payer: PPO

## 2020-11-03 DIAGNOSIS — J301 Allergic rhinitis due to pollen: Secondary | ICD-10-CM

## 2020-11-08 DIAGNOSIS — J449 Chronic obstructive pulmonary disease, unspecified: Secondary | ICD-10-CM | POA: Diagnosis not present

## 2020-11-08 DIAGNOSIS — E7800 Pure hypercholesterolemia, unspecified: Secondary | ICD-10-CM | POA: Diagnosis not present

## 2020-11-08 DIAGNOSIS — R0600 Dyspnea, unspecified: Secondary | ICD-10-CM | POA: Diagnosis not present

## 2020-11-08 DIAGNOSIS — E119 Type 2 diabetes mellitus without complications: Secondary | ICD-10-CM | POA: Diagnosis not present

## 2020-11-08 DIAGNOSIS — I10 Essential (primary) hypertension: Secondary | ICD-10-CM | POA: Diagnosis not present

## 2020-11-08 DIAGNOSIS — R0602 Shortness of breath: Secondary | ICD-10-CM | POA: Diagnosis not present

## 2020-11-08 DIAGNOSIS — G4733 Obstructive sleep apnea (adult) (pediatric): Secondary | ICD-10-CM | POA: Diagnosis not present

## 2020-11-17 ENCOUNTER — Ambulatory Visit: Payer: PPO

## 2020-11-17 ENCOUNTER — Other Ambulatory Visit: Payer: Self-pay

## 2020-11-17 DIAGNOSIS — J301 Allergic rhinitis due to pollen: Secondary | ICD-10-CM

## 2020-11-23 DIAGNOSIS — R0600 Dyspnea, unspecified: Secondary | ICD-10-CM | POA: Diagnosis not present

## 2020-11-23 DIAGNOSIS — R0602 Shortness of breath: Secondary | ICD-10-CM | POA: Diagnosis not present

## 2020-12-01 ENCOUNTER — Ambulatory Visit: Payer: PPO

## 2020-12-01 ENCOUNTER — Other Ambulatory Visit: Payer: Self-pay

## 2020-12-01 DIAGNOSIS — H8110 Benign paroxysmal vertigo, unspecified ear: Secondary | ICD-10-CM | POA: Diagnosis not present

## 2020-12-01 DIAGNOSIS — J301 Allergic rhinitis due to pollen: Secondary | ICD-10-CM | POA: Diagnosis not present

## 2020-12-01 DIAGNOSIS — I10 Essential (primary) hypertension: Secondary | ICD-10-CM | POA: Diagnosis not present

## 2020-12-01 DIAGNOSIS — G4733 Obstructive sleep apnea (adult) (pediatric): Secondary | ICD-10-CM | POA: Diagnosis not present

## 2020-12-01 DIAGNOSIS — J449 Chronic obstructive pulmonary disease, unspecified: Secondary | ICD-10-CM | POA: Diagnosis not present

## 2020-12-07 DIAGNOSIS — E7800 Pure hypercholesterolemia, unspecified: Secondary | ICD-10-CM | POA: Diagnosis not present

## 2020-12-07 DIAGNOSIS — K760 Fatty (change of) liver, not elsewhere classified: Secondary | ICD-10-CM | POA: Diagnosis not present

## 2020-12-07 DIAGNOSIS — J449 Chronic obstructive pulmonary disease, unspecified: Secondary | ICD-10-CM | POA: Diagnosis not present

## 2020-12-07 DIAGNOSIS — K219 Gastro-esophageal reflux disease without esophagitis: Secondary | ICD-10-CM | POA: Diagnosis not present

## 2020-12-07 DIAGNOSIS — E1165 Type 2 diabetes mellitus with hyperglycemia: Secondary | ICD-10-CM | POA: Diagnosis not present

## 2020-12-07 DIAGNOSIS — I10 Essential (primary) hypertension: Secondary | ICD-10-CM | POA: Diagnosis not present

## 2020-12-07 DIAGNOSIS — K449 Diaphragmatic hernia without obstruction or gangrene: Secondary | ICD-10-CM | POA: Diagnosis not present

## 2020-12-07 DIAGNOSIS — Z23 Encounter for immunization: Secondary | ICD-10-CM | POA: Diagnosis not present

## 2020-12-13 DIAGNOSIS — I10 Essential (primary) hypertension: Secondary | ICD-10-CM | POA: Diagnosis not present

## 2020-12-13 DIAGNOSIS — J449 Chronic obstructive pulmonary disease, unspecified: Secondary | ICD-10-CM | POA: Diagnosis not present

## 2020-12-13 DIAGNOSIS — M25562 Pain in left knee: Secondary | ICD-10-CM | POA: Diagnosis not present

## 2020-12-13 DIAGNOSIS — E119 Type 2 diabetes mellitus without complications: Secondary | ICD-10-CM | POA: Diagnosis not present

## 2020-12-15 ENCOUNTER — Ambulatory Visit (INDEPENDENT_AMBULATORY_CARE_PROVIDER_SITE_OTHER): Payer: PPO

## 2020-12-15 ENCOUNTER — Other Ambulatory Visit: Payer: Self-pay

## 2020-12-15 DIAGNOSIS — J301 Allergic rhinitis due to pollen: Secondary | ICD-10-CM

## 2020-12-24 DIAGNOSIS — I10 Essential (primary) hypertension: Secondary | ICD-10-CM | POA: Diagnosis not present

## 2020-12-24 DIAGNOSIS — E7800 Pure hypercholesterolemia, unspecified: Secondary | ICD-10-CM | POA: Diagnosis not present

## 2020-12-24 DIAGNOSIS — E119 Type 2 diabetes mellitus without complications: Secondary | ICD-10-CM | POA: Diagnosis not present

## 2020-12-24 DIAGNOSIS — M25562 Pain in left knee: Secondary | ICD-10-CM | POA: Diagnosis not present

## 2020-12-24 DIAGNOSIS — K219 Gastro-esophageal reflux disease without esophagitis: Secondary | ICD-10-CM | POA: Diagnosis not present

## 2020-12-24 DIAGNOSIS — K449 Diaphragmatic hernia without obstruction or gangrene: Secondary | ICD-10-CM | POA: Diagnosis not present

## 2020-12-24 DIAGNOSIS — K6289 Other specified diseases of anus and rectum: Secondary | ICD-10-CM | POA: Diagnosis not present

## 2020-12-24 DIAGNOSIS — J449 Chronic obstructive pulmonary disease, unspecified: Secondary | ICD-10-CM | POA: Diagnosis not present

## 2020-12-28 DIAGNOSIS — J301 Allergic rhinitis due to pollen: Secondary | ICD-10-CM | POA: Diagnosis not present

## 2020-12-29 ENCOUNTER — Ambulatory Visit: Payer: PPO

## 2021-01-05 ENCOUNTER — Other Ambulatory Visit (HOSPITAL_COMMUNITY): Payer: Self-pay | Admitting: Physician Assistant

## 2021-01-05 ENCOUNTER — Other Ambulatory Visit: Payer: Self-pay | Admitting: Physician Assistant

## 2021-01-05 DIAGNOSIS — M2392 Unspecified internal derangement of left knee: Secondary | ICD-10-CM | POA: Diagnosis not present

## 2021-01-05 DIAGNOSIS — M25562 Pain in left knee: Secondary | ICD-10-CM | POA: Diagnosis not present

## 2021-01-09 ENCOUNTER — Other Ambulatory Visit: Payer: Self-pay

## 2021-01-09 ENCOUNTER — Ambulatory Visit
Admission: RE | Admit: 2021-01-09 | Discharge: 2021-01-09 | Disposition: A | Discharge status: Home / Self Care | Payer: PPO | Source: Ambulatory Visit | Attending: Physician Assistant | Admitting: Physician Assistant

## 2021-01-09 DIAGNOSIS — M25562 Pain in left knee: Secondary | ICD-10-CM | POA: Diagnosis not present

## 2021-01-09 DIAGNOSIS — M2392 Unspecified internal derangement of left knee: Secondary | ICD-10-CM

## 2021-01-13 ENCOUNTER — Ambulatory Visit: Payer: PPO | Admitting: Nurse Practitioner

## 2021-01-13 ENCOUNTER — Encounter: Payer: Self-pay | Admitting: Nurse Practitioner

## 2021-01-13 ENCOUNTER — Other Ambulatory Visit: Payer: Self-pay

## 2021-01-13 VITALS — BP 128/84 | HR 70 | Temp 98.0°F | Resp 16 | Ht 68.0 in | Wt 237.2 lb

## 2021-01-13 DIAGNOSIS — J301 Allergic rhinitis due to pollen: Secondary | ICD-10-CM

## 2021-01-13 DIAGNOSIS — J4520 Mild intermittent asthma, uncomplicated: Secondary | ICD-10-CM | POA: Diagnosis not present

## 2021-01-13 NOTE — Progress Notes (Signed)
[facility] [place], [place] [address]  Internal MEDICINE  Office Visit Note  Patient Name: Charles Mata  [id number]  [id number]  Date of Service: 01/15/2021  Chief Complaint  Patient presents with  . Follow-up    Pulm, also wants to stop allergy injection    HPI Charles Mata presents for a follow up visit regarding COPD and to discuss stopping his immunotherapy injections. He has been getting immunotherapy injections for his allergies for 2-3 years and states that it is not helping him. Coming to the office for injections has been interfering with home and work life.  He currently takes montelukast at night.  -he is currently using combivent respimat and has duoneb and albuterol nebulizer treatment as needed. He reports that this has been effective and his breathing is controlled at this time.    Current Medication: Outpatient Encounter Medications as of 01/13/2021  Medication Sig  . albuterol (PROVENTIL) (2.5 MG/3ML) 0.083% nebulizer solution Take 3 mLs (2.5 mg total) by nebulization every 6 (six) hours as needed for wheezing or shortness of breath.  Marland Kitchen aspirin EC 81 MG tablet Take by mouth.  . [facility] VITAMIN B12 1000 MCG tablet TAKE 1 TABLET (1,000 MCG TOTAL) BY MOUTH ONCE DAILY.  Marland Kitchen EPINEPHrine (EPIPEN 2-PAK) 0.3 mg/0.3 mL IJ SOAJ injection Inject 0.3 mLs (0.3 mg total) into the muscle as needed for anaphylaxis.  . furosemide (LASIX) 40 MG tablet TAKE 1 TABLET (40 MG TOTAL) BY MOUTH ONCE DAILY.  Marland Kitchen glucose blood (CONTOUR NEXT TEST) test strip   . glyBURIDE (DIABETA) 5 MG tablet TAKE 1 TABLET BY MOUTH EVERY DAY WITH BREAKFAST  . HYDROcodone-acetaminophen (NORCO/VICODIN) 5-325 MG tablet Take 1 tablet by mouth every 6 (six) hours as needed for moderate pain.  . Ipratropium-Albuterol (COMBIVENT RESPIMAT) 20-100 MCG/ACT AERS respimat INHALE 1 PUFF BY MOUTH 4 TIMES A DAY AS NEEDED  . ipratropium-albuterol (DUONEB) 0.5-2.5 (3) MG/3ML SOLN Take 3 mLs by nebulization every 4  (four) hours as needed.  . linaclotide (LINZESS) 72 MCG capsule Take 72 mcg by mouth daily before breakfast.  . lisinopril (PRINIVIL,ZESTRIL) 5 MG tablet Take by mouth.  . Misc. Devices MISC by Does not apply route. cpap  . montelukast (SINGULAIR) 10 MG tablet TAKE 1 TABLET BY MOUTH EVERY DAY  . omeprazole (PRILOSEC) 40 MG capsule TAKE 1 CAPSULE BY MOUTH EVERY DAY  . oxymetazoline (NASAL RELIEF) 0.05 % nasal spray Place into the nose.  . rosuvastatin (CRESTOR) 10 MG tablet Take 5 mg by mouth daily.  . tamsulosin (FLOMAX) 0.4 MG CAPS capsule TAKE ONE CAPSULE BY MOUTH EVERY DAY 1/2 HOUR AFTER A MEAL  . Vitamin D, Ergocalciferol, (DRISDOL) 50000 units CAPS capsule TAKE ONE CAPSULE BY MOUTH ONE TIME PER WEEK   No facility-administered encounter medications on file as of 01/13/2021.    Surgical History: Past Surgical History:  Procedure Laterality Date  . COLONOSCOPY    . heart catheriation      Medical History: Past Medical History:  Diagnosis Date  . Asthma   . Diabetes ([place])   . Hyperlipidemia   . Hypertension   . Sickle cell trait ([place])   . Sleep apnea     Family History: Family History  Problem Relation Age of Onset  . Diabetes Mother   . Moyamoya disease Father     Social History   Socioeconomic History  . Marital status: Widowed    Spouse name: Not on file  . Number of children: Not on file  .  Years of education: Not on file  . Highest education level: Not on file  Occupational History  . Not on file  Tobacco Use  . Smoking status: Former Research scientist (life sciences)  . Smokeless tobacco: Never Used  Vaping Use  . Vaping Use: Never used  Substance and Sexual Activity  . Alcohol use: Not Currently    Alcohol/week: 2.0 standard drinks    Types: 2 Glasses of wine per week    Comment: occ  . Drug use: No  . Sexual activity: Not on file  Other Topics Concern  . Not on file  Social History Narrative  . Not on file   Social Determinants of Health   Financial Resource Strain:  Not on file  Food Insecurity: Not on file  Transportation Needs: Not on file  Physical Activity: Not on file  Stress: Not on file  Social Connections: Not on file  Intimate Partner Violence: Not on file      Review of Systems  Constitutional: Negative for chills, fatigue and unexpected weight change.  HENT: Negative for congestion, rhinorrhea, sneezing and sore throat.   Eyes: Negative for redness.  Respiratory: Negative for cough, chest tightness and shortness of breath.   Cardiovascular: Negative for chest pain and palpitations.  Gastrointestinal: Negative for abdominal pain, constipation, diarrhea, nausea and vomiting.  Genitourinary: Negative for dysuria and frequency.  Musculoskeletal: Negative for arthralgias, back pain, joint swelling and neck pain.  Skin: Negative for rash.  Neurological: Negative.  Negative for tremors and numbness.  Hematological: Negative for adenopathy. Does not bruise/bleed easily.  Psychiatric/Behavioral: Negative for behavioral problems (Depression), sleep disturbance and suicidal ideas. The patient is not nervous/anxious.     Vital Signs: BP 128/84   Pulse 70   Temp 98 F (36.7 C)   Resp 16   Ht 5\' 8"  (1.727 m)   Wt 237 lb 3.2 oz (107.6 kg)   SpO2 96%   BMI 36.07 kg/m    Physical Exam Vitals reviewed.  Constitutional:      General: He is not in acute distress.    Appearance: Normal appearance. He is morbidly obese. He is not ill-appearing.  HENT:     Head: Normocephalic and atraumatic.  Cardiovascular:     Rate and Rhythm: Normal rate and regular rhythm.     Pulses: Normal pulses.     Heart sounds: Normal heart sounds.  Pulmonary:     Effort: Pulmonary effort is normal. No respiratory distress.     Breath sounds: Normal breath sounds. No stridor. No wheezing, rhonchi or rales.  Chest:     Chest wall: No tenderness.  Skin:    General: Skin is warm and dry.     Capillary Refill: Capillary refill takes less than 2 seconds.   Neurological:     Mental Status: He is alert and oriented to person, place, and time.  Psychiatric:        Mood and Affect: Mood normal.    Assessment/Plan: 1. Mild intermittent chronic asthma without complication Stable with current medication, no recent pulmonary function test, will repeat to assess current respiratory status.  - Pulmonary Function Test; Future  2. Seasonal allergic rhinitis due to pollen Patient no longer wants allergy immunotherapy injections. Injections will be discontinued.    General Counseling: juno bozard understanding of the findings of todays visit and agrees with plan of treatment. I have discussed any further diagnostic evaluation that may be needed or ordered today. We also reviewed his medications today. he has been encouraged  to call the office with any questions or concerns that should arise related to todays visit.    Orders Placed This Encounter  Procedures  . Pulmonary Function Test    No orders of the defined types were placed in this encounter.   Return in about 1 month (around 02/12/2021) for F/U, PFT @ [place], follow up with Jeroline Wolbert after PFT.Marland Kitchen   Total time spent: 30 Minutes Time spent includes review of chart, medications, test results, and follow up plan with the patient.   [place] Controlled Substance Database was reviewed by me.  This patient was seen by Jonetta Osgood, FNP-C in collaboration with Dr. Clayborn Bigness as a part of collaborative care agreement.   Cannen Dupras R. Valetta Fuller, MSN, FNP-C Internal medicine

## 2021-01-27 ENCOUNTER — Other Ambulatory Visit: Payer: Self-pay | Admitting: Orthopedic Surgery

## 2021-01-27 DIAGNOSIS — M2392 Unspecified internal derangement of left knee: Secondary | ICD-10-CM | POA: Diagnosis not present

## 2021-01-27 DIAGNOSIS — S83242A Other tear of medial meniscus, current injury, left knee, initial encounter: Secondary | ICD-10-CM | POA: Diagnosis not present

## 2021-01-28 ENCOUNTER — Other Ambulatory Visit: Payer: Self-pay

## 2021-01-28 ENCOUNTER — Encounter
Admission: RE | Admit: 2021-01-28 | Discharge: 2021-01-28 | Disposition: A | Discharge status: Home / Self Care | Payer: PPO | Source: Ambulatory Visit | Attending: Orthopedic Surgery | Admitting: Orthopedic Surgery

## 2021-01-28 HISTORY — DX: Benign prostatic hyperplasia without lower urinary tract symptoms: N40.0

## 2021-01-28 HISTORY — DX: Other complications of anesthesia, initial encounter: T88.59XA

## 2021-01-28 HISTORY — DX: Gastro-esophageal reflux disease without esophagitis: K21.9

## 2021-01-28 HISTORY — DX: Unspecified osteoarthritis, unspecified site: M19.90

## 2021-01-28 NOTE — Patient Instructions (Addendum)
INSTRUCTIONS FOR SURGERY     Your surgery is scheduled for:   Tuesday, June 21ST     To find out your arrival time for the day of surgery,          please call [phone] between 1 pm and 3 pm on :  Monday, June 20TH     When you arrive for surgery, report to the [facility].  ONCE THEY HAVE COMPLETED THEIR PROCESS, PROCEED TO THE SECOND FLOOR AND SIGN IN AT THE SURGERY DESK.    REMEMBER: Instructions that are not followed completely may result in serious medical risk,  up to and including death, or upon the discretion of your surgeon and anesthesiologist,            your surgery may need to be rescheduled.  __X__ 1. Do not eat food after midnight the night before your procedure.                    No gum, candy, lozenger, tic tacs, tums or hard candies.                  ABSOLUTELY NOTHING SOLID IN YOUR MOUTH AFTER MIDNIGHT                    You may drink unlimited clear liquids up to 2 hours before you are scheduled to arrive for surgery.                   BEING DIABETIC, WATER WOULD BE BEST.                  Do not drink anything within those 2 hours unless you need to take medicine, then take the                   smallest amount you need.  Clear liquids include:  water, apple juice without pulp,                   any flavor Gatorade, Black coffee, black tea.  Sugar may be added but no dairy/ honey /lemon.                        Broth and jello is not considered a clear liquid.  __x__  2. On the morning of surgery, please brush your teeth with toothpaste and water. You may rinse with                  mouthwash if you wish but DO NOT SWALLOW TOOTHPASTE OR MOUTHWASH  __X___3. NO alcohol for 24 hours before or after surgery.  __x___ 4.  Do NOT smoke or use e-cigarettes for 24 HOURS PRIOR TO SURGERY.                      DO NOT  Use any chewable tobacco products for at least 6  hours prior to surgery.  __x___ 5. If you start any new medication after this appointment and prior to surgery, please  Bring it with you on the day of surgery.  ___x__ 6. Notify your doctor if there is any change in your medical condition, such as fever,                   infection, vomitting, diarrhea or any open sores.  __x___ 7.  USE the CHG SOAP as instructed, the night before surgery and the day of surgery.                   Once you have washed with this soap, do NOT use any of the following: Powders, perfumes                    or lotions. Please do not wear make up, hairpins, clips or nail polish. You may wear deodorant.                                                                  Men may shave their face and neck.                     DO NOT wear ANY jewelry on the day of surgery. If there are rings that are too tight to                    remove easily, please address this prior to the surgery day. Piercings need to be removed.                                                                     NO METAL ON YOUR BODY.                   Do NOT bring any valuables.  If you came to Pre-Admit testing then you will not need license,                    insurance card or credit card.  If you will be staying overnight, please either leave your things in                    the car or have your family be responsible for these items.                     IS NOT RESPONSIBLE FOR BELONGINGS OR VALUABLES.  ___X__ 8. DO NOT wear contact lenses on surgery day.  You may not have dentures,                     Hearing aides, contacts or glasses in the operating room. These items can be                    Placed in the Recovery Room to receive immediately after surgery.  __x___ 9. IF YOU ARE SCHEDULED TO GO HOME ON THE SAME DAY, YOU MUST  Have someone to drive you home and to stay with you  for the first 24 hours.                    Have an arrangement  prior to arriving on surgery day.  ___x__ 10. Take the following medications on the morning of surgery with a sip of water:                              1. PRILOSEC (take an extra dose the night before surgery)                     2. FLOMAX                     3. FLOVENT DISCUS                     CONTINUE TO TAKE YOUR EVENING MEDICATIONS AS USUAL.  __X__  12. STOP ALL ASPIRIN PRODUCTS TODAY  (01/28/21)                       THIS INCLUDES BC POWDERS / GOODIES POWDER  __x___ 13. STOP Anti-inflammatories as of TODAY  (01/28/21)                      This includes IBUPROFEN / MOTRIN / ADVIL / [place] TO SURGERY.  __X___ 14.  Stop supplements until after surgery.                     This includes: ACIDOPHILUS // PREVAGEN // VITAMIN B12 // VITAMIN D              ___X__ 78. Bring your CPAP machine into preop with you on the morning of surgery.  ______16. CONTINUE TO TAKE YOUR GLIPIZIDE, BUT DO NOT TAKE ANY OF DAY OF SURGERY.                                                         Do NOT take any diabetes medications on surgery day.  ____X__17.  Continue to take the following medications but do not take on the morning of surgery:                           LISINOPRIL // LINZESS  ___X___18. Wear clean and comfortable clothing to the hospital. HAVE LOOSE OR STRETCHY PANTS.  BRING PHONE NUMBERS FOR YOUR CONTACTS. [place] PHONE.

## 2021-01-28 NOTE — Pre-Procedure Instructions (Addendum)
Left a voice message to pt. mailbox to call us back at Pre admit testing today in order to complete the phone interview. Otherwise we have to re-schedule his phone interview on Monday January 31, 2021 with the procedure date of  02/01/2021. Pt. also needs EKG done by Monday due to HTN.

## 2021-01-31 ENCOUNTER — Other Ambulatory Visit
Admission: RE | Admit: 2021-01-31 | Discharge: 2021-01-31 | Disposition: A | Discharge status: Home / Self Care | Payer: PPO | Source: Ambulatory Visit | Attending: Orthopedic Surgery | Admitting: Orthopedic Surgery

## 2021-01-31 ENCOUNTER — Other Ambulatory Visit: Payer: Self-pay

## 2021-01-31 DIAGNOSIS — I10 Essential (primary) hypertension: Secondary | ICD-10-CM | POA: Diagnosis not present

## 2021-01-31 DIAGNOSIS — Z01810 Encounter for preprocedural cardiovascular examination: Secondary | ICD-10-CM | POA: Diagnosis not present

## 2021-02-01 ENCOUNTER — Encounter
Admission: RE | Disposition: A | Discharge status: Home / Self Care | Payer: Self-pay | Source: Home / Self Care | Attending: Orthopedic Surgery

## 2021-02-01 ENCOUNTER — Encounter: Payer: Self-pay | Admitting: Orthopedic Surgery

## 2021-02-01 ENCOUNTER — Ambulatory Visit: Payer: PPO | Admitting: Urgent Care

## 2021-02-01 ENCOUNTER — Ambulatory Visit
Admission: RE | Admit: 2021-02-01 | Discharge: 2021-02-01 | Disposition: A | Discharge status: Home / Self Care | Payer: PPO | Attending: Orthopedic Surgery | Admitting: Orthopedic Surgery

## 2021-02-01 DIAGNOSIS — S83282A Other tear of lateral meniscus, current injury, left knee, initial encounter: Secondary | ICD-10-CM | POA: Insufficient documentation

## 2021-02-01 DIAGNOSIS — Z79899 Other long term (current) drug therapy: Secondary | ICD-10-CM | POA: Diagnosis not present

## 2021-02-01 DIAGNOSIS — Z888 Allergy status to other drugs, medicaments and biological substances status: Secondary | ICD-10-CM | POA: Diagnosis not present

## 2021-02-01 DIAGNOSIS — Z886 Allergy status to analgesic agent status: Secondary | ICD-10-CM | POA: Insufficient documentation

## 2021-02-01 DIAGNOSIS — X58XXXA Exposure to other specified factors, initial encounter: Secondary | ICD-10-CM | POA: Diagnosis not present

## 2021-02-01 DIAGNOSIS — E785 Hyperlipidemia, unspecified: Secondary | ICD-10-CM | POA: Diagnosis not present

## 2021-02-01 DIAGNOSIS — Z7984 Long term (current) use of oral hypoglycemic drugs: Secondary | ICD-10-CM | POA: Insufficient documentation

## 2021-02-01 DIAGNOSIS — Z8719 Personal history of other diseases of the digestive system: Secondary | ICD-10-CM | POA: Diagnosis not present

## 2021-02-01 DIAGNOSIS — S83242A Other tear of medial meniscus, current injury, left knee, initial encounter: Secondary | ICD-10-CM | POA: Insufficient documentation

## 2021-02-01 DIAGNOSIS — Z7982 Long term (current) use of aspirin: Secondary | ICD-10-CM | POA: Diagnosis not present

## 2021-02-01 DIAGNOSIS — S83272A Complex tear of lateral meniscus, current injury, left knee, initial encounter: Secondary | ICD-10-CM | POA: Diagnosis not present

## 2021-02-01 DIAGNOSIS — Z87891 Personal history of nicotine dependence: Secondary | ICD-10-CM | POA: Insufficient documentation

## 2021-02-01 DIAGNOSIS — Z7951 Long term (current) use of inhaled steroids: Secondary | ICD-10-CM | POA: Diagnosis not present

## 2021-02-01 DIAGNOSIS — M2392 Unspecified internal derangement of left knee: Secondary | ICD-10-CM | POA: Diagnosis not present

## 2021-02-01 DIAGNOSIS — D573 Sickle-cell trait: Secondary | ICD-10-CM | POA: Diagnosis not present

## 2021-02-01 HISTORY — PX: KNEE ARTHROSCOPY WITH MEDIAL MENISECTOMY: SHX5651

## 2021-02-01 LAB — GLUCOSE, CAPILLARY
Glucose-Capillary: 135 mg/dL — ABNORMAL HIGH (ref 70–99)
Glucose-Capillary: 120 mg/dL — ABNORMAL HIGH (ref 70–99)

## 2021-02-01 SURGERY — ARTHROSCOPY, KNEE, WITH MEDIAL MENISCECTOMY
Anesthesia: General | Site: Knee | Laterality: Left

## 2021-02-01 MED ORDER — ORAL CARE MOUTH RINSE: 15.0000 mL | Freq: Once | OROMUCOSAL | Status: AC

## 2021-02-01 MED ORDER — LACTATED RINGERS IR SOLN
Status: AC | PRN
  Administered 2021-02-01: 7850 mL

## 2021-02-01 MED ORDER — SEVOFLURANE IN SOLN
RESPIRATORY_TRACT | Status: AC
  Filled 2021-02-01: qty 250

## 2021-02-01 MED ORDER — HYDROCODONE-ACETAMINOPHEN 5-325 MG PO TABS
ORAL_TABLET | ORAL | Status: AC
  Administered 2021-02-01: 2 via ORAL
  Filled 2021-02-01: qty 2

## 2021-02-01 MED ORDER — DEXAMETHASONE SODIUM PHOSPHATE 10 MG/ML IJ SOLN
INTRAMUSCULAR | Status: AC
  Filled 2021-02-01: qty 1

## 2021-02-01 MED ORDER — DEXMEDETOMIDINE (PRECEDEX) IN NS 20 MCG/5ML (4 MCG/ML) IV SYRINGE
PREFILLED_SYRINGE | INTRAVENOUS | Status: AC
  Filled 2021-02-01: qty 5

## 2021-02-01 MED ORDER — CHLORHEXIDINE GLUCONATE 0.12 % MT SOLN: 15.0000 mL | Freq: Once | OROMUCOSAL | Status: AC

## 2021-02-01 MED ORDER — ONDANSETRON HCL 4 MG/2ML IJ SOLN
INTRAMUSCULAR | Status: AC
  Filled 2021-02-01: qty 2

## 2021-02-01 MED ORDER — BUPIVACAINE HCL (PF) 0.5 % IJ SOLN
INTRAMUSCULAR | Status: AC
  Filled 2021-02-01: qty 30

## 2021-02-01 MED ORDER — MEPERIDINE HCL 25 MG/ML IJ SOLN: 6.2500 mg | INTRAMUSCULAR | Status: DC | PRN

## 2021-02-01 MED ORDER — LIDOCAINE-EPINEPHRINE 1 %-1:100000 IJ SOLN
INTRAMUSCULAR | Status: AC
  Filled 2021-02-01: qty 1

## 2021-02-01 MED ORDER — BUPIVACAINE HCL (PF) 0.5 % IJ SOLN
INTRAMUSCULAR | Status: DC | PRN
  Administered 2021-02-01: 6.5 mL

## 2021-02-01 MED ORDER — ACETAMINOPHEN 500 MG PO TABS: 1000.0000 mg | ORAL_TABLET | Freq: Three times a day (TID) | ORAL | 2 refills | Status: AC

## 2021-02-01 MED ORDER — PROPOFOL 10 MG/ML IV BOLUS
INTRAVENOUS | Status: DC | PRN
  Administered 2021-02-01: 160 mg via INTRAVENOUS

## 2021-02-01 MED ORDER — FENTANYL CITRATE (PF) 100 MCG/2ML IJ SOLN
INTRAMUSCULAR | Status: AC
  Filled 2021-02-01: qty 2

## 2021-02-01 MED ORDER — SODIUM CHLORIDE 0.9 % IV SOLN: INTRAVENOUS | Status: DC

## 2021-02-01 MED ORDER — IBUPROFEN 800 MG PO TABS: 800.0000 mg | ORAL_TABLET | Freq: Three times a day (TID) | ORAL | 1 refills | Status: AC

## 2021-02-01 MED ORDER — ASPIRIN EC 325 MG PO TBEC: 325.0000 mg | DELAYED_RELEASE_TABLET | Freq: Every day | ORAL | 0 refills | Status: AC

## 2021-02-01 MED ORDER — ONDANSETRON HCL 4 MG/2ML IJ SOLN: 4.0000 mg | Freq: Once | INTRAMUSCULAR | Status: DC | PRN

## 2021-02-01 MED ORDER — CEFAZOLIN SODIUM-DEXTROSE 2-4 GM/100ML-% IV SOLN
INTRAVENOUS | Status: AC
  Filled 2021-02-01: qty 100

## 2021-02-01 MED ORDER — HYDROCODONE-ACETAMINOPHEN 5-325 MG PO TABS: 2.0000 | ORAL_TABLET | Freq: Once | ORAL | Status: AC

## 2021-02-01 MED ORDER — MIDAZOLAM HCL 2 MG/2ML IJ SOLN
INTRAMUSCULAR | Status: AC
  Filled 2021-02-01: qty 2

## 2021-02-01 MED ORDER — LIDOCAINE HCL (CARDIAC) PF 100 MG/5ML IV SOSY
PREFILLED_SYRINGE | INTRAVENOUS | Status: DC | PRN
  Administered 2021-02-01: 100 mg via INTRAVENOUS

## 2021-02-01 MED ORDER — DEXAMETHASONE SODIUM PHOSPHATE 10 MG/ML IJ SOLN
INTRAMUSCULAR | Status: DC | PRN
  Administered 2021-02-01: 5 mg via INTRAVENOUS

## 2021-02-01 MED ORDER — FENTANYL CITRATE (PF) 100 MCG/2ML IJ SOLN: 25.0000 ug | INTRAMUSCULAR | Status: DC | PRN

## 2021-02-01 MED ORDER — HYDROCODONE-ACETAMINOPHEN 5-325 MG PO TABS: 1.0000 | ORAL_TABLET | ORAL | 0 refills | Status: DC | PRN

## 2021-02-01 MED ORDER — MIDAZOLAM HCL 2 MG/2ML IJ SOLN
INTRAMUSCULAR | Status: DC | PRN
  Administered 2021-02-01: 2 mg via INTRAVENOUS

## 2021-02-01 MED ORDER — FENTANYL CITRATE (PF) 100 MCG/2ML IJ SOLN
INTRAMUSCULAR | Status: DC | PRN
  Administered 2021-02-01 (×2): 50 ug via INTRAVENOUS

## 2021-02-01 MED ORDER — ONDANSETRON HCL 4 MG/2ML IJ SOLN
INTRAMUSCULAR | Status: DC | PRN
  Administered 2021-02-01: 4 mg via INTRAVENOUS

## 2021-02-01 MED ORDER — DEXMEDETOMIDINE (PRECEDEX) IN NS 20 MCG/5ML (4 MCG/ML) IV SYRINGE
PREFILLED_SYRINGE | INTRAVENOUS | Status: DC | PRN
  Administered 2021-02-01: 8 ug via INTRAVENOUS

## 2021-02-01 MED ORDER — PROPOFOL 10 MG/ML IV BOLUS
INTRAVENOUS | Status: AC
  Filled 2021-02-01: qty 40

## 2021-02-01 MED ORDER — EPINEPHRINE PF 1 MG/ML IJ SOLN
INTRAMUSCULAR | Status: AC
  Filled 2021-02-01: qty 1

## 2021-02-01 MED ORDER — LIDOCAINE-EPINEPHRINE 1 %-1:100000 IJ SOLN
INTRAMUSCULAR | Status: DC | PRN
  Administered 2021-02-01: 6.5 mL

## 2021-02-01 MED ORDER — ONDANSETRON 4 MG PO TBDP: 4.0000 mg | ORAL_TABLET | Freq: Three times a day (TID) | ORAL | 0 refills | Status: DC | PRN

## 2021-02-01 MED ORDER — CHLORHEXIDINE GLUCONATE 0.12 % MT SOLN
OROMUCOSAL | Status: AC
  Administered 2021-02-01: 15 mL via OROMUCOSAL
  Filled 2021-02-01: qty 15

## 2021-02-01 MED ORDER — CEFAZOLIN SODIUM-DEXTROSE 2-4 GM/100ML-% IV SOLN
2.0000 g | INTRAVENOUS | Status: AC
  Administered 2021-02-01: 2 g via INTRAVENOUS

## 2021-02-01 SURGICAL SUPPLY — 48 items
ADAPTER IRRIG TUBE 2 SPIKE SOL (ADAPTER) ×4 IMPLANT
BASIN GRAD PLASTIC 32OZ STRL (MISCELLANEOUS) ×2 IMPLANT
BLADE SURG SZ11 CARB STEEL (BLADE) ×2 IMPLANT
BNDG COHESIVE 6X5 TAN STRL LF (GAUZE/BANDAGES/DRESSINGS) ×2 IMPLANT
BNDG ELASTIC 6X5.8 VLCR STR LF (GAUZE/BANDAGES/DRESSINGS) ×2 IMPLANT
BNDG ESMARK 6X12 TAN STRL LF (GAUZE/BANDAGES/DRESSINGS) ×2 IMPLANT
BUR RADIUS 3.5 (BURR) IMPLANT
BUR RADIUS 4.0X18.5 (BURR) IMPLANT
CAST PADDING 6X4YD ST 30248 (SOFTGOODS) ×1
CHLORAPREP W/TINT 26 (MISCELLANEOUS) ×2 IMPLANT
COOLER POLAR GLACIER W/PUMP (MISCELLANEOUS) ×2 IMPLANT
COVER WAND RF STERILE (DRAPES) ×2 IMPLANT
CUFF TOURN SGL QUICK 24 (TOURNIQUET CUFF)
CUFF TOURN SGL QUICK 34 (TOURNIQUET CUFF) ×1
CUFF TRNQT CYL 24X4X16.5-23 (TOURNIQUET CUFF) IMPLANT
CUFF TRNQT CYL 34X4.125X (TOURNIQUET CUFF) ×1 IMPLANT
DEVICE SUCT BLK HOLE OR FLOOR (MISCELLANEOUS) ×2 IMPLANT
DRAPE ARTHRO LIMB 89X125 STRL (DRAPES) ×2 IMPLANT
DRAPE IMP U-DRAPE 54X76 (DRAPES) ×2 IMPLANT
ELECT REM PT RETURN 9FT ADLT (ELECTROSURGICAL) ×2
ELECTRODE REM PT RTRN 9FT ADLT (ELECTROSURGICAL) ×1 IMPLANT
GAUZE SPONGE 4X4 12PLY STRL (GAUZE/BANDAGES/DRESSINGS) ×2 IMPLANT
GLOVE SRG 8 PF TXTR STRL LF DI (GLOVE) ×1 IMPLANT
GLOVE SURG ORTHO LTX SZ8 (GLOVE) ×4 IMPLANT
GLOVE SURG UNDER POLY LF SZ8 (GLOVE) ×1
GOWN STRL REUS W/ TWL LRG LVL3 (GOWN DISPOSABLE) ×1 IMPLANT
GOWN STRL REUS W/ TWL XL LVL3 (GOWN DISPOSABLE) ×1 IMPLANT
GOWN STRL REUS W/TWL LRG LVL3 (GOWN DISPOSABLE) ×1
GOWN STRL REUS W/TWL XL LVL3 (GOWN DISPOSABLE) ×1
IV LACTATED RINGER IRRG 3000ML (IV SOLUTION) ×6
IV LR IRRIG 3000ML ARTHROMATIC (IV SOLUTION) ×6 IMPLANT
KIT TURNOVER KIT A (KITS) ×2 IMPLANT
MANIFOLD NEPTUNE II (INSTRUMENTS) ×4 IMPLANT
MAT ABSORB  FLUID 56X50 GRAY (MISCELLANEOUS) ×1
MAT ABSORB FLUID 56X50 GRAY (MISCELLANEOUS) ×1 IMPLANT
PACK ARTHROSCOPY KNEE (MISCELLANEOUS) ×2 IMPLANT
PAD ABD DERMACEA PRESS 5X9 (GAUZE/BANDAGES/DRESSINGS) ×4 IMPLANT
PAD WRAPON POLAR KNEE (MISCELLANEOUS) ×1 IMPLANT
PADDING CAST COTTON 6X4 ST (SOFTGOODS) ×1 IMPLANT
PENCIL ELECTRO HAND CTR (MISCELLANEOUS) ×2 IMPLANT
SET TUBE SUCT SHAVER OUTFL 24K (TUBING) ×2 IMPLANT
SET TUBE TIP INTRA-ARTICULAR (MISCELLANEOUS) ×2 IMPLANT
SUT ETHILON 3-0 FS-10 30 BLK (SUTURE) ×2
SUTURE EHLN 3-0 FS-10 30 BLK (SUTURE) ×1 IMPLANT
TOWEL OR 17X26 4PK STRL BLUE (TOWEL DISPOSABLE) ×4 IMPLANT
TUBING ARTHRO INFLOW-ONLY STRL (TUBING) ×2 IMPLANT
WAND WEREWOLF FLOW 90D (MISCELLANEOUS) IMPLANT
WRAPON POLAR PAD KNEE (MISCELLANEOUS) ×2

## 2021-02-01 NOTE — Op Note (Signed)
Operative Note    SURGERY DATE: 02/01/2021   PRE-OP DIAGNOSIS:  1. Left medial meniscus tear 2. Left early tricompartmental degenerative changes   POST-OP DIAGNOSIS:  1. Left medial meniscus tear 2. Left lateral meniscus tear  3. Left early tricompartmental degenerative changes   PROCEDURES:  1.  Left knee arthroscopy, partial medial and partial lateral meniscectomy   SURGEON: Cato Mulligan, MD   ANESTHESIA: Gen   ESTIMATED BLOOD LOSS: minimal   TOTAL IV FLUIDS: per anesthesia   INDICATION(S):  Charles Mata is a 70 y.o. male with signs and symptoms as well as MRI finding of medial meniscus tear.  He had failed conservative management.  After discussion of risks, benefits, and alternatives to surgery, the patient elected to proceed.   OPERATIVE FINDINGS:    Examination under anesthesia: A careful examination under anesthesia was performed.  Passive range of motion was: Hyperextension: 1.  Extension: 0.  Flexion: 10.  Lachman: normal. Pivot Shift: normal.  Posterior drawer: normal.  Varus stability in full extension: normal.  Varus stability in 30 degrees of flexion: normal.  Valgus stability in full extension: normal.  Valgus stability in 30 degrees of flexion: normal.   Intra-operative findings: A thorough arthroscopic examination of the knee was performed.  The findings are: 1. Suprapatellar pouch: Normal 2. Undersurface of median ridge: Focal grade 3 degenerative changes 3. Medial patellar facet: Areas of grade 1-2 degenerative changes 4. Lateral patellar facet: Areas of grade 1-2 degenerative changes 5. Trochlea: Grade 1 degenerative changes centrally 6. Lateral gutter/popliteus tendon: Normal 7. Hoffa's fat pad: Inflamed 8. Medial gutter/plica: Normal 9. ACL: Normal 10. PCL: Normal 11. Medial meniscus: Complex tear with primarily horizontal tear pattern affecting the entire width of the posterior horn with small parrot-beak component. 12. Medial compartment  cartilage: Areas of grade 1-2 degenerative changes to the medial femoral condyle and tibial plateau 13. Lateral meniscus: Radial tear of the body affecting approximately 20% of the meniscus width 14. Lateral compartment cartilage: Grade 1-2 degenerative changes to the tibial plateau   OPERATIVE REPORT:     I identified Glori Luis in the pre-operative holding area. I marked the operative knee with my initials. I reviewed the risks and benefits of the proposed surgical intervention and the patient wished to proceed. The patient was transferred to the operative suite and placed in the supine position with all bony prominences padded.  Anesthesia was administered. Appropriate IV antibiotics were administered prior to incision. The extremity was then prepped and draped in standard fashion. A time out was performed confirming the correct extremity, correct patient, and correct procedure.   Arthroscopy portals were marked. Local anesthetic was injected to the planned portal sites. The anterolateral portal was established with an 11 blade.      The arthroscope was placed in the anterolateral portal and then into the suprapatellar pouch. Next, the medial portal was established under needle localization. A diagnostic knee scope was completed with the above findings. The lateral meniscus tear was identified.  An arthroscopic biter was utilized to resect the radial component of the lateral meniscus tear.  After debridement, approximately 80% of the meniscus width at the body was intact.    Next, the medial meniscus tear was identified. The MCL was pie-crusted to improve visualization of the posterior horn. The meniscal tear was debrided using an arthroscopic biter and an oscillating shaver until the meniscus had stable borders. Arthroscopic fluid was removed from the joint.   The portals were closed with  3-0 Nylon suture. Sterile dressings included Xeroform, 4x4s, Sof-Rol, and Bias wrap. A Polarcare was  placed.  The patient was then awakened and taken to the PACU hemodynamically stable without complication.   POSTOPERATIVE PLAN: The patient will be discharged home today once they meet PACU criteria. Aspirin 325 mg daily was prescribed for 2 weeks for DVT prophylaxis.  Physical therapy will start on POD#3-4. Weight-bearing as tolerated. Follow up in 2 weeks per protocol.

## 2021-02-01 NOTE — Transfer of Care (Signed)
Immediate Anesthesia Transfer of Care Note  Patient: Charles Mata  Procedure(s) Performed: Left Arthroscopy partial medial meniscectomy, lateral meniscectomy (Left: Knee)  Patient Location: PACU  Anesthesia Type:General  Level of Consciousness: sedated  Airway & Oxygen Therapy: Patient Spontanous Breathing and Patient connected to face mask oxygen  Post-op Assessment: Report given to RN and Post -op Vital signs reviewed and stable  Post vital signs: Reviewed and stable  Last Vitals:  Vitals Value Taken Time  BP 126/109 02/01/21 1236  Temp    Pulse 72 02/01/21 1239  Resp 12 02/01/21 1239  SpO2 100 % 02/01/21 1239  Vitals shown include unvalidated device data.  Last Pain:  Vitals:   02/01/21 0945  TempSrc: Temporal  PainSc: 0-No pain      Patients Stated Pain Goal: 0 (70/[id number])  Complications: No notable events documented.

## 2021-02-01 NOTE — Progress Notes (Signed)
Patient was given a prescription and was told to take 1000 mg of Tylenol q8 and 1-2 tablets of Hydrocodone-acetaminophen q4 which would take the patients amount of Tylenol to be over the recommended daily allowance also he has a fatty liver which lessens his amount. Dr. Posey Pronto was okay with sending the patient home with those meds but I was not so I called the pharmacist and she stated it was too much also but that since he has only 10 pills it would be okay. I told the patient to take the Hydrocodone for the first few days then switch to taking the Tylenol every 8 hours. Patient understood and will comply.

## 2021-02-01 NOTE — Anesthesia Procedure Notes (Signed)
Procedure Name: LMA Insertion Date/Time: 02/01/2021 11:43 AM Performed by: Aline Brochure, CRNA Pre-anesthesia Checklist: Patient identified, Patient being monitored, Timeout performed, Emergency Drugs available and Suction available Patient Re-evaluated:Patient Re-evaluated prior to induction Oxygen Delivery Method: Circle system utilized Preoxygenation: Pre-oxygenation with 100% oxygen Induction Type: IV induction Ventilation: Mask ventilation without difficulty LMA: LMA inserted LMA Size: 4.5 Tube type: Oral Number of attempts: 1 Placement Confirmation: positive ETCO2 and breath sounds checked- equal and bilateral Tube secured with: Tape Dental Injury: Teeth and Oropharynx as per pre-operative assessment

## 2021-02-01 NOTE — Discharge Instructions (Addendum)
Arthroscopic Knee Surgery - Partial Meniscectomy   Post-Op Instructions   1. Bracing or crutches: Crutches will be provided at the time of discharge from the surgery center if you do not already have them.   2. Ice: You may be provided with a device [facility]) that allows you to ice the affected area effectively. Otherwise you can ice manually.    3. Driving:  Plan on not driving for at least two weeks. Please note that you are advised NOT to drive while taking narcotic pain medications as you may be impaired and unsafe to drive.   4. Activity: Ankle pumps several times an hour while awake to prevent blood clots. Weight bearing: as tolerated. Use crutches for as needed (usually ~1 week or less) until pain allows you to ambulate without a limp. Bending and straightening the knee is unlimited. Elevate knee above heart level as much as possible for one week. Avoid standing more than 5 minutes (consecutively) for the first week.  Avoid long distance travel for 2 weeks.  5. Medications:  - You have been provided a prescription for narcotic pain medicine. After surgery, take 1-2 narcotic tablets every 4 hours if needed for severe pain.  - You may take up to 3000mg /day of tylenol (acetaminophen). You can take 1000mg  3x/day. Please check your narcotic. If you have acetaminophen in your narcotic (each tablet will be 325mg ), be careful not to exceed a total of 3000mg /day of acetaminophen.  - A prescription for anti-nausea medication will be provided in case the narcotic medicine or anesthesia causes nausea - take 1 tablet every 6 hours only if nauseated.  - Take ibuprofen 800 mg every 8 hours WITH food to reduce post-operative knee swelling. However, please discontinue if you have any abdominal discomfort after taking this.  - Take enteric coated aspirin 325 mg once daily for 2 weeks to prevent blood clots.    6. Bandages: The physical therapist should change the bandages at the first post-op  appointment. If needed, the dressing supplies have been provided to you.   7. Physical Therapy: 1-2 times per week for 6 weeks. Therapy typically starts on post operative Day 3 or 4. You have been provided an order for physical therapy. The therapist will provide home exercises.   8. Work: May return to full work usually around 2 weeks after 1st post-operative visit. May do light duty/desk job in approximately 1-2 weeks when off of narcotics, pain is well-controlled, and swelling has decreased. Labor intensive jobs may require 4-6 weeks to return.      9. Post-Op Appointments: Your first post-op appointment will be with Dr. Posey Pronto in approximately 2 weeks time.    If you find that they have not been scheduled please call the Orthopaedic Appointment front desk at [phone]. AMBULATORY SURGERY  DISCHARGE INSTRUCTIONS   The drugs that you were given will stay in your system until tomorrow so for the next 24 hours you should not:  Drive an automobile Make any legal decisions Drink any alcoholic beverage   You may resume regular meals tomorrow.  Today it is better to start with liquids and gradually work up to solid foods.  You may eat anything you prefer, but it is better to start with liquids, then soup and crackers, and gradually work up to solid foods.   Please notify your doctor immediately if you have any unusual bleeding, trouble breathing, redness and pain at the surgery site, drainage, fever, or pain not relieved by medication.  Your post-operative visit with Dr.                                       is: Date:                        Time:    Please call to schedule your post-operative visit.  Additional Instructions:

## 2021-02-01 NOTE — Anesthesia Postprocedure Evaluation (Signed)
Anesthesia Post Note  Patient: Charles Mata  Procedure(s) Performed: Left Arthroscopy partial medial meniscectomy, lateral meniscectomy (Left: Knee)  Patient location during evaluation: PACU Anesthesia Type: General Level of consciousness: awake and alert, awake and oriented Pain management: pain level controlled Vital Signs Assessment: post-procedure vital signs reviewed and stable Respiratory status: spontaneous breathing, nonlabored ventilation and respiratory function stable Cardiovascular status: blood pressure returned to baseline and stable Postop Assessment: no apparent nausea or vomiting Anesthetic complications: no   No notable events documented.   Last Vitals:  Vitals:   02/01/21 1245 02/01/21 1300  BP: 101/62 106/81  Pulse: 66 67  Resp: 12 19  Temp:  (!) 36.2 C  SpO2: 100% 95%    Last Pain:  Vitals:   02/01/21 1300  TempSrc:   PainSc: 0-No pain                 Phill Mutter

## 2021-02-01 NOTE — Anesthesia Preprocedure Evaluation (Signed)
Anesthesia Evaluation  Patient identified by MRN, date of birth, ID band Patient awake    Reviewed: Allergy & Precautions, NPO status , Patient's Chart, lab work & pertinent test results  History of Anesthesia Complications (+) history of anesthetic complications  Airway Mallampati: II  TM Distance: >3 FB Neck ROM: Full    Dental  (+) Partial Upper, Partial Lower   Pulmonary asthma , sleep apnea and Continuous Positive Airway Pressure Ventilation , COPD,  COPD inhaler, former smoker,    Pulmonary exam normal        Cardiovascular hypertension, Pt. on medications negative cardio ROS Normal cardiovascular exam     Neuro/Psych negative neurological ROS  negative psych ROS   GI/Hepatic Neg liver ROS, GERD  Medicated and Controlled,  Endo/Other  negative endocrine ROSdiabetes, Well Controlled, Oral Hypoglycemic Agents  Renal/GU negative Renal ROS  negative genitourinary   Musculoskeletal  (+) Arthritis , Osteoarthritis,    Abdominal   Peds negative pediatric ROS (+)  Hematology negative hematology ROS (+) Sickle cell trait ,   Anesthesia Other Findings Asthma    Diabetes (HCC)    Hyperlipidemia    Hypertension    Sickle cell trait (HCC)    Sleep apnea       Reproductive/Obstetrics negative OB ROS                            Anesthesia Physical Anesthesia Plan  ASA: 3  Anesthesia Plan: General   Post-op Pain Management:    Induction: Intravenous  PONV Risk Score and Plan: 2 and Midazolam  Airway Management Planned: LMA  Additional Equipment:   Intra-op Plan:   Post-operative Plan: Extubation in OR  Informed Consent: I have reviewed the patients History and Physical, chart, labs and discussed the procedure including the risks, benefits and alternatives for the proposed anesthesia with the patient or authorized representative who has indicated his/her understanding and  acceptance.       Plan Discussed with: CRNA, Anesthesiologist and Surgeon  Anesthesia Plan Comments:         Anesthesia Quick Evaluation

## 2021-02-01 NOTE — H&P (Signed)
Paper H&P to be scanned into permanent record. H&P reviewed. No significant changes noted.  

## 2021-02-02 ENCOUNTER — Encounter: Payer: Self-pay | Admitting: Orthopedic Surgery

## 2021-02-04 DIAGNOSIS — Z9889 Other specified postprocedural states: Secondary | ICD-10-CM | POA: Diagnosis not present

## 2021-02-07 DIAGNOSIS — Z9889 Other specified postprocedural states: Secondary | ICD-10-CM | POA: Diagnosis not present

## 2021-02-09 ENCOUNTER — Ambulatory Visit: Payer: PPO | Admitting: Internal Medicine

## 2021-02-09 DIAGNOSIS — Z9889 Other specified postprocedural states: Secondary | ICD-10-CM | POA: Diagnosis not present

## 2021-02-12 ENCOUNTER — Other Ambulatory Visit: Payer: Self-pay | Admitting: Internal Medicine

## 2021-02-16 DIAGNOSIS — Z9889 Other specified postprocedural states: Secondary | ICD-10-CM | POA: Diagnosis not present

## 2021-02-21 DIAGNOSIS — R269 Unspecified abnormalities of gait and mobility: Secondary | ICD-10-CM | POA: Diagnosis not present

## 2021-02-21 DIAGNOSIS — Z9889 Other specified postprocedural states: Secondary | ICD-10-CM | POA: Diagnosis not present

## 2021-02-21 DIAGNOSIS — M25562 Pain in left knee: Secondary | ICD-10-CM | POA: Diagnosis not present

## 2021-02-21 DIAGNOSIS — M25662 Stiffness of left knee, not elsewhere classified: Secondary | ICD-10-CM | POA: Diagnosis not present

## 2021-02-23 ENCOUNTER — Ambulatory Visit: Payer: PPO | Admitting: Nurse Practitioner

## 2021-02-23 DIAGNOSIS — Z9889 Other specified postprocedural states: Secondary | ICD-10-CM | POA: Diagnosis not present

## 2021-02-23 DIAGNOSIS — M25562 Pain in left knee: Secondary | ICD-10-CM | POA: Diagnosis not present

## 2021-02-23 DIAGNOSIS — E119 Type 2 diabetes mellitus without complications: Secondary | ICD-10-CM | POA: Diagnosis not present

## 2021-02-23 DIAGNOSIS — R29898 Other symptoms and signs involving the musculoskeletal system: Secondary | ICD-10-CM | POA: Diagnosis not present

## 2021-02-23 DIAGNOSIS — M25662 Stiffness of left knee, not elsewhere classified: Secondary | ICD-10-CM | POA: Diagnosis not present

## 2021-02-28 DIAGNOSIS — M25562 Pain in left knee: Secondary | ICD-10-CM | POA: Diagnosis not present

## 2021-03-02 DIAGNOSIS — M25562 Pain in left knee: Secondary | ICD-10-CM | POA: Diagnosis not present

## 2021-03-07 ENCOUNTER — Telehealth: Payer: Self-pay

## 2021-03-07 DIAGNOSIS — M25562 Pain in left knee: Secondary | ICD-10-CM | POA: Diagnosis not present

## 2021-03-07 NOTE — Telephone Encounter (Signed)
Left vm for patient to return my call regarding 03/23/21 PFT-Toni

## 2021-03-09 ENCOUNTER — Ambulatory Visit: Payer: PPO

## 2021-03-11 DIAGNOSIS — R6889 Other general symptoms and signs: Secondary | ICD-10-CM | POA: Diagnosis not present

## 2021-03-16 ENCOUNTER — Ambulatory Visit: Payer: PPO

## 2021-03-23 ENCOUNTER — Other Ambulatory Visit: Payer: Self-pay

## 2021-03-23 ENCOUNTER — Ambulatory Visit (INDEPENDENT_AMBULATORY_CARE_PROVIDER_SITE_OTHER): Payer: PPO | Admitting: Internal Medicine

## 2021-03-23 DIAGNOSIS — J4520 Mild intermittent asthma, uncomplicated: Secondary | ICD-10-CM

## 2021-03-23 LAB — PULMONARY FUNCTION TEST

## 2021-04-05 NOTE — Procedures (Signed)
[facility] [place], [address]    Complete Pulmonary Function Testing Interpretation:  FINDINGS:  Forced vital capacity is normal FEV1 is normal.  F1 FVC ratio is normal.  Total lung capacity is mildly decreased residual volume is decreased residual in total capacity ratio is decreased.  FRC is decreased.  DLCO is within normal limits.  Postbronchodilator no significant change in FEV1  IMPRESSION:  This pulmonary function study is suggestive of mild restrictive lung disease  [name], MD [facility] Pulmonary Critical Care Medicine Sleep Medicine

## 2021-04-20 ENCOUNTER — Telehealth: Payer: Self-pay

## 2021-04-20 ENCOUNTER — Other Ambulatory Visit: Payer: Self-pay

## 2021-04-20 ENCOUNTER — Ambulatory Visit (INDEPENDENT_AMBULATORY_CARE_PROVIDER_SITE_OTHER): Payer: PPO

## 2021-04-20 DIAGNOSIS — G4733 Obstructive sleep apnea (adult) (pediatric): Secondary | ICD-10-CM

## 2021-04-20 NOTE — Progress Notes (Signed)
95 percentile pressure 9   95th percentile leak 3.8   apnea index 0.7 /hr  apnea-hypopnea index  1.0 /hr   total days used  >4 hr 79 days  total days used <4 hr 7 days  Total compliance 79 percent   He is doing good he did have to increase himidifier. No other problems or questions at this time

## 2021-04-20 NOTE — Telephone Encounter (Signed)
Pt was here to see kelly today pt like to restart his allergy shot advised him to make appt with DSK to restart shot

## 2021-04-26 ENCOUNTER — Ambulatory Visit: Payer: PPO | Admitting: Nurse Practitioner

## 2021-05-05 ENCOUNTER — Telehealth: Payer: Self-pay

## 2021-05-05 ENCOUNTER — Encounter: Payer: Self-pay | Admitting: Internal Medicine

## 2021-05-05 ENCOUNTER — Other Ambulatory Visit: Payer: Self-pay

## 2021-05-05 ENCOUNTER — Ambulatory Visit: Payer: PPO | Admitting: Internal Medicine

## 2021-05-05 ENCOUNTER — Other Ambulatory Visit: Payer: Self-pay | Admitting: Internal Medicine

## 2021-05-05 VITALS — BP 112/82 | HR 80 | Temp 98.3°F | Resp 16 | Ht 68.0 in | Wt 244.0 lb

## 2021-05-05 DIAGNOSIS — Z7189 Other specified counseling: Secondary | ICD-10-CM

## 2021-05-05 DIAGNOSIS — F1029 Alcohol dependence with unspecified alcohol-induced disorder: Secondary | ICD-10-CM | POA: Diagnosis not present

## 2021-05-05 DIAGNOSIS — J4520 Mild intermittent asthma, uncomplicated: Secondary | ICD-10-CM

## 2021-05-05 DIAGNOSIS — G4733 Obstructive sleep apnea (adult) (pediatric): Secondary | ICD-10-CM

## 2021-05-05 DIAGNOSIS — J301 Allergic rhinitis due to pollen: Secondary | ICD-10-CM

## 2021-05-05 MED ORDER — STIOLTO RESPIMAT 2.5-2.5 MCG/ACT IN AERS: 2.0000 | INHALATION_SPRAY | Freq: Every day | RESPIRATORY_TRACT | 4 refills | Status: DC

## 2021-05-05 NOTE — Patient Instructions (Signed)

## 2021-05-05 NOTE — Progress Notes (Signed)
[facility] [place], [place] [address]  Pulmonary Sleep Medicine   Office Visit Note  Patient Name: Charles Mata DOB: [birth year] MRN [id number]  Date of Service: 05/05/2021  Complaints/HPI: OSA COPD Obesity ETOH.  Patient has multiple issues as far as his sleep apnea is concerned encouraged him to be compliant with his CPAP therapy.  Patient has always had issues with noncompliance with recommended therapies.  He states he is trying to do his best.  As far as his alcohol is concerned patient does continue to drink he is also gained quite a bit of weight since he is gone back to drinking.  I did have a chat with him regarding the fact that excessive drinking can also worsen his sleep disordered breathing and also causes him to gain weight which can also worsen his sleep disordered breathing.  COPD appears to be under control patient has been using his medications as prescribed  ROS  General: (-) fever, (-) chills, (-) night sweats, (-) weakness Skin: (-) rashes, (-) itching,. Eyes: (-) visual changes, (-) redness, (-) itching. Nose and Sinuses: (-) nasal stuffiness or itchiness, (-) postnasal drip, (-) nosebleeds, (-) sinus trouble. Mouth and Throat: (-) sore throat, (-) hoarseness. Neck: (-) swollen glands, (-) enlarged thyroid, (-) neck pain. Respiratory: - cough, (-) bloody sputum, + shortness of breath, - wheezing. Cardiovascular: - ankle swelling, (-) chest pain. Lymphatic: (-) lymph node enlargement. Neurologic: (-) numbness, (-) tingling. Psychiatric: (-) anxiety, (-) depression   Current Medication: Outpatient Encounter Medications as of 05/05/2021  Medication Sig   acetaminophen (TYLENOL) 500 MG tablet Take 2 tablets (1,000 mg total) by mouth every 8 (eight) hours.   acidophilus (RISAQUAD) CAPS capsule Take 1 capsule by mouth daily.   albuterol (PROVENTIL) (2.5 MG/3ML) 0.083% nebulizer solution Take 3 mLs (2.5 mg total) by nebulization every 6  (six) hours as needed for wheezing or shortness of breath.   Apoaequorin (PREVAGEN PO) Take 1 capsule by mouth daily.   [employer] VITAMIN B12 1000 MCG tablet Take 1,000 mcg by mouth daily.   EPINEPHrine (EPIPEN 2-PAK) 0.3 mg/0.3 mL IJ SOAJ injection Inject 0.3 mLs (0.3 mg total) into the muscle as needed for anaphylaxis.   glipiZIDE (GLUCOTROL) 10 MG tablet Take 10 mg by mouth 2 (two) times daily before a meal.   glucose blood test strip    HYDROcodone-acetaminophen (NORCO) 5-325 MG tablet Take 1-2 tablets by mouth every 4 (four) hours as needed for moderate pain or severe pain.   Ipratropium-Albuterol (COMBIVENT) 20-100 MCG/ACT AERS respimat    ipratropium-albuterol (DUONEB) 0.5-2.5 (3) MG/3ML SOLN Take 3 mLs by nebulization every 4 (four) hours as needed.   linaclotide (LINZESS) 72 MCG capsule Take 72 mcg by mouth daily as needed (constipation).   lisinopril (PRINIVIL,ZESTRIL) 5 MG tablet Take 5 mg by mouth daily.   Misc. Devices MISC by Does not apply route. cpap   montelukast (SINGULAIR) 10 MG tablet TAKE 1 TABLET BY MOUTH EVERY DAY   omeprazole (PRILOSEC) 40 MG capsule Take 40 mg by mouth daily.   ondansetron (ZOFRAN ODT) 4 MG disintegrating tablet Take 1 tablet (4 mg total) by mouth every 8 (eight) hours as needed for nausea or vomiting.   rosuvastatin (CRESTOR) 10 MG tablet Take 5 mg by mouth daily.   tamsulosin (FLOMAX) 0.4 MG CAPS capsule Take 0.4 mg by mouth daily.   Vitamin D, Ergocalciferol, (DRISDOL) 50000 units CAPS capsule Take 50,000 Units by mouth every 7 (seven) days.   [  DISCONTINUED] fluticasone (FLOVENT DISKUS) 50 MCG/BLIST diskus inhaler Inhale 1 puff into the lungs 2 (two) times daily. (Patient not taking: Reported on 05/05/2021)   No facility-administered encounter medications on file as of 05/05/2021.    Surgical History: Past Surgical History:  Procedure Laterality Date   CARDIAC CATHETERIZATION     x 2. all good   COLONOSCOPY     KNEE ARTHROSCOPY WITH MEDIAL MENISECTOMY  Left 02/01/2021   Procedure: Left Arthroscopy partial medial meniscectomy, lateral meniscectomy;  Surgeon: Leim Fabry, MD;  Location: [facility] ORS;  Service: Orthopedics;  Laterality: Left;    Medical History: Past Medical History:  Diagnosis Date   Arthritis    Asthma    BPH (benign prostatic hyperplasia)    Complication of anesthesia    difficulty waking him up after colonoscopy   Diabetes (HCC)    GERD (gastroesophageal reflux disease)    Hyperlipidemia    Hypertension    Sickle cell trait (HCC)    Sleep apnea    uses cpap    Family History: Family History  Problem Relation Age of Onset   Diabetes Mother    [place] disease Father     Social History: Social History   Socioeconomic History   Marital status: Widowed    Spouse name: Not on file   Number of children: Not on file   Years of education: Not on file   Highest education level: Not on file  Occupational History   Occupation: truck driver    Comment: part time  Tobacco Use   Smoking status: Former   Smokeless tobacco: Never  Scientific laboratory technician Use: Never used  Substance and Sexual Activity   Alcohol use: Not Currently    Alcohol/week: 2.0 standard drinks    Types: 2 Glasses of wine per week    Comment: no alcohol for 2 months   Drug use: No   Sexual activity: Not on file  Other Topics Concern   Not on file  Social History Narrative   Patient lives with his daughter.She will bring him to the hospital for surgery.   Minimal stairs to climb.   Feels safe in his home.   Social Determinants of Health   Financial Resource Strain: Not on file  Food Insecurity: Not on file  Transportation Needs: Not on file  Physical Activity: Not on file  Stress: Not on file  Social Connections: Not on file  Intimate Partner Violence: Not on file    Vital Signs: Blood pressure 112/82, pulse 80, temperature 98.3 F (36.8 C), resp. rate 16, height 5\' 8"  (1.727 m), weight 244 lb (110.7 kg), SpO2 98  %.  Examination: General Appearance: The patient is well-developed, well-nourished, and in no distress. Skin: Gross inspection of skin unremarkable. Head: normocephalic, no gross deformities. Eyes: no gross deformities noted. ENT: ears appear grossly normal no exudates. Neck: Supple. No thyromegaly. No LAD. Respiratory: no rhonchi noted. Cardiovascular: Normal S1 and S2 without murmur or rub. Extremities: No cyanosis. pulses are equal. Neurologic: Alert and oriented. No involuntary movements.  LABS: Recent Results (from the past 2160 hour(s))  Pulmonary Function Test     Status: None   Collection Time: 03/23/21 11:30 AM  Result Value Ref Range   FEV1     FVC     FEV1/FVC     TLC     DLCO      Radiology: No results found.  No results found.  No results found.    Assessment and Plan:  Patient Active Problem List   Diagnosis Date Noted   Benign paroxysmal positional vertigo 11/30/2016   Chronic midline low back pain without sciatica 10/07/2015   Type 2 diabetes mellitus, controlled ([place]) 11/26/2014   COPD, mild ([place]) 01/29/2014   Sleep apnea 01/29/2014   Hyperlipemia 01/16/2014   Hypertension 01/16/2014    1. Obstructive sleep apnea Once again major issue with him his compliance discussed the importance of CPAP usage and CPAP compliance at length  2. Mild intermittent chronic asthma without complication On medications gave him a prescription today for Stiolto - Tiotropium Bromide-Olodaterol (STIOLTO RESPIMAT) 2.5-2.5 MCG/ACT AERS; Inhale 2 puffs into the lungs daily.  Dispense: 1 each; Refill: 4  3. Seasonal allergic rhinitis due to pollen He wants to go back on his injections for his allergies he states that he was doing better when he was on the - Immunotherapy prescription  4. CPAP use counseling CPAP Counseling: had a lengthy discussion with the patient regarding the importance of PAP therapy in management of the sleep apnea. Patient appears to understand  the risk factor reduction and also understands the risks associated with untreated sleep apnea. Patient will try to make a good faith effort to remain compliant with therapy. Also instructed the patient on proper cleaning of the device including the water must be changed daily if possible and use of distilled water is preferred. Patient understands that the machine should be regularly cleaned with appropriate recommended cleaning solutions that do not damage the PAP machine for example given white vinegar and water rinses. Other methods such as ozone treatment may not be as good as these simple methods to achieve cleaning.   5. Alcohol dependence with unspecified alcohol-induced disorder ([place]) Once again discussed with him the importance of decreasing his alcohol intake he has gained significant weight because once again he is drinking more than he was previously   General Counseling: I have discussed the findings of the evaluation and examination with Annie Main.  I have also discussed any further diagnostic evaluation thatmay be needed or ordered today. Miquel verbalizes understanding of the findings of todays visit. We also reviewed his medications today and discussed drug interactions and side effects including but not limited excessive drowsiness and altered mental states. We also discussed that there is always a risk not just to him but also people around him. he has been encouraged to call the office with any questions or concerns that should arise related to todays visit.  No orders of the defined types were placed in this encounter.    Time spent: 9  I have personally obtained a history, examined the patient, evaluated laboratory and imaging results, formulated the assessment and plan and placed orders.    [name], MD [facility] Pulmonary and Critical Care Sleep medicine

## 2021-05-06 NOTE — Telephone Encounter (Signed)
Send message to tat

## 2021-05-06 NOTE — Telephone Encounter (Signed)
Can you please take care

## 2021-05-10 ENCOUNTER — Telehealth: Payer: Self-pay

## 2021-05-10 NOTE — Telephone Encounter (Signed)
PA for STIOLTO RESPIMAT INHAL SPRAY 2.5-2.5 mcgwas sent @ 11:58pm on 05/09/21

## 2021-05-10 NOTE — Telephone Encounter (Signed)
PA for STIOLTO was approved 05/10/21 to 08/13/21.  Sent new rx to pharmacy

## 2021-05-16 ENCOUNTER — Telehealth: Payer: Self-pay

## 2021-05-16 NOTE — Telephone Encounter (Signed)
Patient's benefits has been checked as of 05/16/21. 08/14/20-active/ 20% coins/no ded/3450 oop/1208.72 met/no auth/ref# [id number]  [id number]- estimated resp $30.00 95117- $1.79 per injection 95115-$1.79 per injection

## 2021-05-25 ENCOUNTER — Other Ambulatory Visit: Payer: Self-pay

## 2021-05-25 ENCOUNTER — Ambulatory Visit: Payer: PPO

## 2021-05-26 ENCOUNTER — Ambulatory Visit: Payer: PPO

## 2021-05-26 ENCOUNTER — Other Ambulatory Visit: Payer: Self-pay

## 2021-05-26 DIAGNOSIS — J301 Allergic rhinitis due to pollen: Secondary | ICD-10-CM | POA: Diagnosis not present

## 2021-05-26 MED ORDER — EPINEPHRINE 0.3 MG/0.3ML IJ SOAJ: 0.3000 mg | INTRAMUSCULAR | 3 refills | Status: AC | PRN

## 2021-05-30 DIAGNOSIS — J449 Chronic obstructive pulmonary disease, unspecified: Secondary | ICD-10-CM | POA: Diagnosis not present

## 2021-05-30 DIAGNOSIS — H8110 Benign paroxysmal vertigo, unspecified ear: Secondary | ICD-10-CM | POA: Diagnosis not present

## 2021-05-30 DIAGNOSIS — E7800 Pure hypercholesterolemia, unspecified: Secondary | ICD-10-CM | POA: Diagnosis not present

## 2021-05-30 DIAGNOSIS — E119 Type 2 diabetes mellitus without complications: Secondary | ICD-10-CM | POA: Diagnosis not present

## 2021-05-30 DIAGNOSIS — I10 Essential (primary) hypertension: Secondary | ICD-10-CM | POA: Diagnosis not present

## 2021-05-30 DIAGNOSIS — G4733 Obstructive sleep apnea (adult) (pediatric): Secondary | ICD-10-CM | POA: Diagnosis not present

## 2021-06-07 DIAGNOSIS — E1165 Type 2 diabetes mellitus with hyperglycemia: Secondary | ICD-10-CM | POA: Diagnosis not present

## 2021-06-07 DIAGNOSIS — K219 Gastro-esophageal reflux disease without esophagitis: Secondary | ICD-10-CM | POA: Diagnosis not present

## 2021-06-07 DIAGNOSIS — Z125 Encounter for screening for malignant neoplasm of prostate: Secondary | ICD-10-CM | POA: Diagnosis not present

## 2021-06-07 DIAGNOSIS — E7800 Pure hypercholesterolemia, unspecified: Secondary | ICD-10-CM | POA: Diagnosis not present

## 2021-06-07 DIAGNOSIS — K449 Diaphragmatic hernia without obstruction or gangrene: Secondary | ICD-10-CM | POA: Diagnosis not present

## 2021-06-07 DIAGNOSIS — I10 Essential (primary) hypertension: Secondary | ICD-10-CM | POA: Diagnosis not present

## 2021-06-07 DIAGNOSIS — J449 Chronic obstructive pulmonary disease, unspecified: Secondary | ICD-10-CM | POA: Diagnosis not present

## 2021-06-07 DIAGNOSIS — K760 Fatty (change of) liver, not elsewhere classified: Secondary | ICD-10-CM | POA: Diagnosis not present

## 2021-06-08 ENCOUNTER — Other Ambulatory Visit: Payer: Self-pay

## 2021-06-08 ENCOUNTER — Ambulatory Visit: Payer: PPO

## 2021-06-08 DIAGNOSIS — J301 Allergic rhinitis due to pollen: Secondary | ICD-10-CM | POA: Diagnosis not present

## 2021-06-14 DIAGNOSIS — Z6837 Body mass index (BMI) 37.0-37.9, adult: Secondary | ICD-10-CM | POA: Diagnosis not present

## 2021-06-14 DIAGNOSIS — Z23 Encounter for immunization: Secondary | ICD-10-CM | POA: Diagnosis not present

## 2021-06-14 DIAGNOSIS — Z0000 Encounter for general adult medical examination without abnormal findings: Secondary | ICD-10-CM | POA: Diagnosis not present

## 2021-06-14 DIAGNOSIS — E119 Type 2 diabetes mellitus without complications: Secondary | ICD-10-CM | POA: Diagnosis not present

## 2021-06-14 DIAGNOSIS — I10 Essential (primary) hypertension: Secondary | ICD-10-CM | POA: Diagnosis not present

## 2021-06-14 DIAGNOSIS — J4520 Mild intermittent asthma, uncomplicated: Secondary | ICD-10-CM | POA: Diagnosis not present

## 2021-06-22 ENCOUNTER — Other Ambulatory Visit: Payer: Self-pay

## 2021-06-22 ENCOUNTER — Ambulatory Visit: Payer: PPO

## 2021-06-22 DIAGNOSIS — J301 Allergic rhinitis due to pollen: Secondary | ICD-10-CM

## 2021-07-06 ENCOUNTER — Ambulatory Visit: Payer: PPO

## 2021-07-06 ENCOUNTER — Other Ambulatory Visit: Payer: Self-pay

## 2021-07-06 DIAGNOSIS — J301 Allergic rhinitis due to pollen: Secondary | ICD-10-CM

## 2021-07-20 ENCOUNTER — Ambulatory Visit: Payer: PPO

## 2021-07-20 ENCOUNTER — Other Ambulatory Visit: Payer: Self-pay

## 2021-07-20 DIAGNOSIS — J301 Allergic rhinitis due to pollen: Secondary | ICD-10-CM | POA: Diagnosis not present

## 2021-07-30 IMAGING — CT CT CHEST W/ CM
2 of 3 series · 15 of 36 positions shown, 18 images · IV contrast (omnipaque)
Comparison: 11/24/2011

CLINICAL DATA: Increased dyspnea on exertion for 1 month, history
of asthma, former smoker

EXAM:
CT CHEST WITH CONTRAST
TECHNIQUE: Multidetector CT imaging of the chest was performed during
intravenous contrast administration.
CONTRAST:  75mL OMNIPAQUE IOHEXOL 300 MG/ML  SOLN

[Series 2: axial st · axial · 0.68mm/px · z∈[-317,-73]mm · 12 of 144 slices shown, 15 images]
[im 11/144  mediastinal]
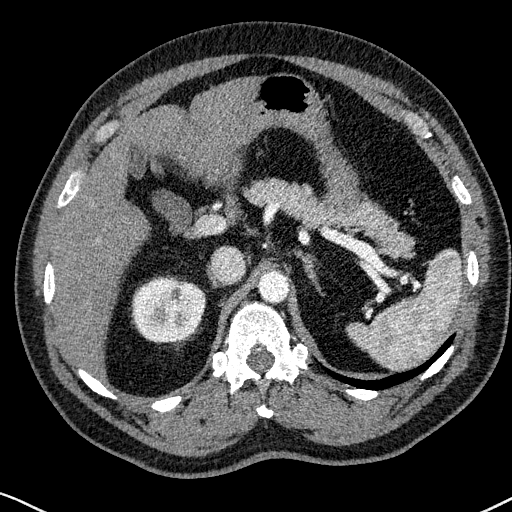
[im 11/144  lung]
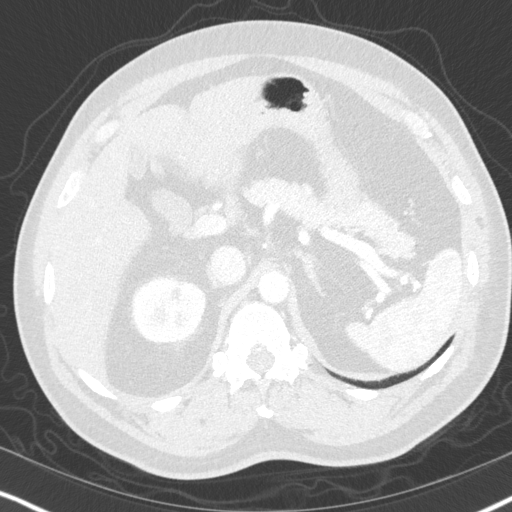
[im 22/144  lung]
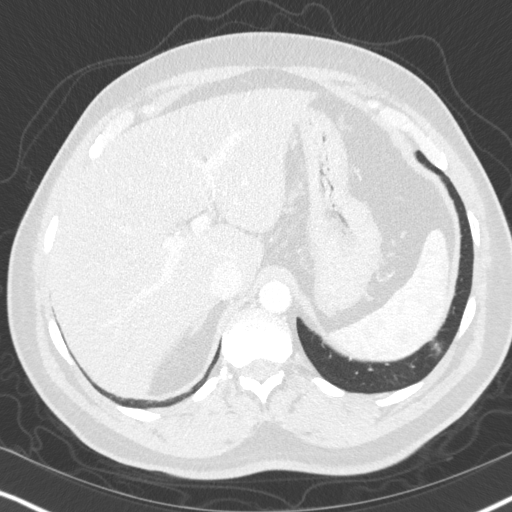
[im 32/144  lung]
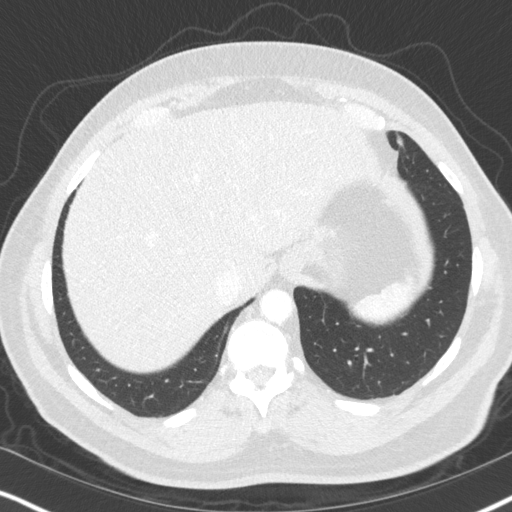
[im 43/144  lung]
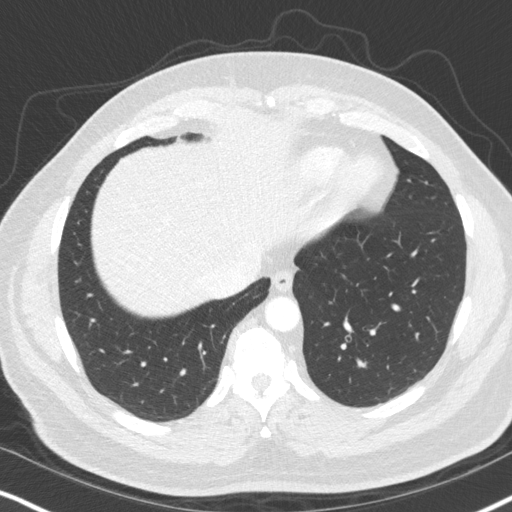
[im 53/144  mediastinal]
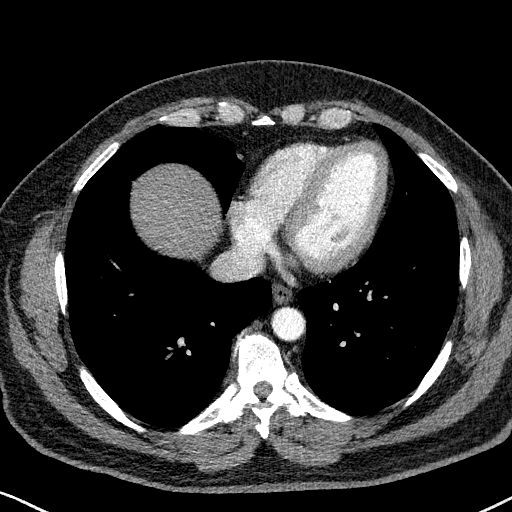
[im 53/144  lung]
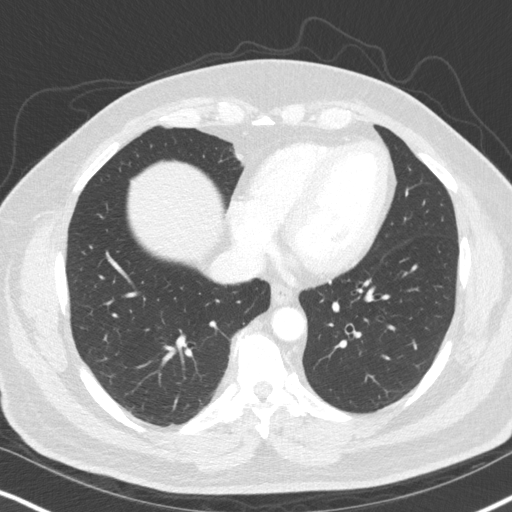
[im 64/144  lung]
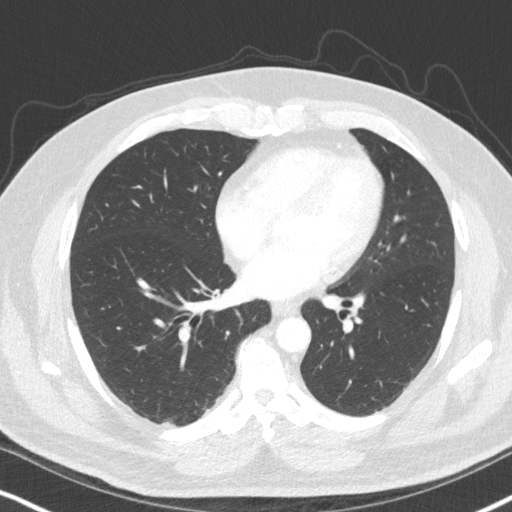
[im 80/144  lung]
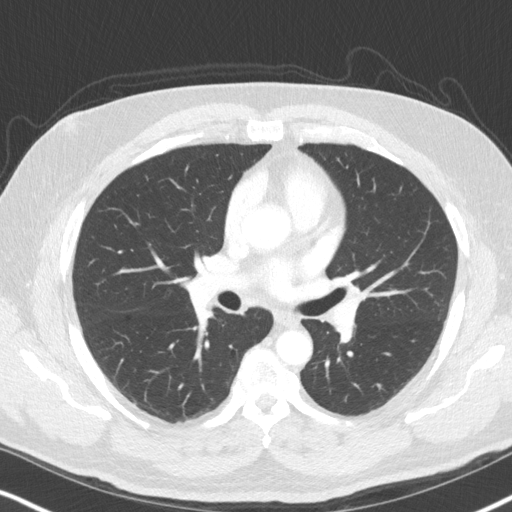
[im 91/144  lung]
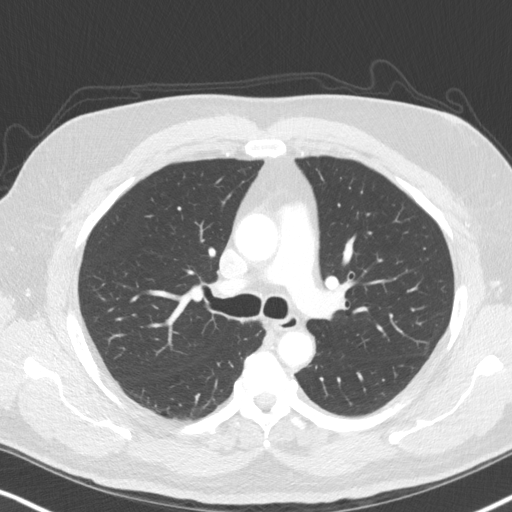
[im 101/144  mediastinal]
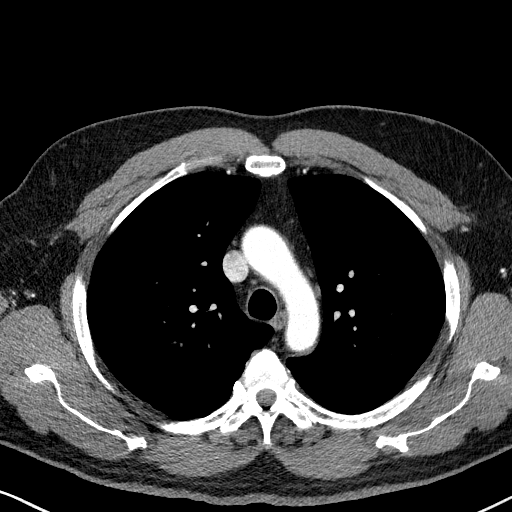
[im 101/144  lung]
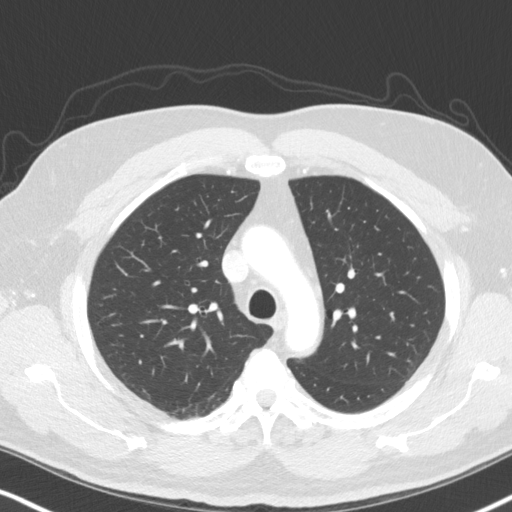
[im 112/144  lung]
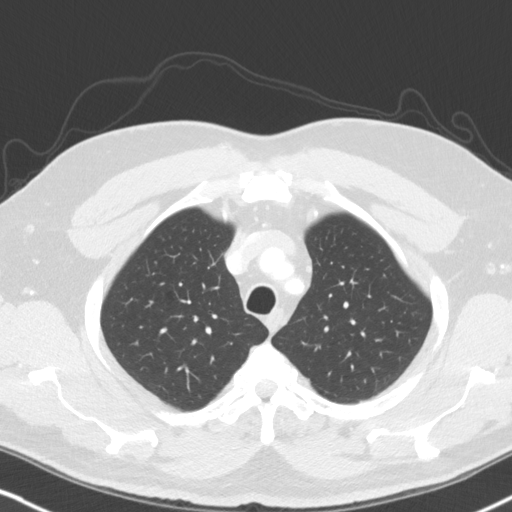
[im 122/144  lung]
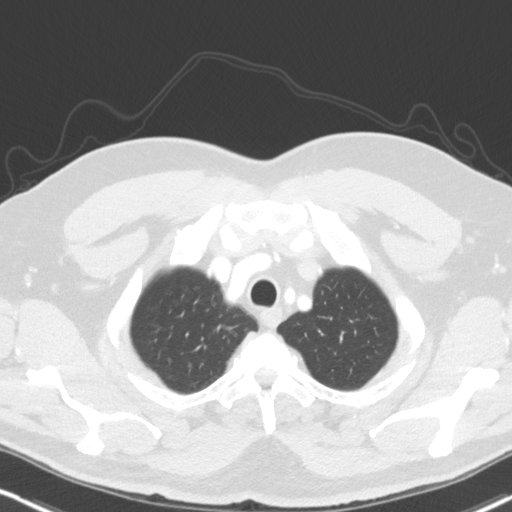
[im 133/144  lung]
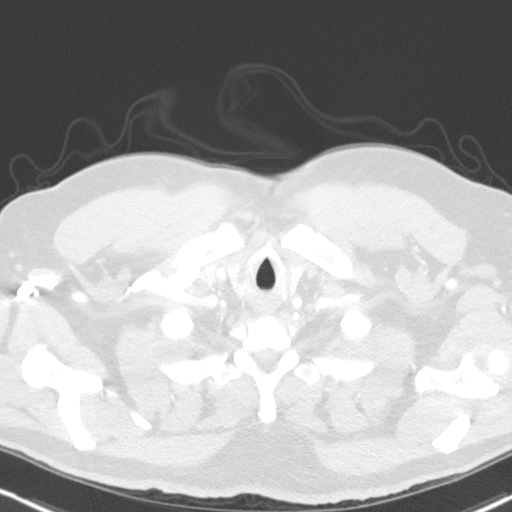

[Series 5: coronal · coronal · 0.59mm/px · 3 of 168 slices shown]
[im 34/168  lung]
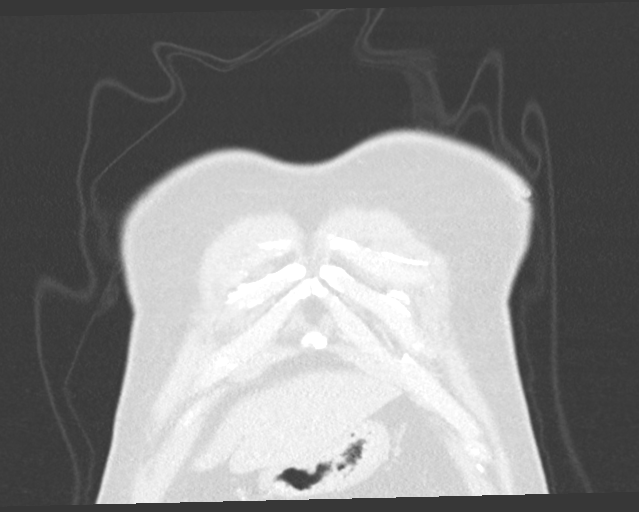
[im 67/168  lung]
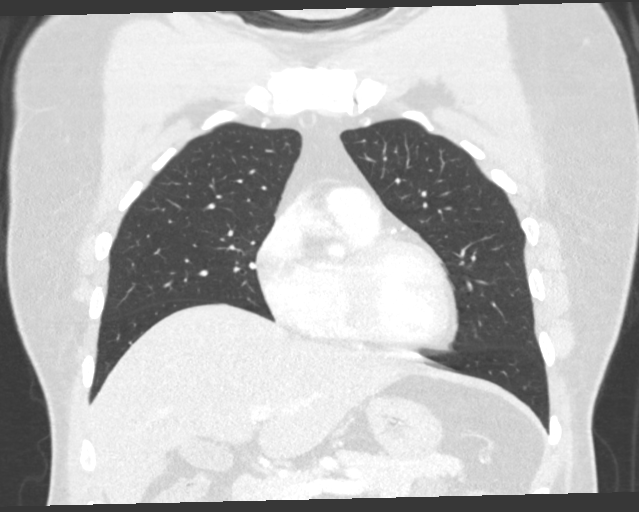
[im 101/168  lung]
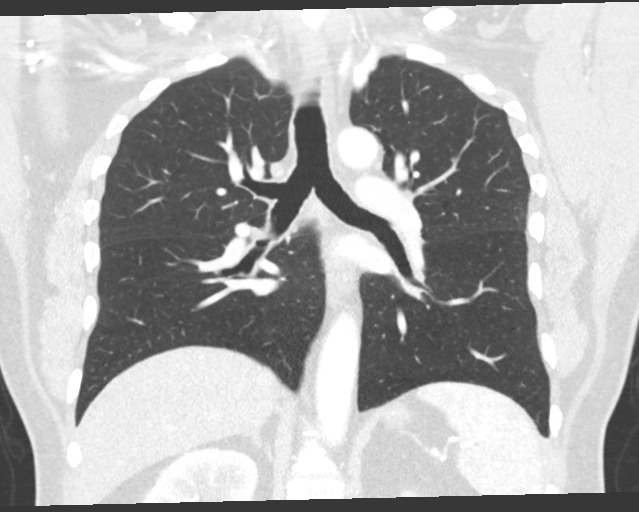

[15 of 36 positions shown; findings below may reference images not displayed]

FINDINGS: Cardiovascular: No significant vascular findings. Normal heart size.
No pericardial effusion.

Mediastinum/Nodes: No enlarged mediastinal, hilar, or axillary lymph
nodes. Thyroid gland, trachea, and esophagus demonstrate no
significant findings.

Lungs/Pleura: Background of very fine centrilobular pulmonary
nodules. No pleural effusion or pneumothorax.

Upper Abdomen: No acute abnormality.  Hepatic steatosis.

Musculoskeletal: No chest wall mass or suspicious bone lesions
identified.
IMPRESSION: 1. Background of very fine centrilobular pulmonary nodules,
consistent with smoking-related respiratory bronchiolitis. No other
abnormality of the lungs.
2. Hepatic steatosis.

## 2021-08-03 ENCOUNTER — Ambulatory Visit: Payer: PPO

## 2021-08-03 ENCOUNTER — Other Ambulatory Visit: Payer: Self-pay

## 2021-08-03 DIAGNOSIS — J301 Allergic rhinitis due to pollen: Secondary | ICD-10-CM

## 2021-08-17 ENCOUNTER — Other Ambulatory Visit: Payer: Self-pay

## 2021-08-17 ENCOUNTER — Ambulatory Visit: Payer: PPO

## 2021-08-17 DIAGNOSIS — J301 Allergic rhinitis due to pollen: Secondary | ICD-10-CM | POA: Diagnosis not present

## 2021-08-31 ENCOUNTER — Ambulatory Visit: Payer: PPO

## 2021-09-07 ENCOUNTER — Ambulatory Visit: Payer: PPO

## 2021-09-07 ENCOUNTER — Other Ambulatory Visit: Payer: Self-pay

## 2021-09-07 DIAGNOSIS — J301 Allergic rhinitis due to pollen: Secondary | ICD-10-CM

## 2021-09-14 ENCOUNTER — Ambulatory Visit: Payer: PPO

## 2021-09-21 ENCOUNTER — Other Ambulatory Visit: Payer: Self-pay

## 2021-09-21 ENCOUNTER — Ambulatory Visit: Payer: PPO

## 2021-09-21 DIAGNOSIS — J301 Allergic rhinitis due to pollen: Secondary | ICD-10-CM | POA: Diagnosis not present

## 2021-09-28 ENCOUNTER — Ambulatory Visit: Payer: PPO

## 2021-10-05 ENCOUNTER — Telehealth: Payer: Self-pay

## 2021-10-05 ENCOUNTER — Other Ambulatory Visit: Payer: Self-pay

## 2021-10-05 ENCOUNTER — Ambulatory Visit: Payer: PPO

## 2021-10-05 DIAGNOSIS — J301 Allergic rhinitis due to pollen: Secondary | ICD-10-CM | POA: Diagnosis not present

## 2021-10-06 DIAGNOSIS — E119 Type 2 diabetes mellitus without complications: Secondary | ICD-10-CM | POA: Diagnosis not present

## 2021-10-06 DIAGNOSIS — Z6837 Body mass index (BMI) 37.0-37.9, adult: Secondary | ICD-10-CM | POA: Diagnosis not present

## 2021-10-06 DIAGNOSIS — I10 Essential (primary) hypertension: Secondary | ICD-10-CM | POA: Diagnosis not present

## 2021-10-06 DIAGNOSIS — J4520 Mild intermittent asthma, uncomplicated: Secondary | ICD-10-CM | POA: Diagnosis not present

## 2021-10-06 DIAGNOSIS — Z0000 Encounter for general adult medical examination without abnormal findings: Secondary | ICD-10-CM | POA: Diagnosis not present

## 2021-10-11 ENCOUNTER — Telehealth: Payer: Self-pay

## 2021-10-11 NOTE — Telephone Encounter (Signed)
error 

## 2021-10-11 NOTE — Telephone Encounter (Signed)
Patient's benefits has been checked for 95165/95115.  ° °No authorization/20% coins/ no ded/ 3200 oop/ 28.08 met/ 25.00 copay/active coverage until 08/13/22. °Ref# [id number] ° °

## 2021-10-12 ENCOUNTER — Ambulatory Visit: Payer: PPO

## 2021-10-12 ENCOUNTER — Other Ambulatory Visit: Payer: Self-pay

## 2021-10-12 ENCOUNTER — Ambulatory Visit (INDEPENDENT_AMBULATORY_CARE_PROVIDER_SITE_OTHER): Payer: PPO

## 2021-10-12 DIAGNOSIS — G4733 Obstructive sleep apnea (adult) (pediatric): Secondary | ICD-10-CM

## 2021-10-12 NOTE — Progress Notes (Signed)
95 percentile pressure 9 ? ? 95th percentile leak 6.1 ? ? apnea index 0.5 /hr ? apnea-hypopnea index  0.8 /hr ? ? total days used  >4 hr 89 days ? total days used <4 hr 0 days ? ?Total compliance 99 percent ? ?He is doing great. He needs new cpap supplies will need new order and chart note.   ?Pt was seen by Claiborne Billings  RRT/RCP  from [facility]  ?

## 2021-10-13 DIAGNOSIS — G4733 Obstructive sleep apnea (adult) (pediatric): Secondary | ICD-10-CM | POA: Diagnosis not present

## 2021-10-13 DIAGNOSIS — G8929 Other chronic pain: Secondary | ICD-10-CM | POA: Diagnosis not present

## 2021-10-13 DIAGNOSIS — E1165 Type 2 diabetes mellitus with hyperglycemia: Secondary | ICD-10-CM | POA: Diagnosis not present

## 2021-10-13 DIAGNOSIS — Z6837 Body mass index (BMI) 37.0-37.9, adult: Secondary | ICD-10-CM | POA: Diagnosis not present

## 2021-10-13 DIAGNOSIS — M545 Low back pain, unspecified: Secondary | ICD-10-CM | POA: Diagnosis not present

## 2021-10-13 DIAGNOSIS — F1029 Alcohol dependence with unspecified alcohol-induced disorder: Secondary | ICD-10-CM | POA: Diagnosis not present

## 2021-10-13 DIAGNOSIS — J449 Chronic obstructive pulmonary disease, unspecified: Secondary | ICD-10-CM | POA: Diagnosis not present

## 2021-10-13 DIAGNOSIS — R635 Abnormal weight gain: Secondary | ICD-10-CM | POA: Diagnosis not present

## 2021-10-13 DIAGNOSIS — Z0000 Encounter for general adult medical examination without abnormal findings: Secondary | ICD-10-CM | POA: Diagnosis not present

## 2021-10-13 DIAGNOSIS — J4520 Mild intermittent asthma, uncomplicated: Secondary | ICD-10-CM | POA: Diagnosis not present

## 2021-10-13 DIAGNOSIS — I10 Essential (primary) hypertension: Secondary | ICD-10-CM | POA: Diagnosis not present

## 2021-10-19 ENCOUNTER — Ambulatory Visit: Payer: PPO

## 2021-10-19 DIAGNOSIS — J301 Allergic rhinitis due to pollen: Secondary | ICD-10-CM

## 2021-10-19 NOTE — Progress Notes (Signed)
Aeroallergen Immunotherapy  ? ? ?Patient Details  ?Name: Charles Mata ?MRN: [id number] ?Date of Birth: 1951-05-28 ? ?Order 1 of 1 ? ?Vial Label: TGM ? ?0.20 mL of each antigen: [employer], Aspergillus [place], [place], and [place] ? ? ?0.8   mL Extract Subtotal ?4.2   mL Normal Saline Diluent ?5.0  mL Maintenance Total ? ? ?Final Concentration above is stated in weight/volume (wt/vol).  Allergen units (AU/ml) biological units (BAU/ml).  The total volume is 5 ml.   ? ? ? ?Special Instructions: None ? ? ? ?

## 2021-10-20 ENCOUNTER — Ambulatory Visit: Payer: PPO | Admitting: Internal Medicine

## 2021-10-20 ENCOUNTER — Ambulatory Visit: Payer: PPO

## 2021-10-20 ENCOUNTER — Encounter: Payer: Self-pay | Admitting: Internal Medicine

## 2021-10-20 ENCOUNTER — Other Ambulatory Visit: Payer: Self-pay

## 2021-10-20 VITALS — BP 121/63 | HR 76 | Temp 98.4°F | Resp 16 | Ht 68.0 in | Wt 245.4 lb

## 2021-10-20 DIAGNOSIS — J4520 Mild intermittent asthma, uncomplicated: Secondary | ICD-10-CM | POA: Diagnosis not present

## 2021-10-20 DIAGNOSIS — Z7189 Other specified counseling: Secondary | ICD-10-CM

## 2021-10-20 DIAGNOSIS — R0602 Shortness of breath: Secondary | ICD-10-CM | POA: Diagnosis not present

## 2021-10-20 DIAGNOSIS — J301 Allergic rhinitis due to pollen: Secondary | ICD-10-CM | POA: Diagnosis not present

## 2021-10-20 DIAGNOSIS — G4733 Obstructive sleep apnea (adult) (pediatric): Secondary | ICD-10-CM | POA: Diagnosis not present

## 2021-10-20 NOTE — Patient Instructions (Signed)
Sleep Apnea ?Sleep apnea affects breathing during sleep. It causes breathing to stop for 10 seconds or more, or to become shallow. People with sleep apnea usually snore loudly. ?It can also increase the risk of: ?Heart attack. ?Stroke. ?Being very overweight (obese). ?Diabetes. ?Heart failure. ?Irregular heartbeat. ?High blood pressure. ?The goal of treatment is to help you breathe normally again. ?What are the causes? ?The most common cause of this condition is a collapsed or blocked airway. ?There are three kinds of sleep apnea: ?Obstructive sleep apnea. This is caused by a blocked or collapsed airway. ?Central sleep apnea. This happens when the brain does not send the right signals to the muscles that control breathing. ?Mixed sleep apnea. This is a combination of obstructive and central sleep apnea. ?What increases the risk? ?Being overweight. ?Smoking. ?Having a small airway. ?Being older. ?Being male. ?Drinking alcohol. ?Taking medicines to calm yourself (sedatives or tranquilizers). ?Having family members with the condition. ?Having a tongue or tonsils that are larger than normal. ?What are the signs or symptoms? ?Trouble staying asleep. ?Loud snoring. ?Headaches in the morning. ?Waking up gasping. ?Dry mouth or sore throat in the morning. ?Being sleepy or tired during the day. ?If you are sleepy or tired during the day, you may also: ?Not be able to focus your mind (concentrate). ?Forget things. ?Get angry a lot and have mood swings. ?Feel sad (depressed). ?Have changes in your personality. ?Have less interest in sex, if you are male. ?Be unable to have an erection, if you are male. ?How is this treated? ? ?Sleeping on your side. ?Using a medicine to get rid of mucus in your nose (decongestant). ?Avoiding the use of alcohol, medicines to help you relax, or certain pain medicines (narcotics). ?Losing weight, if needed. ?Changing your diet. ?Quitting smoking. ?Using a machine to open your airway while you  sleep, such as: ?An oral appliance. This is a mouthpiece that shifts your lower jaw forward. ?A CPAP device. This device blows air through a mask when you breathe out (exhale). ?An EPAP device. This has valves that you put in each nostril. ?A BIPAP device. This device blows air through a mask when you breathe in (inhale) and breathe out. ?Having surgery if other treatments do not work. ?Follow these instructions at home: ?Lifestyle ?Make changes that your doctor recommends. ?Eat a healthy diet. ?Lose weight if needed. ?Avoid alcohol, medicines to help you relax, and some pain medicines. ?Do not smoke or use any products that contain nicotine or tobacco. If you need help quitting, ask your doctor. ?General instructions ?Take over-the-counter and prescription medicines only as told by your doctor. ?If you were given a machine to use while you sleep, use it only as told by your doctor. ?If you are having surgery, make sure to tell your doctor you have sleep apnea. You may need to bring your device with you. ?Keep all follow-up visits. ?Contact a doctor if: ?The machine that you were given to use during sleep bothers you or does not seem to be working. ?You do not get better. ?You get worse. ?Get help right away if: ?Your chest hurts. ?You have trouble breathing in enough air. ?You have an uncomfortable feeling in your back, arms, or stomach. ?You have trouble talking. ?One side of your body feels weak. ?A part of your face is hanging down. ?These symptoms may be an emergency. Get help right away. Call your local emergency services (911 in the [place] ?Do not wait to see if the symptoms   will go away. ?Do not drive yourself to the hospital. ?Summary ?This condition affects breathing during sleep. ?The most common cause is a collapsed or blocked airway. ?The goal of treatment is to help you breathe normally while you sleep. ?This information is not intended to replace advice given to you by your health care provider. Make  sure you discuss any questions you have with your health care provider. ?Document Revised: 03/09/2021 Document Reviewed: 07/09/2020 ?Elsevier Patient Education ? 2022 [employer] ? ?

## 2021-10-20 NOTE — Progress Notes (Unsigned)
[facility] [place], [place] [address]  Pulmonary Sleep Medicine   Office Visit Note  Patient Name: Charles Mata DOB: [birth year] MRN [id number]  Date of Service: 10/20/2021  Complaints/HPI: He is here for follow up for his breathing. He states he is at baseline does have some increase in symptoms with the pollen. Also states he has some GI issues with constipation. Patient is on linzess and also on stool softeners. Patient has had scope done in past but states he will follow with his GI. CPAP compliance was good and he states he does get good benefit from using the PAP therapy  ROS  General: (-) fever, (-) chills, (-) night sweats, (-) weakness Skin: (-) rashes, (-) itching,. Eyes: (-) visual changes, (-) redness, (-) itching. Nose and Sinuses: (-) nasal stuffiness or itchiness, (-) postnasal drip, (-) nosebleeds, (-) sinus trouble. Mouth and Throat: (-) sore throat, (-) hoarseness. Neck: (-) swollen glands, (-) enlarged thyroid, (-) neck pain. Respiratory: - cough, (-) bloody sputum, + shortness of breath, - wheezing. Cardiovascular: - ankle swelling, (-) chest pain. Lymphatic: (-) lymph node enlargement. Neurologic: (-) numbness, (-) tingling. Psychiatric: (-) anxiety, (-) depression   Current Medication: Outpatient Encounter Medications as of 10/20/2021  Medication Sig   acetaminophen (TYLENOL) 500 MG tablet Take 2 tablets (1,000 mg total) by mouth every 8 (eight) hours.   acidophilus (RISAQUAD) CAPS capsule Take 1 capsule by mouth daily.   albuterol (PROVENTIL) (2.5 MG/3ML) 0.083% nebulizer solution Take 3 mLs (2.5 mg total) by nebulization every 6 (six) hours as needed for wheezing or shortness of breath.   Apoaequorin (PREVAGEN PO) Take 1 capsule by mouth daily.   [employer]S VITAMIN B12 1000 MCG tablet Take 1,000 mcg by mouth daily.   EPINEPHrine (EPIPEN 2-PAK) 0.3 mg/0.3 mL IJ SOAJ injection Inject 0.3 mg into the muscle as needed for anaphylaxis.    glipiZIDE (GLUCOTROL) 10 MG tablet Take 10 mg by mouth 2 (two) times daily before a meal.   glucose blood test strip    HYDROcodone-acetaminophen (NORCO) 5-325 MG tablet Take 1-2 tablets by mouth every 4 (four) hours as needed for moderate pain or severe pain.   Ipratropium-Albuterol (COMBIVENT) 20-100 MCG/ACT AERS respimat    ipratropium-albuterol (DUONEB) 0.5-2.5 (3) MG/3ML SOLN Take 3 mLs by nebulization every 4 (four) hours as needed.   linaclotide (LINZESS) 72 MCG capsule Take 72 mcg by mouth daily as needed (constipation).   lisinopril (PRINIVIL,ZESTRIL) 5 MG tablet Take 5 mg by mouth daily.   Misc. Devices MISC by Does not apply route. cpap   montelukast (SINGULAIR) 10 MG tablet TAKE 1 TABLET BY MOUTH EVERY DAY   omeprazole (PRILOSEC) 40 MG capsule Take 40 mg by mouth daily.   ondansetron (ZOFRAN ODT) 4 MG disintegrating tablet Take 1 tablet (4 mg total) by mouth every 8 (eight) hours as needed for nausea or vomiting.   OZEMPIC, 0.25 OR 0.5 MG/DOSE, 2 MG/1.5ML SOPN Inject into the skin.   rosuvastatin (CRESTOR) 10 MG tablet Take 5 mg by mouth daily.   STIOLTO RESPIMAT 2.5-2.5 MCG/ACT AERS INHALE 2 PUFFS BY MOUTH INTO THE LUNGS DAILY   tamsulosin (FLOMAX) 0.4 MG CAPS capsule Take 0.4 mg by mouth daily.   Vitamin D, Ergocalciferol, (DRISDOL) 50000 units CAPS capsule Take 50,000 Units by mouth every 7 (seven) days.   No facility-administered encounter medications on file as of 10/20/2021.    Surgical History: Past Surgical History:  Procedure Laterality Date   CARDIAC CATHETERIZATION  x 2. all good   COLONOSCOPY     KNEE ARTHROSCOPY WITH MEDIAL MENISECTOMY Left 02/01/2021   Procedure: Left Arthroscopy partial medial meniscectomy, lateral meniscectomy;  Surgeon: Leim Fabry, MD;  Location: [facility] ORS;  Service: Orthopedics;  Laterality: Left;    Medical History: Past Medical History:  Diagnosis Date   Arthritis    Asthma    BPH (benign prostatic hyperplasia)    Complication of  anesthesia    difficulty waking him up after colonoscopy   Diabetes (HCC)    GERD (gastroesophageal reflux disease)    Hyperlipidemia    Hypertension    Sickle cell trait (HCC)    Sleep apnea    uses cpap    Family History: Family History  Problem Relation Age of Onset   Diabetes Mother    Moyamoya disease Father     Social History: Social History   Socioeconomic History   Marital status: Widowed    Spouse name: Not on file   Number of children: Not on file   Years of education: Not on file   Highest education level: Not on file  Occupational History   Occupation: truck driver    Comment: part time  Tobacco Use   Smoking status: Former   Smokeless tobacco: Never  Vaping Use   Vaping Use: Never used  Substance and Sexual Activity   Alcohol use: Yes    Alcohol/week: 2.0 standard drinks    Types: 2 Glasses of wine per week    Comment: no alcohol for 2 months   Drug use: No   Sexual activity: Not on file  Other Topics Concern   Not on file  Social History Narrative   Patient lives with his daughter.She will bring him to the hospital for surgery.   Minimal stairs to climb.   Feels safe in his home.   Social Determinants of Health   Financial Resource Strain: Not on file  Food Insecurity: Not on file  Transportation Needs: Not on file  Physical Activity: Not on file  Stress: Not on file  Social Connections: Not on file  Intimate Partner Violence: Not on file    Vital Signs: Blood pressure 121/63, pulse 76, temperature 98.4 F (36.9 C), resp. rate 16, height '5\' 8"'$  (1.727 m), weight 245 lb 6.4 oz (111.3 kg), SpO2 98 %.  Examination: General Appearance: The patient is well-developed, well-nourished, and in no distress. Skin: Gross inspection of skin unremarkable. Head: normocephalic, no gross deformities. Eyes: no gross deformities noted. ENT: ears appear grossly normal no exudates. Neck: Supple. No thyromegaly. No LAD. Respiratory: no rhonchi noted at  this time. Cardiovascular: Normal S1 and S2 without murmur or rub. Extremities: No cyanosis. pulses are equal. Neurologic: Alert and oriented. No involuntary movements.  LABS: No results found for this or any previous visit (from the past 2160 hour(s)).  Radiology: No results found.  No results found.  No results found.    Assessment and Plan: Patient Active Problem List   Diagnosis Date Noted   Benign paroxysmal positional vertigo 11/30/2016   Chronic midline low back pain without sciatica 10/07/2015   Type 2 diabetes mellitus, controlled ([place]) 11/26/2014   COPD, mild ([place]) 01/29/2014   Sleep apnea 01/29/2014   Hyperlipemia 01/16/2014   Hypertension 01/16/2014    1. Shortness of breath *** - Spirometry with Graph  2. Obstructive sleep apnea ***  3. CPAP use counseling ***  4. Seasonal allergic rhinitis due to pollen ***  5. Mild intermittent chronic asthma  without complication ***   General Counseling: I have discussed the findings of the evaluation and examination with Annie Main.  I have also discussed any further diagnostic evaluation thatmay be needed or ordered today. Dayvion verbalizes understanding of the findings of todays visit. We also reviewed his medications today and discussed drug interactions and side effects including but not limited excessive drowsiness and altered mental states. We also discussed that there is always a risk not just to him but also people around him. he has been encouraged to call the office with any questions or concerns that should arise related to todays visit.  Orders Placed This Encounter  Procedures   Spirometry with Graph    Order Specific Question:   Where should this test be performed?    Answer:   [facility]    Order Specific Question:   Basic spirometry    Answer:   Yes     Time spent: ***  I have personally obtained a history, examined the patient, evaluated laboratory and imaging results, formulated  the assessment and plan and placed orders.    [name], MD [facility] Pulmonary and Critical Care Sleep medicine

## 2021-10-21 NOTE — Progress Notes (Signed)
Aeroallergen Immunotherapy  ? ? ?Patient Details  ?Name: Charles Mata ?MRN: [id number] ?Date of Birth: 1950-08-23 ? ?Order 1 of 1 ? ?Vial Label: tree, grass, mold ? ?0.20 mL of each antigen: [employer], Aspergillus [place], [place], and [place] ? ? ?5.0   mL Extract Subtotal ?4.2   mL Normal Saline Diluent ?5.0  mL Maintenance Total ? ? ?Final Concentration above is stated in weight/volume (wt/vol).  Allergen units (AU/ml) biological units (BAU/ml).  The total volume is 5 ml.   ? ? ? ?Special Instructions: none ?

## 2021-10-26 ENCOUNTER — Ambulatory Visit: Payer: PPO

## 2021-10-27 ENCOUNTER — Ambulatory Visit: Payer: PPO | Admitting: Internal Medicine

## 2021-11-02 ENCOUNTER — Other Ambulatory Visit: Payer: Self-pay

## 2021-11-02 ENCOUNTER — Ambulatory Visit: Payer: PPO

## 2021-11-02 DIAGNOSIS — J301 Allergic rhinitis due to pollen: Secondary | ICD-10-CM | POA: Insufficient documentation

## 2021-11-16 ENCOUNTER — Ambulatory Visit: Payer: PPO

## 2021-11-16 DIAGNOSIS — J301 Allergic rhinitis due to pollen: Secondary | ICD-10-CM

## 2021-11-28 DIAGNOSIS — J449 Chronic obstructive pulmonary disease, unspecified: Secondary | ICD-10-CM | POA: Diagnosis not present

## 2021-11-28 DIAGNOSIS — I10 Essential (primary) hypertension: Secondary | ICD-10-CM | POA: Diagnosis not present

## 2021-11-28 DIAGNOSIS — G4730 Sleep apnea, unspecified: Secondary | ICD-10-CM | POA: Diagnosis not present

## 2021-11-28 DIAGNOSIS — E782 Mixed hyperlipidemia: Secondary | ICD-10-CM | POA: Diagnosis not present

## 2021-11-30 ENCOUNTER — Ambulatory Visit (INDEPENDENT_AMBULATORY_CARE_PROVIDER_SITE_OTHER): Payer: PPO

## 2021-11-30 DIAGNOSIS — J301 Allergic rhinitis due to pollen: Secondary | ICD-10-CM

## 2021-12-14 ENCOUNTER — Ambulatory Visit: Payer: PPO

## 2021-12-14 DIAGNOSIS — J301 Allergic rhinitis due to pollen: Secondary | ICD-10-CM

## 2021-12-28 ENCOUNTER — Ambulatory Visit: Payer: PPO

## 2021-12-28 DIAGNOSIS — J301 Allergic rhinitis due to pollen: Secondary | ICD-10-CM

## 2022-01-11 ENCOUNTER — Ambulatory Visit: Payer: PPO

## 2022-01-11 DIAGNOSIS — J301 Allergic rhinitis due to pollen: Secondary | ICD-10-CM | POA: Diagnosis not present

## 2022-01-17 DIAGNOSIS — R635 Abnormal weight gain: Secondary | ICD-10-CM | POA: Diagnosis not present

## 2022-01-17 DIAGNOSIS — G8929 Other chronic pain: Secondary | ICD-10-CM | POA: Diagnosis not present

## 2022-01-17 DIAGNOSIS — Z6837 Body mass index (BMI) 37.0-37.9, adult: Secondary | ICD-10-CM | POA: Diagnosis not present

## 2022-01-17 DIAGNOSIS — E1165 Type 2 diabetes mellitus with hyperglycemia: Secondary | ICD-10-CM | POA: Diagnosis not present

## 2022-01-17 DIAGNOSIS — I10 Essential (primary) hypertension: Secondary | ICD-10-CM | POA: Diagnosis not present

## 2022-01-17 DIAGNOSIS — J449 Chronic obstructive pulmonary disease, unspecified: Secondary | ICD-10-CM | POA: Diagnosis not present

## 2022-01-17 DIAGNOSIS — G4733 Obstructive sleep apnea (adult) (pediatric): Secondary | ICD-10-CM | POA: Diagnosis not present

## 2022-01-17 DIAGNOSIS — J4520 Mild intermittent asthma, uncomplicated: Secondary | ICD-10-CM | POA: Diagnosis not present

## 2022-01-17 DIAGNOSIS — M545 Low back pain, unspecified: Secondary | ICD-10-CM | POA: Diagnosis not present

## 2022-01-19 DIAGNOSIS — G8929 Other chronic pain: Secondary | ICD-10-CM | POA: Diagnosis not present

## 2022-01-19 DIAGNOSIS — K449 Diaphragmatic hernia without obstruction or gangrene: Secondary | ICD-10-CM | POA: Diagnosis not present

## 2022-01-19 DIAGNOSIS — R140 Abdominal distension (gaseous): Secondary | ICD-10-CM | POA: Diagnosis not present

## 2022-01-19 DIAGNOSIS — M542 Cervicalgia: Secondary | ICD-10-CM | POA: Diagnosis not present

## 2022-01-19 DIAGNOSIS — M545 Low back pain, unspecified: Secondary | ICD-10-CM | POA: Diagnosis not present

## 2022-01-19 DIAGNOSIS — J4520 Mild intermittent asthma, uncomplicated: Secondary | ICD-10-CM | POA: Diagnosis not present

## 2022-01-19 DIAGNOSIS — I10 Essential (primary) hypertension: Secondary | ICD-10-CM | POA: Diagnosis not present

## 2022-01-19 DIAGNOSIS — K219 Gastro-esophageal reflux disease without esophagitis: Secondary | ICD-10-CM | POA: Diagnosis not present

## 2022-01-19 DIAGNOSIS — Z6836 Body mass index (BMI) 36.0-36.9, adult: Secondary | ICD-10-CM | POA: Diagnosis not present

## 2022-01-19 DIAGNOSIS — E1165 Type 2 diabetes mellitus with hyperglycemia: Secondary | ICD-10-CM | POA: Diagnosis not present

## 2022-01-25 ENCOUNTER — Ambulatory Visit (INDEPENDENT_AMBULATORY_CARE_PROVIDER_SITE_OTHER): Payer: PPO

## 2022-01-25 DIAGNOSIS — J301 Allergic rhinitis due to pollen: Secondary | ICD-10-CM | POA: Diagnosis not present

## 2022-02-08 ENCOUNTER — Ambulatory Visit: Payer: PPO

## 2022-02-08 DIAGNOSIS — J301 Allergic rhinitis due to pollen: Secondary | ICD-10-CM | POA: Diagnosis not present

## 2022-02-15 ENCOUNTER — Ambulatory Visit (INDEPENDENT_AMBULATORY_CARE_PROVIDER_SITE_OTHER): Payer: PPO

## 2022-02-15 ENCOUNTER — Other Ambulatory Visit: Payer: Self-pay | Admitting: Nurse Practitioner

## 2022-02-15 DIAGNOSIS — G4733 Obstructive sleep apnea (adult) (pediatric): Secondary | ICD-10-CM

## 2022-02-15 DIAGNOSIS — Z9989 Dependence on other enabling machines and devices: Secondary | ICD-10-CM

## 2022-02-15 NOTE — Progress Notes (Signed)
95 percentile pressure 9   95th percentile leak 8.8   apnea index 0.5 /hr  apnea-hypopnea index  0.8 /hr   total days used  >4 hr 87 days  total days used <4 hr 1 days  Total compliance 97 percent  He is doing great but needs supplies.    Pt was seen by Claiborne Billings  RRT/RCP  from [facility]

## 2022-02-19 ENCOUNTER — Other Ambulatory Visit: Payer: Self-pay | Admitting: Internal Medicine

## 2022-02-22 ENCOUNTER — Ambulatory Visit: Payer: PPO

## 2022-02-22 DIAGNOSIS — J301 Allergic rhinitis due to pollen: Secondary | ICD-10-CM

## 2022-02-28 ENCOUNTER — Telehealth: Payer: Self-pay

## 2022-02-28 NOTE — Telephone Encounter (Signed)
Spoke to pt and gave 2 samples of STIOLTO RESPIMAT INHALER 2.5-2.5 MCG due to pt having a high copay due to being in the donut hole with insurance

## 2022-03-02 DIAGNOSIS — Z86010 Personal history of colonic polyps: Secondary | ICD-10-CM | POA: Diagnosis not present

## 2022-03-02 DIAGNOSIS — K219 Gastro-esophageal reflux disease without esophagitis: Secondary | ICD-10-CM | POA: Diagnosis not present

## 2022-03-02 DIAGNOSIS — K5900 Constipation, unspecified: Secondary | ICD-10-CM | POA: Diagnosis not present

## 2022-03-02 DIAGNOSIS — R140 Abdominal distension (gaseous): Secondary | ICD-10-CM | POA: Diagnosis not present

## 2022-03-02 DIAGNOSIS — R1310 Dysphagia, unspecified: Secondary | ICD-10-CM | POA: Diagnosis not present

## 2022-03-02 DIAGNOSIS — K760 Fatty (change of) liver, not elsewhere classified: Secondary | ICD-10-CM | POA: Diagnosis not present

## 2022-03-08 ENCOUNTER — Telehealth: Payer: Self-pay

## 2022-03-08 ENCOUNTER — Ambulatory Visit: Payer: PPO

## 2022-03-08 NOTE — Telephone Encounter (Signed)
Lmom to call back regarding we need cancel allergy injection appt

## 2022-03-09 ENCOUNTER — Telehealth: Payer: Self-pay

## 2022-03-09 NOTE — Telephone Encounter (Signed)
Patient's benefits has been checked for allergy inj/serum.  08/14/21-current 20% coins No auth/ no ded/ 3200 oop / 125.22 met/ ref# [id number]  tat

## 2022-03-13 ENCOUNTER — Other Ambulatory Visit: Payer: Self-pay | Admitting: Nurse Practitioner

## 2022-03-13 DIAGNOSIS — K5900 Constipation, unspecified: Secondary | ICD-10-CM

## 2022-03-13 DIAGNOSIS — K760 Fatty (change of) liver, not elsewhere classified: Secondary | ICD-10-CM

## 2022-03-13 DIAGNOSIS — R140 Abdominal distension (gaseous): Secondary | ICD-10-CM

## 2022-03-14 ENCOUNTER — Telehealth: Payer: Self-pay

## 2022-03-14 DIAGNOSIS — J309 Allergic rhinitis, unspecified: Secondary | ICD-10-CM | POA: Diagnosis not present

## 2022-03-14 NOTE — Telephone Encounter (Signed)
Allergy mix done Order 1 of 1   Vial Label: tree, grass, mold   0.20 mL of each antigen: [place], Aspergillus [place], [place], and [place]     5.0   mL Extract Subtotal 4.2   mL Normal Saline Diluent 5.0  mL Maintenance Total     Final Concentration above is stated in weight/volume (wt/vol).  Allergen units (AU/ml) biological units (BAU/ml).  The total volume is 5 ml.

## 2022-03-16 ENCOUNTER — Ambulatory Visit
Admission: RE | Admit: 2022-03-16 | Discharge: 2022-03-16 | Disposition: A | Discharge status: Home / Self Care | Payer: PPO | Source: Ambulatory Visit | Attending: Nurse Practitioner | Admitting: Nurse Practitioner

## 2022-03-16 DIAGNOSIS — K760 Fatty (change of) liver, not elsewhere classified: Secondary | ICD-10-CM | POA: Diagnosis not present

## 2022-03-16 DIAGNOSIS — K7460 Unspecified cirrhosis of liver: Secondary | ICD-10-CM | POA: Diagnosis not present

## 2022-03-16 DIAGNOSIS — K5900 Constipation, unspecified: Secondary | ICD-10-CM | POA: Insufficient documentation

## 2022-03-16 DIAGNOSIS — R140 Abdominal distension (gaseous): Secondary | ICD-10-CM | POA: Insufficient documentation

## 2022-03-22 ENCOUNTER — Ambulatory Visit: Payer: PPO

## 2022-03-22 DIAGNOSIS — J301 Allergic rhinitis due to pollen: Secondary | ICD-10-CM

## 2022-04-04 DIAGNOSIS — G4733 Obstructive sleep apnea (adult) (pediatric): Secondary | ICD-10-CM | POA: Diagnosis not present

## 2022-04-05 ENCOUNTER — Ambulatory Visit (INDEPENDENT_AMBULATORY_CARE_PROVIDER_SITE_OTHER): Payer: PPO

## 2022-04-05 DIAGNOSIS — J301 Allergic rhinitis due to pollen: Secondary | ICD-10-CM | POA: Diagnosis not present

## 2022-04-19 ENCOUNTER — Ambulatory Visit: Payer: PPO

## 2022-04-20 ENCOUNTER — Ambulatory Visit: Payer: PPO

## 2022-04-20 ENCOUNTER — Ambulatory Visit (INDEPENDENT_AMBULATORY_CARE_PROVIDER_SITE_OTHER): Payer: PPO | Admitting: Physician Assistant

## 2022-04-20 ENCOUNTER — Encounter: Payer: Self-pay | Admitting: Physician Assistant

## 2022-04-20 VITALS — BP 127/72 | HR 70 | Temp 98.4°F | Resp 16 | Ht 68.0 in | Wt 245.4 lb

## 2022-04-20 DIAGNOSIS — Z9989 Dependence on other enabling machines and devices: Secondary | ICD-10-CM | POA: Diagnosis not present

## 2022-04-20 DIAGNOSIS — J301 Allergic rhinitis due to pollen: Secondary | ICD-10-CM

## 2022-04-20 DIAGNOSIS — R0602 Shortness of breath: Secondary | ICD-10-CM

## 2022-04-20 DIAGNOSIS — G4733 Obstructive sleep apnea (adult) (pediatric): Secondary | ICD-10-CM | POA: Diagnosis not present

## 2022-04-20 DIAGNOSIS — E669 Obesity, unspecified: Secondary | ICD-10-CM

## 2022-04-20 DIAGNOSIS — Z7189 Other specified counseling: Secondary | ICD-10-CM

## 2022-04-20 DIAGNOSIS — J4520 Mild intermittent asthma, uncomplicated: Secondary | ICD-10-CM

## 2022-04-20 NOTE — Progress Notes (Signed)
[facility] [place], [place] [address]  Pulmonary Sleep Medicine   Office Visit Note  Patient Name: Charles Mata DOB: [birth year] MRN [id number]  Date of Service: 04/26/2022  Complaints/HPI: Pt is here for routine pulmonary follow up. He is in the donut hole and meds are expensive so he uses sample stiolto occasionally. Breathing has been ok. Seems to be worse when it rains. He is using cpap nightly. He admits he is not the best about changing supplies and is going to work on this. Denies SOB unless going up stairs. Working to increase exercise again and work on [employer] loss goals. Denies dryness or headaches and benefits from PAP use. Download shows excellent compliance and apnea controlled with AHI of 0.8.  ROS  General: (-) fever, (-) chills, (-) night sweats, (-) weakness Skin: (-) rashes, (-) itching,. Eyes: (-) visual changes, (-) redness, (-) itching. Nose and Sinuses: (-) nasal stuffiness or itchiness, (-) postnasal drip, (-) nosebleeds, (-) sinus trouble. Mouth and Throat: (-) sore throat, (-) hoarseness. Neck: (-) swollen glands, (-) enlarged thyroid, (-) neck pain. Respiratory: - cough, (-) bloody sputum, - shortness of breath, - wheezing. Cardiovascular: - ankle swelling, (-) chest pain. Lymphatic: (-) lymph node enlargement. Neurologic: (-) numbness, (-) tingling. Psychiatric: (-) anxiety, (-) depression   Current Medication: Outpatient Encounter Medications as of 04/20/2022  Medication Sig   acidophilus (RISAQUAD) CAPS capsule Take 1 capsule by mouth daily.   albuterol (PROVENTIL) (2.5 MG/3ML) 0.083% nebulizer solution Take 3 mLs (2.5 mg total) by nebulization every 6 (six) hours as needed for wheezing or shortness of breath.   Apoaequorin (PREVAGEN PO) Take 1 capsule by mouth daily.   CVS VITAMIN B12 1000 MCG tablet Take 1,000 mcg by mouth daily.   EPINEPHrine (EPIPEN 2-PAK) 0.3 mg/0.3 mL IJ SOAJ injection Inject 0.3 mg into the muscle as  needed for anaphylaxis.   glipiZIDE (GLUCOTROL) 10 MG tablet Take 10 mg by mouth 2 (two) times daily before a meal.   glucose blood test strip    HYDROcodone-acetaminophen (NORCO) 5-325 MG tablet Take 1-2 tablets by mouth every 4 (four) hours as needed for moderate pain or severe pain.   Ipratropium-Albuterol (COMBIVENT) 20-100 MCG/ACT AERS respimat    ipratropium-albuterol (DUONEB) 0.5-2.5 (3) MG/3ML SOLN Take 3 mLs by nebulization every 4 (four) hours as needed.   linaclotide (LINZESS) 72 MCG capsule Take 72 mcg by mouth daily as needed (constipation).   lisinopril (PRINIVIL,ZESTRIL) 5 MG tablet Take 5 mg by mouth daily.   Misc. Devices MISC by Does not apply route. cpap   montelukast (SINGULAIR) 10 MG tablet TAKE 1 TABLET BY MOUTH EVERY DAY   omeprazole (PRILOSEC) 40 MG capsule Take 40 mg by mouth daily.   ondansetron (ZOFRAN ODT) 4 MG disintegrating tablet Take 1 tablet (4 mg total) by mouth every 8 (eight) hours as needed for nausea or vomiting.   OZEMPIC, 0.25 OR 0.5 MG/DOSE, 2 MG/1.5ML SOPN Inject into the skin.   rosuvastatin (CRESTOR) 10 MG tablet Take 5 mg by mouth daily.   STIOLTO RESPIMAT 2.5-2.5 MCG/ACT AERS INHALE 2 PUFFS BY MOUTH INTO THE LUNGS DAILY   tamsulosin (FLOMAX) 0.4 MG CAPS capsule Take 0.4 mg by mouth daily.   Vitamin D, Ergocalciferol, (DRISDOL) 50000 units CAPS capsule Take 50,000 Units by mouth every 7 (seven) days.   No facility-administered encounter medications on file as of 04/20/2022.    Surgical History: Past Surgical History:  Procedure Laterality Date   CARDIAC CATHETERIZATION  x 2. all good   COLONOSCOPY     KNEE ARTHROSCOPY WITH MEDIAL MENISECTOMY Left 02/01/2021   Procedure: Left Arthroscopy partial medial meniscectomy, lateral meniscectomy;  Surgeon: Leim Fabry, MD;  Location: [facility] ORS;  Service: Orthopedics;  Laterality: Left;    Medical History: Past Medical History:  Diagnosis Date   Arthritis    Asthma    BPH (benign prostatic  hyperplasia)    Complication of anesthesia    difficulty waking him up after colonoscopy   Diabetes (HCC)    GERD (gastroesophageal reflux disease)    Hyperlipidemia    Hypertension    Sickle cell trait (HCC)    Sleep apnea    uses cpap    Family History: Family History  Problem Relation Age of Onset   Diabetes Mother    Moyamoya disease Father     Social History: Social History   Socioeconomic History   Marital status: Widowed    Spouse name: Not on file   Number of children: Not on file   Years of education: Not on file   Highest education level: Not on file  Occupational History   Occupation: truck driver    Comment: part time  Tobacco Use   Smoking status: Former   Smokeless tobacco: Never  Vaping Use   Vaping Use: Never used  Substance and Sexual Activity   Alcohol use: Yes    Alcohol/week: 2.0 standard drinks of alcohol    Types: 2 Glasses of wine per week    Comment: no alcohol for 2 months   Drug use: No   Sexual activity: Not on file  Other Topics Concern   Not on file  Social History Narrative   Patient lives with his daughter.She will bring him to the hospital for surgery.   Minimal stairs to climb.   Feels safe in his home.   Social Determinants of Health   Financial Resource Strain: Not on file  Food Insecurity: Not on file  Transportation Needs: Not on file  Physical Activity: Not on file  Stress: Not on file  Social Connections: Not on file  Intimate Partner Violence: Not on file    Vital Signs: Blood pressure 127/72, pulse 70, temperature 98.4 F (36.9 C), resp. rate 16, height '5\' 8"'$  (1.727 m), weight 245 lb 6.4 oz (111.3 kg), SpO2 97 %.  Examination: General Appearance: The patient is well-developed, well-nourished, and in no distress. Skin: Gross inspection of skin unremarkable. Head: normocephalic, no gross deformities. Eyes: no gross deformities noted. ENT: ears appear grossly normal no exudates. Neck: Supple. No thyromegaly.  No LAD. Respiratory: Lungs clear to auscultation bilaterally. Cardiovascular: Normal S1 and S2 without murmur or rub. Extremities: No cyanosis. pulses are equal. Neurologic: Alert and oriented. No involuntary movements.  LABS: No results found for this or any previous visit (from the past 2160 hour(s)).  Radiology: [place] ELASTOGRAPHY LIVER  Result Date: 03/16/2022 CLINICAL DATA:  Fatty liver EXAM: [place] ELASTOGRAPHY HEPATIC TECHNIQUE: Sonography of the liver was performed. In addition, ultrasound elastography evaluation of the liver was performed. A region of interest was placed within the right lobe of the liver. Following application of a compressive sonographic pulse, tissue compressibility was assessed. Multiple assessments were performed at the selected site. Median tissue compressibility was determined. Previously, hepatic stiffness was assessed by shear wave velocity. Based on recently published Society of Radiologists in Ultrasound consensus article, reporting is now recommended to be performed in the SI units of pressure (kiloPascals) representing hepatic stiffness/elasticity. The obtained  result is compared to the published reference standards. (cACLD = compensated Advanced Chronic Liver Disease) COMPARISON:  CT abdomen pelvis 06/04/2019 FINDINGS: Liver: Echogenic parenchyma, likely fatty infiltration though this can be seen with cirrhosis and certain infiltrative disorders. No focal hepatic mass or nodularity. Portal vein is patent on color Doppler imaging with normal direction of blood flow towards the liver. ULTRASOUND HEPATIC ELASTOGRAPHY Device: Siemens Helix VTQ Patient position: Supine Transducer: DAX Number of measurements: 10 Hepatic segment:  8 Median kPa: 8.3 IQR: 4.5 IQR/Median kPa ratio: 0.54 Data quality:  Good Diagnostic category: < or = 9 kPa: in the absence of other known clinical signs, rules out cACLD The use of hepatic elastography is applicable to patients with viral hepatitis and  non-alcoholic fatty liver disease. At this time, there is insufficient data for the referenced cut-off values and use in other causes of liver disease, including alcoholic liver disease. Patients, however, may be assessed by elastography and serve as their own reference standard/baseline. In patients with non-alcoholic liver disease, the values suggesting compensated advanced chronic liver disease (cACLD) may be lower, and patients may need additional testing with elasticity results of 7-9 kPa. Please note that abnormal hepatic elasticity and shear wave velocities may also be identified in clinical settings other than with hepatic fibrosis, such as: acute hepatitis, elevated right heart and central venous pressures including use of beta blockers, veno-occlusive disease (Budd-Chiari), infiltrative processes such as mastocytosis/amyloidosis/infiltrative tumor/lymphoma, extrahepatic cholestasis, with hyperemia in the post-prandial state, and with liver transplantation. Correlation with patient history, laboratory data, and clinical condition recommended. Diagnostic Categories: < or =5 kPa: high probability of being normal < or =9 kPa: in the absence of other known clinical signs, rules out cACLD >9 kPa and ?13 kPa: suggestive of cACLD, but needs further testing >13 kPa: highly suggestive of cACLD > or =17 kPa: highly suggestive of cACLD with an increased probability of clinically significant portal hypertension IMPRESSION: ULTRASOUND LIVER: Echogenic hepatic parenchyma without mass or nodularity; this can be seen with fatty infiltration and cirrhosis. ULTRASOUND HEPATIC ELASTOGRAPHY: Median kPa:  8.3 Diagnostic category: < or = 9 kPa: in the absence of other known clinical signs, rules out cACLD In patients with non-alcoholic liver disease, the values suggesting compensated advanced chronic liver disease (cACLD) may be lower, and patients may need additional testing with elasticity results of 7-9 kPa. Electronically  Signed   By: Lavonia Dana M.D.   On: 03/16/2022 11:56    No results found.  No results found.    Assessment and Plan: Patient Active Problem List   Diagnosis Date Noted   Seasonal allergic rhinitis due to pollen 11/02/2021   Benign paroxysmal positional vertigo 11/30/2016   Chronic midline low back pain without sciatica 10/07/2015   Type 2 diabetes mellitus, controlled ([place]) 11/26/2014   COPD, mild ([place]) 01/29/2014   Sleep apnea 01/29/2014   Hyperlipemia 01/16/2014   Hypertension 01/16/2014    1. OSA on CPAP Continue excellent compliance  2. CPAP use counseling CPAP couseling-Discussed importance of adequate CPAP use as well as proper care and cleaning techniques of machine and all supplies.  3. Seasonal allergic rhinitis due to pollen Continue current medications  4. Mild intermittent chronic asthma without complication Continue inhalers as prescribed, due for PFT - Pulmonary Function Test; Future  5. SOB (shortness of breath) - Pulmonary Function Test; Future  6. Obesity (BMI 30-39.9) Obesity Counseling: Had a lengthy discussion regarding patients BMI and weight issues. Patient was instructed on portion control as well as  increased activity. Also discussed caloric restrictions with trying to maintain intake less than 2000 Kcal. Discussions were made in accordance with the 5As of weight management. Simple actions such as not eating late and if able to, taking a walk is suggested.    General Counseling: I have discussed the findings of the evaluation and examination with Charles Mata.  I have also discussed any further diagnostic evaluation thatmay be needed or ordered today. Charles Mata verbalizes understanding of the findings of todays visit. We also reviewed his medications today and discussed drug interactions and side effects including but not limited excessive drowsiness and altered mental states. We also discussed that there is always a risk not just to him but also people  around him. he has been encouraged to call the office with any questions or concerns that should arise related to todays visit.  Orders Placed This Encounter  Procedures   Pulmonary Function Test    Standing Status:   Future    Standing Expiration Date:   04/21/2023    Order Specific Question:   Where should this test be performed?    Answer:   [facility]    Order Specific Question:   Full PFT: includes the following: basic spirometry, spirometry pre & post bronchodilator, diffusion capacity (DLCO), lung volumes    Answer:   Full PFT     Time spent: 30  I have personally obtained a history, examined the patient, evaluated laboratory and imaging results, formulated the assessment and plan and placed orders. This patient was seen by Drema Dallas, PA-C in collaboration with Dr. Devona Konig as a part of collaborative care agreement.     [name], MD [facility] Pulmonary and Critical Care Sleep medicine

## 2022-04-26 NOTE — Patient Instructions (Signed)

## 2022-05-04 ENCOUNTER — Ambulatory Visit (INDEPENDENT_AMBULATORY_CARE_PROVIDER_SITE_OTHER): Payer: PPO

## 2022-05-04 DIAGNOSIS — J301 Allergic rhinitis due to pollen: Secondary | ICD-10-CM

## 2022-05-15 ENCOUNTER — Ambulatory Visit (LOCAL_COMMUNITY_HEALTH_CENTER): Payer: PPO

## 2022-05-15 DIAGNOSIS — Z23 Encounter for immunization: Secondary | ICD-10-CM | POA: Diagnosis not present

## 2022-05-15 DIAGNOSIS — Z719 Counseling, unspecified: Secondary | ICD-10-CM

## 2022-05-15 NOTE — Progress Notes (Addendum)
  Are you feeling sick today? No   Have you ever received a dose of COVID-19 Vaccine? [employer], [place], [place], [place], Other) No  If yes, which vaccine and how many doses?   5   Did you bring the vaccination record card or other documentation?  No   Do you have a health condition or are undergoing treatment that makes you moderately or severely immunocompromised? This would include, but not be limited to: cancer, HIV, organ transplant, immunosuppressive therapy/high-dose corticosteroids, or moderate/severe primary immunodeficiency.  No  Have you received COVID-19 vaccine before or during hematopoietic cell transplant (HCT) or CAR-T-cell therapies? No  Have you ever had an allergic reaction to: (This would include a severe allergic reaction or a reaction that caused hives, swelling, or respiratory distress, including wheezing.) A component of a COVID-19 vaccine or a previous dose of COVID-19 vaccine? No   Have you ever had an allergic reaction to another vaccine (other thanCOVID-19 vaccine) or an injectable medication? (This would include a severe allergic reaction or a reaction that caused hives, swelling, or respiratory distress, including wheezing.)   No    Do you have a history of any of the following:  Myocarditis or Pericarditis No  Dermal fillers:  No  Multisystem Inflammatory Syndrome (MIS-C or MIS-A)? No  COVID-19 disease within the past 3 months? No  Vaccinated with monkeypox vaccine in the last 4 weeks? No  Comvid - Pfizer/Comirnaty +12Y administered left deltoid.  Tolerated well.  VIS provided. COVID card updated and [facility] updated and copy provided to patient.

## 2022-05-17 ENCOUNTER — Ambulatory Visit: Payer: PPO

## 2022-05-18 DIAGNOSIS — I10 Essential (primary) hypertension: Secondary | ICD-10-CM | POA: Diagnosis not present

## 2022-05-18 DIAGNOSIS — G8929 Other chronic pain: Secondary | ICD-10-CM | POA: Diagnosis not present

## 2022-05-18 DIAGNOSIS — M545 Low back pain, unspecified: Secondary | ICD-10-CM | POA: Diagnosis not present

## 2022-05-18 DIAGNOSIS — K449 Diaphragmatic hernia without obstruction or gangrene: Secondary | ICD-10-CM | POA: Diagnosis not present

## 2022-05-18 DIAGNOSIS — Z6836 Body mass index (BMI) 36.0-36.9, adult: Secondary | ICD-10-CM | POA: Diagnosis not present

## 2022-05-18 DIAGNOSIS — M542 Cervicalgia: Secondary | ICD-10-CM | POA: Diagnosis not present

## 2022-05-18 DIAGNOSIS — Z125 Encounter for screening for malignant neoplasm of prostate: Secondary | ICD-10-CM | POA: Diagnosis not present

## 2022-05-18 DIAGNOSIS — R140 Abdominal distension (gaseous): Secondary | ICD-10-CM | POA: Diagnosis not present

## 2022-05-18 DIAGNOSIS — J4520 Mild intermittent asthma, uncomplicated: Secondary | ICD-10-CM | POA: Diagnosis not present

## 2022-05-18 DIAGNOSIS — K219 Gastro-esophageal reflux disease without esophagitis: Secondary | ICD-10-CM | POA: Diagnosis not present

## 2022-05-18 DIAGNOSIS — E1165 Type 2 diabetes mellitus with hyperglycemia: Secondary | ICD-10-CM | POA: Diagnosis not present

## 2022-05-23 ENCOUNTER — Ambulatory Visit (INDEPENDENT_AMBULATORY_CARE_PROVIDER_SITE_OTHER): Payer: PPO

## 2022-05-23 DIAGNOSIS — J301 Allergic rhinitis due to pollen: Secondary | ICD-10-CM | POA: Diagnosis not present

## 2022-05-25 DIAGNOSIS — K449 Diaphragmatic hernia without obstruction or gangrene: Secondary | ICD-10-CM | POA: Diagnosis not present

## 2022-05-25 DIAGNOSIS — Z6837 Body mass index (BMI) 37.0-37.9, adult: Secondary | ICD-10-CM | POA: Diagnosis not present

## 2022-05-25 DIAGNOSIS — E119 Type 2 diabetes mellitus without complications: Secondary | ICD-10-CM | POA: Diagnosis not present

## 2022-05-25 DIAGNOSIS — E1165 Type 2 diabetes mellitus with hyperglycemia: Secondary | ICD-10-CM | POA: Diagnosis not present

## 2022-05-25 DIAGNOSIS — I10 Essential (primary) hypertension: Secondary | ICD-10-CM | POA: Diagnosis not present

## 2022-05-25 DIAGNOSIS — G4733 Obstructive sleep apnea (adult) (pediatric): Secondary | ICD-10-CM | POA: Diagnosis not present

## 2022-05-25 DIAGNOSIS — K219 Gastro-esophageal reflux disease without esophagitis: Secondary | ICD-10-CM | POA: Diagnosis not present

## 2022-05-25 DIAGNOSIS — M545 Low back pain, unspecified: Secondary | ICD-10-CM | POA: Diagnosis not present

## 2022-05-25 DIAGNOSIS — G8929 Other chronic pain: Secondary | ICD-10-CM | POA: Diagnosis not present

## 2022-05-25 DIAGNOSIS — J4520 Mild intermittent asthma, uncomplicated: Secondary | ICD-10-CM | POA: Diagnosis not present

## 2022-06-06 ENCOUNTER — Ambulatory Visit
Admission: RE | Admit: 2022-06-06 | Discharge: 2022-06-06 | Disposition: A | Discharge status: Home / Self Care | Payer: PPO | Attending: Gastroenterology | Admitting: Gastroenterology

## 2022-06-06 ENCOUNTER — Encounter: Payer: Self-pay | Admitting: *Deleted

## 2022-06-06 ENCOUNTER — Ambulatory Visit: Payer: PPO | Admitting: Anesthesiology

## 2022-06-06 ENCOUNTER — Encounter
Admission: RE | Disposition: A | Discharge status: Home / Self Care | Payer: Self-pay | Source: Home / Self Care | Attending: Gastroenterology

## 2022-06-06 DIAGNOSIS — K640 First degree hemorrhoids: Secondary | ICD-10-CM | POA: Insufficient documentation

## 2022-06-06 DIAGNOSIS — K317 Polyp of stomach and duodenum: Secondary | ICD-10-CM | POA: Insufficient documentation

## 2022-06-06 DIAGNOSIS — G4730 Sleep apnea, unspecified: Secondary | ICD-10-CM | POA: Diagnosis not present

## 2022-06-06 DIAGNOSIS — K5730 Diverticulosis of large intestine without perforation or abscess without bleeding: Secondary | ICD-10-CM | POA: Diagnosis not present

## 2022-06-06 DIAGNOSIS — K635 Polyp of colon: Secondary | ICD-10-CM | POA: Diagnosis not present

## 2022-06-06 DIAGNOSIS — K5900 Constipation, unspecified: Secondary | ICD-10-CM | POA: Diagnosis not present

## 2022-06-06 DIAGNOSIS — I10 Essential (primary) hypertension: Secondary | ICD-10-CM | POA: Diagnosis not present

## 2022-06-06 DIAGNOSIS — D122 Benign neoplasm of ascending colon: Secondary | ICD-10-CM | POA: Insufficient documentation

## 2022-06-06 DIAGNOSIS — K219 Gastro-esophageal reflux disease without esophagitis: Secondary | ICD-10-CM | POA: Insufficient documentation

## 2022-06-06 DIAGNOSIS — D573 Sickle-cell trait: Secondary | ICD-10-CM | POA: Insufficient documentation

## 2022-06-06 DIAGNOSIS — E785 Hyperlipidemia, unspecified: Secondary | ICD-10-CM | POA: Diagnosis not present

## 2022-06-06 DIAGNOSIS — Z87891 Personal history of nicotine dependence: Secondary | ICD-10-CM | POA: Insufficient documentation

## 2022-06-06 DIAGNOSIS — Z1211 Encounter for screening for malignant neoplasm of colon: Secondary | ICD-10-CM | POA: Diagnosis not present

## 2022-06-06 DIAGNOSIS — E119 Type 2 diabetes mellitus without complications: Secondary | ICD-10-CM | POA: Insufficient documentation

## 2022-06-06 DIAGNOSIS — J449 Chronic obstructive pulmonary disease, unspecified: Secondary | ICD-10-CM | POA: Diagnosis not present

## 2022-06-06 DIAGNOSIS — Z86010 Personal history of colonic polyps: Secondary | ICD-10-CM | POA: Diagnosis not present

## 2022-06-06 DIAGNOSIS — K648 Other hemorrhoids: Secondary | ICD-10-CM | POA: Insufficient documentation

## 2022-06-06 DIAGNOSIS — Z7984 Long term (current) use of oral hypoglycemic drugs: Secondary | ICD-10-CM | POA: Diagnosis not present

## 2022-06-06 DIAGNOSIS — E669 Obesity, unspecified: Secondary | ICD-10-CM | POA: Diagnosis not present

## 2022-06-06 HISTORY — PX: COLONOSCOPY WITH PROPOFOL: SHX5780

## 2022-06-06 HISTORY — PX: ESOPHAGOGASTRODUODENOSCOPY (EGD) WITH PROPOFOL: SHX5813

## 2022-06-06 LAB — GLUCOSE, CAPILLARY: Glucose-Capillary: 124 mg/dL — ABNORMAL HIGH (ref 70–99)

## 2022-06-06 SURGERY — COLONOSCOPY WITH PROPOFOL
Anesthesia: General

## 2022-06-06 MED ORDER — PROPOFOL 10 MG/ML IV BOLUS
INTRAVENOUS | Status: DC | PRN
  Administered 2022-06-06: 100 mg via INTRAVENOUS

## 2022-06-06 MED ORDER — PROPOFOL 500 MG/50ML IV EMUL
INTRAVENOUS | Status: DC | PRN
  Administered 2022-06-06: 120 ug/kg/min via INTRAVENOUS

## 2022-06-06 MED ORDER — SODIUM CHLORIDE 0.9 % IV SOLN
INTRAVENOUS | Status: DC
  Administered 2022-06-06: 1000 mL via INTRAVENOUS

## 2022-06-06 MED ORDER — GLYCOPYRROLATE 0.2 MG/ML IJ SOLN
INTRAMUSCULAR | Status: AC
  Filled 2022-06-06: qty 1

## 2022-06-06 MED ORDER — PROPOFOL 1000 MG/100ML IV EMUL
INTRAVENOUS | Status: AC
  Filled 2022-06-06: qty 100

## 2022-06-06 NOTE — Transfer of Care (Signed)
Immediate Anesthesia Transfer of Care Note  Patient: Charles Mata  Procedure(s) Performed: COLONOSCOPY WITH PROPOFOL ESOPHAGOGASTRODUODENOSCOPY (EGD) WITH PROPOFOL  Patient Location: Endoscopy Unit  Anesthesia Type:General  Level of Consciousness: drowsy  Airway & Oxygen Therapy: Patient Spontanous Breathing  Post-op Assessment: Report given to RN and Post -op Vital signs reviewed and stable  Post vital signs: Reviewed  Last Vitals:  Vitals Value Taken Time  BP  06/06/22 1322  Temp 37 C 06/06/22 1322  Pulse 98 06/06/22 1323  Resp 23 06/06/22 1323  SpO2 95 % 06/06/22 1323  Vitals shown include unvalidated device data.  Last Pain:  Vitals:   06/06/22 1322  TempSrc: Temporal  PainSc: Asleep         Complications: No notable events documented.

## 2022-06-06 NOTE — Op Note (Signed)
[facility] Gastroenterology Patient Name: Charles Mata Procedure Date: 06/06/2022 12:07 PM MRN: [id number] Account #: [id number] Date of Birth: [birth year] Admit Type: Outpatient Age: 71 Room: [facility] ENDO ROOM 1 Gender: Male Note Status: Finalized Instrument Name: Upper Endoscope [id number] Procedure:             Upper GI endoscopy Indications:           Gastro-esophageal reflux disease Providers:             Andrey Farmer MD, MD Referring MD:          Tracie Harrier, MD (Referring MD) Medicines:             Monitored Anesthesia Care Complications:         No immediate complications. Estimated blood loss:                         Minimal. Procedure:             Pre-Anesthesia Assessment:                        - Prior to the procedure, a History and Physical was                         performed, and patient medications and allergies were                         reviewed. The patient is competent. The risks and                         benefits of the procedure and the sedation options and                         risks were discussed with the patient. All questions                         were answered and informed consent was obtained.                         Patient identification and proposed procedure were                         verified by the physician, the nurse, the                         anesthesiologist, the anesthetist and the technician                         in the endoscopy suite. Mental Status Examination:                         alert and oriented. Airway Examination: normal                         oropharyngeal airway and neck mobility. Respiratory                         Examination: clear to auscultation. CV Examination:  normal. Prophylactic Antibiotics: The patient does not                         require prophylactic antibiotics. Prior                         Anticoagulants: The patient has taken no previous                          anticoagulant or antiplatelet agents. ASA Grade                         Assessment: II - A patient with mild systemic disease.                         After reviewing the risks and benefits, the patient                         was deemed in satisfactory condition to undergo the                         procedure. The anesthesia plan was to use monitored                         anesthesia care (MAC). Immediately prior to                         administration of medications, the patient was                         re-assessed for adequacy to receive sedatives. The                         heart rate, respiratory rate, oxygen saturations,                         blood pressure, adequacy of pulmonary ventilation, and                         response to care were monitored throughout the                         procedure. The physical status of the patient was                         re-assessed after the procedure.                        After obtaining informed consent, the endoscope was                         passed under direct vision. Throughout the procedure,                         the patient's blood pressure, pulse, and oxygen                         saturations were monitored continuously. The Endoscope  was introduced through the mouth, and advanced to the                         second part of duodenum. The upper GI endoscopy was                         accomplished without difficulty. The patient tolerated                         the procedure well. Findings:      The examined esophagus was normal.      A single 3 mm sessile polyp with no bleeding and no stigmata of recent       bleeding was found in the gastric antrum. The polyp was removed with a       cold snare. Resection and retrieval were complete. Estimated blood loss       was minimal.      Multiple small pedunculated and sessile fundic gland polyps with no       bleeding and no  stigmata of recent bleeding were found in the gastric       fundus and in the gastric body.      The exam of the stomach was otherwise normal.      The examined duodenum was normal. Impression:            - Normal esophagus.                        - A single gastric polyp. Resected and retrieved.                        - Multiple fundic gland polyps.                        - Normal examined duodenum. Recommendation:        - Discharge patient to home.                        - Resume previous diet.                        - Continue present medications.                        - Await pathology results.                        - Return to referring physician as previously                         scheduled. Procedure Code(s):     --- Professional ---                        [phone], Esophagogastroduodenoscopy, flexible,                         transoral; with removal of tumor(s), polyp(s), or                         other lesion(s) by snare technique Diagnosis Code(s):     --- Professional ---  K31.7, Polyp of stomach and duodenum                        K21.9, Gastro-esophageal reflux disease without                         esophagitis CPT copyright 2019 American Medical Association. All rights reserved. The codes documented in this report are preliminary and upon coder review may  be revised to meet current compliance requirements. Andrey Farmer MD, MD 06/06/2022 1:25:23 PM Number of Addenda: 0 Note Initiated On: 06/06/2022 12:07 PM Estimated Blood Loss:  Estimated blood loss was minimal.      [facility]

## 2022-06-06 NOTE — Interval H&P Note (Signed)
History and Physical Interval Note:  06/06/2022 12:48 PM  Charles Mata  has presented today for surgery, with the diagnosis of Constipation GERD H/O Colon Polyps Dysphagia.  The various methods of treatment have been discussed with the patient and family. After consideration of risks, benefits and other options for treatment, the patient has consented to  Procedure(s): COLONOSCOPY WITH PROPOFOL (N/A) ESOPHAGOGASTRODUODENOSCOPY (EGD) WITH PROPOFOL (N/A) as a surgical intervention.  The patient's history has been reviewed, patient examined, no change in status, stable for surgery.  I have reviewed the patient's chart and labs.  Questions were answered to the patient's satisfaction.     [name]  Ok to proceed with EGD/Colonoscopy

## 2022-06-06 NOTE — Anesthesia Preprocedure Evaluation (Addendum)
Anesthesia Evaluation  Patient identified by MRN, date of birth, ID band Patient awake    Reviewed: Allergy & Precautions, NPO status , Patient's Chart, lab work & pertinent test results  History of Anesthesia Complications (+) history of anesthetic complications  Airway Mallampati: II  TM Distance: >3 FB Neck ROM: Full    Dental  (+) Partial Upper, Partial Lower   Pulmonary asthma , sleep apnea and Continuous Positive Airway Pressure Ventilation , COPD,  COPD inhaler, former smoker,    Pulmonary exam normal        Cardiovascular hypertension, Pt. on medications Normal cardiovascular exam     Neuro/Psych negative neurological ROS  negative psych ROS   GI/Hepatic Neg liver ROS, GERD  Medicated and Controlled,  Endo/Other  diabetes, Well Controlled, Oral Hypoglycemic Agents  Renal/GU negative Renal ROS  negative genitourinary   Musculoskeletal  (+) Arthritis , Osteoarthritis,    Abdominal   Peds negative pediatric ROS (+)  Hematology  (+) Blood dyscrasia, Sickle cell trait ,   Anesthesia Other Findings Asthma    Diabetes (HCC)    Hyperlipidemia    Hypertension    Sickle cell trait (HCC)    Sleep apnea       Reproductive/Obstetrics negative OB ROS                             Anesthesia Physical  Anesthesia Plan  ASA: 2  Anesthesia Plan: General   Post-op Pain Management: Minimal or no pain anticipated   Induction: Intravenous  PONV Risk Score and Plan: 2 and Propofol infusion and Treatment may vary due to age or medical condition  Airway Management Planned: Natural Airway  Additional Equipment:   Intra-op Plan:   Post-operative Plan:   Informed Consent: I have reviewed the patients History and Physical, chart, labs and discussed the procedure including the risks, benefits and alternatives for the proposed anesthesia with the patient or authorized representative who has  indicated his/her understanding and acceptance.     Dental advisory given  Plan Discussed with: CRNA, Anesthesiologist and Surgeon  Anesthesia Plan Comments:        Anesthesia Quick Evaluation

## 2022-06-06 NOTE — Op Note (Signed)
[facility] Gastroenterology Patient Name: Charles Mata Procedure Date: 06/06/2022 12:07 PM MRN: [id number] Account #: [id number] Date of Birth: 1951/03/11 Admit Type: Outpatient Age: 71 Room: [facility] ENDO ROOM 1 Gender: Male Note Status: Finalized Instrument Name: Jasper Riling [id number] Procedure:             Colonoscopy Indications:           Surveillance: Personal history of colonic polyps                         (unknown histology) on last colonoscopy more than 5                         years ago Providers:             Andrey Farmer MD, MD Referring MD:          Tracie Harrier, MD (Referring MD) Medicines:             Monitored Anesthesia Care Complications:         No immediate complications. Estimated blood loss:                         Minimal. Procedure:             Pre-Anesthesia Assessment:                        - Prior to the procedure, a History and Physical was                         performed, and patient medications and allergies were                         reviewed. The patient is competent. The risks and                         benefits of the procedure and the sedation options and                         risks were discussed with the patient. All questions                         were answered and informed consent was obtained.                         Patient identification and proposed procedure were                         verified by the physician, the nurse, the                         anesthesiologist, the anesthetist and the technician                         in the endoscopy suite. Mental Status Examination:                         alert and oriented. Airway Examination: normal  oropharyngeal airway and neck mobility. Respiratory                         Examination: clear to auscultation. CV Examination:                         normal. Prophylactic Antibiotics: The patient does not                          require prophylactic antibiotics. Prior                         Anticoagulants: The patient has taken no previous                         anticoagulant or antiplatelet agents. ASA Grade                         Assessment: II - A patient with mild systemic disease.                         After reviewing the risks and benefits, the patient                         was deemed in satisfactory condition to undergo the                         procedure. The anesthesia plan was to use monitored                         anesthesia care (MAC). Immediately prior to                         administration of medications, the patient was                         re-assessed for adequacy to receive sedatives. The                         heart rate, respiratory rate, oxygen saturations,                         blood pressure, adequacy of pulmonary ventilation, and                         response to care were monitored throughout the                         procedure. The physical status of the patient was                         re-assessed after the procedure.                        After obtaining informed consent, the colonoscope was                         passed under direct vision. Throughout the procedure,  the patient's blood pressure, pulse, and oxygen                         saturations were monitored continuously. The                         Colonoscope was introduced through the anus and                         advanced to the the cecum, identified by appendiceal                         orifice and ileocecal valve. The colonoscopy was                         somewhat difficult due to significant looping.                         Successful completion of the procedure was aided by                         applying abdominal pressure. The patient tolerated the                         procedure well. Findings:      The perianal and digital rectal examinations were normal.       A 2 mm polyp was found in the ascending colon. The polyp was sessile.       The polyp was removed with a cold snare. Resection and retrieval were       complete. Estimated blood loss was minimal.      Scattered small-mouthed diverticula were found in the sigmoid colon,       descending colon and ascending colon.      Internal hemorrhoids were found during retroflexion. The hemorrhoids       were Grade I (internal hemorrhoids that do not prolapse).      The exam was otherwise without abnormality on direct and retroflexion       views. Impression:            - One 2 mm polyp in the ascending colon, removed with                         a cold snare. Resected and retrieved.                        - Diverticulosis in the sigmoid colon, in the                         descending colon and in the ascending colon.                        - Internal hemorrhoids.                        - The examination was otherwise normal on direct and                         retroflexion views. Recommendation:        - Discharge patient to home.                        -  Resume previous diet.                        - Continue present medications.                        - Await pathology results.                        - Repeat colonoscopy for surveillance based on                         pathology results.                        - Return to referring physician as previously                         scheduled. Procedure Code(s):     --- Professional ---                        [phone], Colonoscopy, flexible; with removal of                         tumor(s), polyp(s), or other lesion(s) by snare                         technique Diagnosis Code(s):     --- Professional ---                        Z86.010, Personal history of colonic polyps                        K63.5, Polyp of colon                        K64.0, First degree hemorrhoids                        K57.30, Diverticulosis of large intestine without                          perforation or abscess without bleeding CPT copyright 2019 American Medical Association. All rights reserved. The codes documented in this report are preliminary and upon coder review may  be revised to meet current compliance requirements. Andrey Farmer MD, MD 06/06/2022 1:29:00 PM Number of Addenda: 0 Note Initiated On: 06/06/2022 12:07 PM Scope Withdrawal Time: 0 hours 7 minutes 25 seconds  Total Procedure Duration: 0 hours 14 minutes 25 seconds  Estimated Blood Loss:  Estimated blood loss was minimal.      [facility]

## 2022-06-06 NOTE — H&P (Signed)
Outpatient short stay form Pre-procedure 06/06/2022  Charles Rubenstein, MD  Primary Physician: Tracie Harrier, MD  Reason for visit:  GERD/History of polyps  History of present illness:    71 y/o gentleman with history of obesity, hypertension, HLD, and constipation here for EGD/Colonoscopy for GERD and history of polyps. Last colonoscopy in 2010. No blood thinners. No family history of GI malignancies. No significant abdominal surgeries.    Current Facility-Administered Medications:    0.9 %  sodium chloride infusion, , Intravenous, Continuous, Yula Crotwell, Hilton Cork, MD, Last Rate: 20 mL/hr at 06/06/22 1221, 1,000 mL at 06/06/22 1221  Medications Prior to Admission  Medication Sig Dispense Refill Last Dose   acidophilus (RISAQUAD) CAPS capsule Take 1 capsule by mouth daily.   06/05/2022   [employer] VITAMIN B12 1000 MCG tablet Take 1,000 mcg by mouth daily.  4 06/05/2022   glucose blood test strip    06/05/2022   Ipratropium-Albuterol (COMBIVENT) 20-100 MCG/ACT AERS respimat    06/05/2022   linaclotide (LINZESS) 72 MCG capsule Take 72 mcg by mouth daily as needed (constipation).   06/05/2022   lisinopril (PRINIVIL,ZESTRIL) 5 MG tablet Take 5 mg by mouth daily.   06/05/2022   Misc. Devices MISC by Does not apply route. cpap   06/05/2022   montelukast (SINGULAIR) 10 MG tablet TAKE 1 TABLET BY MOUTH EVERY DAY 90 tablet 3 06/05/2022   omeprazole (PRILOSEC) 40 MG capsule Take 40 mg by mouth daily.   06/05/2022   rosuvastatin (CRESTOR) 10 MG tablet Take 5 mg by mouth daily.  5 06/05/2022   tamsulosin (FLOMAX) 0.4 MG CAPS capsule Take 0.4 mg by mouth daily.   06/05/2022   Vitamin D, Ergocalciferol, (DRISDOL) 50000 units CAPS capsule Take 50,000 Units by mouth every 7 (seven) days.   06/05/2022   albuterol (PROVENTIL) (2.5 MG/3ML) 0.083% nebulizer solution Take 3 mLs (2.5 mg total) by nebulization every 6 (six) hours as needed for wheezing or shortness of breath. 150 mL 1  at prn   Apoaequorin  (PREVAGEN PO) Take 1 capsule by mouth daily. (Patient not taking: Reported on 06/06/2022)   Not Taking   EPINEPHrine (EPIPEN 2-PAK) 0.3 mg/0.3 mL IJ SOAJ injection Inject 0.3 mg into the muscle as needed for anaphylaxis. 1 each 3  at prn   glipiZIDE (GLUCOTROL) 10 MG tablet Take 10 mg by mouth 2 (two) times daily before a meal. (Patient not taking: Reported on 06/06/2022)   Not Taking   HYDROcodone-acetaminophen (NORCO) 5-325 MG tablet Take 1-2 tablets by mouth every 4 (four) hours as needed for moderate pain or severe pain. (Patient not taking: Reported on 06/06/2022) 10 tablet 0 Not Taking   ipratropium-albuterol (DUONEB) 0.5-2.5 (3) MG/3ML SOLN Take 3 mLs by nebulization every 4 (four) hours as needed. 360 mL 1  at prn   ondansetron (ZOFRAN ODT) 4 MG disintegrating tablet Take 1 tablet (4 mg total) by mouth every 8 (eight) hours as needed for nausea or vomiting. 20 tablet 0  at prn   OZEMPIC, 0.25 OR 0.5 MG/DOSE, 2 MG/1.5ML SOPN Inject into the skin.   05/31/2022   STIOLTO RESPIMAT 2.5-2.5 MCG/ACT AERS INHALE 2 PUFFS BY MOUTH INTO THE LUNGS DAILY 4 g 4  at prn     Allergies  Allergen Reactions   Aspirin Nausea Only    Irritates stomach (Takes low dose aspirin and it is okay)   Meloxicam Nausea Only    Irritates stomach     Past Medical History:  Diagnosis Date  Arthritis    Asthma    BPH (benign prostatic hyperplasia)    Complication of anesthesia    difficulty waking him up after colonoscopy   Diabetes (HCC)    GERD (gastroesophageal reflux disease)    Hyperlipidemia    Hypertension    Sickle cell trait (HCC)    Sleep apnea    uses cpap    Review of systems:  Otherwise negative.    Physical Exam  Gen: Alert, oriented. Appears stated age.  HEENT: PERRLA. Lungs: No respiratory distress CV: RRR Abd: soft, benign, no masses Ext: No edema    Planned procedures: Proceed with EGD/colonoscopy. The patient understands the nature of the planned procedure, indications,  risks, alternatives and potential complications including but not limited to bleeding, infection, perforation, damage to internal organs and possible oversedation/side effects from anesthesia. The patient agrees and gives consent to proceed.  Please refer to procedure notes for findings, recommendations and patient disposition/instructions.     Charles Rubenstein, MD [facility]ology

## 2022-06-07 ENCOUNTER — Encounter: Payer: Self-pay | Admitting: Gastroenterology

## 2022-06-07 NOTE — Anesthesia Postprocedure Evaluation (Signed)
Anesthesia Post Note  Patient: Charles Mata  Procedure(s) Performed: COLONOSCOPY WITH PROPOFOL ESOPHAGOGASTRODUODENOSCOPY (EGD) WITH PROPOFOL  Patient location during evaluation: PACU Anesthesia Type: General Level of consciousness: awake and alert Pain management: pain level controlled Vital Signs Assessment: post-procedure vital signs reviewed and stable Respiratory status: spontaneous breathing, nonlabored ventilation and respiratory function stable Cardiovascular status: blood pressure returned to baseline and stable Postop Assessment: no apparent nausea or vomiting Anesthetic complications: no   No notable events documented.   Last Vitals:  Vitals:   06/06/22 1332 06/06/22 1352  BP: 114/71   Pulse:  77  Resp:    Temp:    SpO2:  100%    Last Pain:  Vitals:   06/07/22 0730  TempSrc:   PainSc: 0-No pain                 [place] Ouch

## 2022-06-08 LAB — SURGICAL PATHOLOGY

## 2022-06-13 ENCOUNTER — Ambulatory Visit (INDEPENDENT_AMBULATORY_CARE_PROVIDER_SITE_OTHER): Payer: PPO

## 2022-06-13 DIAGNOSIS — J301 Allergic rhinitis due to pollen: Secondary | ICD-10-CM | POA: Diagnosis not present

## 2022-06-27 ENCOUNTER — Ambulatory Visit: Payer: PPO

## 2022-06-27 ENCOUNTER — Ambulatory Visit (INDEPENDENT_AMBULATORY_CARE_PROVIDER_SITE_OTHER): Payer: PPO

## 2022-06-27 DIAGNOSIS — J301 Allergic rhinitis due to pollen: Secondary | ICD-10-CM | POA: Diagnosis not present

## 2022-06-27 DIAGNOSIS — K5900 Constipation, unspecified: Secondary | ICD-10-CM | POA: Diagnosis not present

## 2022-06-27 DIAGNOSIS — K219 Gastro-esophageal reflux disease without esophagitis: Secondary | ICD-10-CM | POA: Diagnosis not present

## 2022-06-27 DIAGNOSIS — K760 Fatty (change of) liver, not elsewhere classified: Secondary | ICD-10-CM | POA: Diagnosis not present

## 2022-06-28 DIAGNOSIS — H2513 Age-related nuclear cataract, bilateral: Secondary | ICD-10-CM | POA: Diagnosis not present

## 2022-06-28 DIAGNOSIS — H43811 Vitreous degeneration, right eye: Secondary | ICD-10-CM | POA: Diagnosis not present

## 2022-06-28 DIAGNOSIS — E119 Type 2 diabetes mellitus without complications: Secondary | ICD-10-CM | POA: Diagnosis not present

## 2022-07-09 ENCOUNTER — Other Ambulatory Visit: Payer: Self-pay

## 2022-07-09 ENCOUNTER — Emergency Department: Payer: PPO

## 2022-07-09 ENCOUNTER — Emergency Department
Admission: EM | Admit: 2022-07-09 | Discharge: 2022-07-09 | Disposition: A | Discharge status: Home / Self Care | Payer: PPO | Attending: Emergency Medicine | Admitting: Emergency Medicine

## 2022-07-09 DIAGNOSIS — E119 Type 2 diabetes mellitus without complications: Secondary | ICD-10-CM | POA: Diagnosis not present

## 2022-07-09 DIAGNOSIS — R7989 Other specified abnormal findings of blood chemistry: Secondary | ICD-10-CM | POA: Insufficient documentation

## 2022-07-09 DIAGNOSIS — Z79899 Other long term (current) drug therapy: Secondary | ICD-10-CM | POA: Diagnosis not present

## 2022-07-09 DIAGNOSIS — J45909 Unspecified asthma, uncomplicated: Secondary | ICD-10-CM | POA: Diagnosis not present

## 2022-07-09 DIAGNOSIS — J449 Chronic obstructive pulmonary disease, unspecified: Secondary | ICD-10-CM | POA: Insufficient documentation

## 2022-07-09 DIAGNOSIS — R051 Acute cough: Secondary | ICD-10-CM | POA: Diagnosis not present

## 2022-07-09 DIAGNOSIS — R509 Fever, unspecified: Secondary | ICD-10-CM | POA: Insufficient documentation

## 2022-07-09 DIAGNOSIS — R059 Cough, unspecified: Secondary | ICD-10-CM | POA: Diagnosis not present

## 2022-07-09 DIAGNOSIS — R000 Tachycardia, unspecified: Secondary | ICD-10-CM | POA: Diagnosis not present

## 2022-07-09 DIAGNOSIS — J9811 Atelectasis: Secondary | ICD-10-CM | POA: Diagnosis not present

## 2022-07-09 DIAGNOSIS — I10 Essential (primary) hypertension: Secondary | ICD-10-CM | POA: Diagnosis not present

## 2022-07-09 DIAGNOSIS — Z20822 Contact with and (suspected) exposure to covid-19: Secondary | ICD-10-CM | POA: Insufficient documentation

## 2022-07-09 LAB — LACTIC ACID, PLASMA
Lactic Acid, Venous: 2.2 mmol/L (ref 0.5–1.9)
Lactic Acid, Venous: 2.5 mmol/L (ref 0.5–1.9)

## 2022-07-09 LAB — RESP PANEL BY RT-PCR (FLU A&B, COVID) ARPGX2
Influenza A by PCR: NEGATIVE
Influenza B by PCR: NEGATIVE
SARS Coronavirus 2 by RT PCR: NEGATIVE

## 2022-07-09 LAB — COMPREHENSIVE METABOLIC PANEL
ALT: 36 U/L (ref 0–44)
AST: 32 U/L (ref 15–41)
Albumin: 4.4 g/dL (ref 3.5–5.0)
Alkaline Phosphatase: 75 U/L (ref 38–126)
Anion gap: 7 (ref 5–15)
BUN: 10 mg/dL (ref 8–23)
CO2: 25 mmol/L (ref 22–32)
Calcium: 9.3 mg/dL (ref 8.9–10.3)
Chloride: 107 mmol/L (ref 98–111)
Creatinine, Ser: 1.21 mg/dL (ref 0.61–1.24)
GFR, Estimated: >60 mL/min (ref >60)
Glucose, Bld: 190 mg/dL — ABNORMAL HIGH (ref 70–99)
Potassium: 3.7 mmol/L (ref 3.5–5.1)
Sodium: 139 mmol/L (ref 135–145)
Total Bilirubin: 1 mg/dL (ref 0.3–1.2)
Total Protein: 7.9 g/dL (ref 6.5–8.1)

## 2022-07-09 LAB — CBC WITH DIFFERENTIAL/PLATELET
Abs Immature Granulocytes: 0.04 10*3/uL (ref 0.00–0.07)
Basophils Absolute: 0 10*3/uL (ref 0.0–0.1)
Basophils Relative: 1 %
Eosinophils Absolute: 0 10*3/uL (ref 0.0–0.5)
Eosinophils Relative: 0 %
HCT: 42.8 % (ref 39.0–52.0)
Hemoglobin: 14.7 g/dL (ref 13.0–17.0)
Immature Granulocytes: 1 %
Lymphocytes Relative: 7 %
Lymphs Abs: 0.6 10*3/uL — ABNORMAL LOW (ref 0.7–4.0)
MCH: 30.9 pg (ref 26.0–34.0)
MCHC: 34.3 g/dL (ref 30.0–36.0)
MCV: 90.1 fL (ref 80.0–100.0)
Monocytes Absolute: 0.6 10*3/uL (ref 0.1–1.0)
Monocytes Relative: 7 %
Neutro Abs: 7.4 10*3/uL (ref 1.7–7.7)
Neutrophils Relative %: 84 %
Platelets: 167 10*3/uL (ref 150–400)
RBC: 4.75 MIL/uL (ref 4.22–5.81)
RDW: 13.2 % (ref 11.5–15.5)
WBC: 8.6 10*3/uL (ref 4.0–10.5)
nRBC: 0 % (ref 0.0–0.2)

## 2022-07-09 LAB — URINALYSIS, ROUTINE W REFLEX MICROSCOPIC
Bacteria, UA: NONE SEEN
Bilirubin Urine: NEGATIVE
Glucose, UA: NEGATIVE mg/dL
Hgb urine dipstick: NEGATIVE
Ketones, ur: NEGATIVE mg/dL
Leukocytes,Ua: NEGATIVE
Nitrite: NEGATIVE
Protein, ur: 30 mg/dL — AB
Specific Gravity, Urine: 1.011 (ref 1.005–1.030)
pH: 6 (ref 5.0–8.0)

## 2022-07-09 LAB — TROPONIN I (HIGH SENSITIVITY): Troponin I (High Sensitivity): 5 ng/L (ref <18)

## 2022-07-09 LAB — PROCALCITONIN: Procalcitonin: <0.1 ng/mL

## 2022-07-09 MED ORDER — AZITHROMYCIN 250 MG PO TABS: ORAL_TABLET | ORAL | 0 refills | Status: AC

## 2022-07-09 MED ORDER — SODIUM CHLORIDE 0.9 % IV BOLUS (SEPSIS)
500.0000 mL | Freq: Once | INTRAVENOUS | Status: AC
  Administered 2022-07-09: 500 mL via INTRAVENOUS

## 2022-07-09 MED ORDER — IOHEXOL 350 MG/ML SOLN
80.0000 mL | Freq: Once | INTRAVENOUS | Status: AC | PRN
  Administered 2022-07-09: 80 mL via INTRAVENOUS

## 2022-07-09 MED ORDER — PREDNISONE 20 MG PO TABS: 40.0000 mg | ORAL_TABLET | Freq: Every day | ORAL | 0 refills | Status: AC

## 2022-07-09 MED ORDER — ALBUTEROL SULFATE (2.5 MG/3ML) 0.083% IN NEBU: 2.5000 mg | INHALATION_SOLUTION | Freq: Four times a day (QID) | RESPIRATORY_TRACT | 1 refills | Status: AC | PRN

## 2022-07-09 MED ORDER — ACETAMINOPHEN 500 MG PO TABS
1000.0000 mg | ORAL_TABLET | Freq: Once | ORAL | Status: AC
  Administered 2022-07-09: 1000 mg via ORAL
  Filled 2022-07-09: qty 2

## 2022-07-09 MED ORDER — PREDNISONE 20 MG PO TABS
40.0000 mg | ORAL_TABLET | Freq: Once | ORAL | Status: AC
  Administered 2022-07-09: 40 mg via ORAL
  Filled 2022-07-09: qty 2

## 2022-07-09 MED ORDER — IPRATROPIUM-ALBUTEROL 0.5-2.5 (3) MG/3ML IN SOLN
3.0000 mL | Freq: Once | RESPIRATORY_TRACT | Status: AC
  Administered 2022-07-09: 3 mL via RESPIRATORY_TRACT
  Filled 2022-07-09: qty 3

## 2022-07-09 NOTE — ED Triage Notes (Signed)
Pt reports cough starting this morning with lots of mucous. Pt reports that when he coughs he feels like the mucous is blocking his airway and can't breathe. No breathing issues when not coughing. No distress noted. Pt reports 101.63F this morning.

## 2022-07-09 NOTE — ED Provider Notes (Signed)
Patient received in signout from Dr. Leonides Schanz pending follow-up CTA reassessment and follow-up lactate.  His CTA does not show any evidence of PE.  Does have some atelectasis.  Has had some low-grade temperature mild tachycardia.  His lactate is slightly elevated as compared to previous but essentially equivocal may be lab error.  He is clinically says that he feels improved but does feel like he is due for a nebulizer treatment.  He is mildly tachycardic but his troponin is negative.  He states he did take nebulizer treatment prior to arrival.  Will give another nebulizer treatment here and reassess.  ----------------------------------------- 8:47 AM on 07/09/2022 -----------------------------------------  Felt significant improvement after nebulizer.  He is coughing up phlegm.  He is mildly tachycardic but is well-perfused peeing clear.  Denies any abdominal pain no chest pain denies any shortness of breath he is able to ambulate down the hall and back without any hypoxia.  Discussed option for further observation here in the hospital versus trial of outpatient management patient feels comfortable with going home.  Given his history of asthma and response to bronchodilators I will put on low-dose steroid.  I am to treat with Z-Pak given the productive cough for its anti-inflammatory component given his bronchitis.  Discussed importance of pushing fluids and signs and symptoms which she should return to the ER.  Patient agreeable to plan.   Merlyn Lot, MD 07/09/22 [phone]

## 2022-07-09 NOTE — ED Notes (Signed)
Latic acid = 2.2

## 2022-07-09 NOTE — ED Provider Notes (Signed)
[facility] Provider Note    Event Date/Time   First MD Initiated Contact with Patient 07/09/22 [phone]     (approximate)   History   Cough   HPI  Charles Mata is a 71 y.o. male with history of COPD, hypertension, hyperlipidemia, diabetes, sleep apnea who presents to the emergency department with daughter for concerns for fevers, cough and shortness of breath that started today.  States he feels like his breathing is better.  Did not take any medications prior to arrival.  Does not wear oxygen at home.  No headache, chest pain, abdominal pain, vomiting, diarrhea, urinary symptoms.  No known sick contacts.   History provided by patient and daughter.    Past Medical History:  Diagnosis Date   Arthritis    Asthma    BPH (benign prostatic hyperplasia)    Complication of anesthesia    difficulty waking him up after colonoscopy   Diabetes ([place])    GERD (gastroesophageal reflux disease)    Hyperlipidemia    Hypertension    Sickle cell trait ([place])    Sleep apnea    uses cpap    Past Surgical History:  Procedure Laterality Date   CARDIAC CATHETERIZATION     x 2. all good   COLONOSCOPY     COLONOSCOPY WITH PROPOFOL N/A 06/06/2022   Procedure: COLONOSCOPY WITH PROPOFOL;  Surgeon: [name], MD;  Location: [facility] ENDOSCOPY;  Service: Endoscopy;  Laterality: N/A;   ESOPHAGOGASTRODUODENOSCOPY (EGD) WITH PROPOFOL N/A 06/06/2022   Procedure: ESOPHAGOGASTRODUODENOSCOPY (EGD) WITH PROPOFOL;  Surgeon: [name], MD;  Location: [facility] ENDOSCOPY;  Service: Endoscopy;  Laterality: N/A;   KNEE ARTHROSCOPY WITH MEDIAL MENISECTOMY Left 02/01/2021   Procedure: Left Arthroscopy partial medial meniscectomy, lateral meniscectomy;  Surgeon: Leim Fabry, MD;  Location: [facility] ORS;  Service: Orthopedics;  Laterality: Left;    MEDICATIONS:  Prior to Admission medications   Medication Sig Start Date End Date Taking? Authorizing Provider  acidophilus  (RISAQUAD) CAPS capsule Take 1 capsule by mouth daily.    [provider]  albuterol (PROVENTIL) (2.5 MG/3ML) 0.083% nebulizer solution Take 3 mLs (2.5 mg total) by nebulization every 6 (six) hours as needed for wheezing or shortness of breath. 11/06/19   [place], Audie Clear, NP  Apoaequorin (PREVAGEN PO) Take 1 capsule by mouth daily. Patient not taking: Reported on 06/06/2022    [provider]  [employer] VITAMIN B12 1000 MCG tablet Take 1,000 mcg by mouth daily. 09/15/17   [provider]  EPINEPHrine (EPIPEN 2-PAK) 0.3 mg/0.3 mL IJ SOAJ injection Inject 0.3 mg into the muscle as needed for anaphylaxis. 05/26/21   [name], MD  glipiZIDE (GLUCOTROL) 10 MG tablet Take 10 mg by mouth 2 (two) times daily before a meal. Patient not taking: Reported on 06/06/2022 11/25/20   [provider]  glucose blood test strip  11/24/16   [provider]  HYDROcodone-acetaminophen (NORCO) 5-325 MG tablet Take 1-2 tablets by mouth every 4 (four) hours as needed for moderate pain or severe pain. Patient not taking: Reported on 06/06/2022 02/01/21   Leim Fabry, MD  Ipratropium-Albuterol (COMBIVENT) 20-100 MCG/ACT AERS respimat  01/29/16   [provider]  ipratropium-albuterol (DUONEB) 0.5-2.5 (3) MG/3ML SOLN Take 3 mLs by nebulization every 4 (four) hours as needed. 11/26/18   Kendell Bane, NP  linaclotide (LINZESS) 72 MCG capsule Take 72 mcg by mouth daily as needed (constipation).    [provider]  lisinopril (PRINIVIL,ZESTRIL) 5  MG tablet Take 5 mg by mouth daily. 05/21/17   [provider]  Misc. Devices MISC by Does not apply route. cpap    [provider]  montelukast (SINGULAIR) 10 MG tablet TAKE 1 TABLET BY MOUTH EVERY DAY 02/19/22   Lavera Guise, MD  omeprazole (PRILOSEC) 40 MG capsule Take 40 mg by mouth daily. 08/08/17   [provider]  ondansetron (ZOFRAN ODT) 4 MG disintegrating tablet Take 1 tablet (4 mg total) by  mouth every 8 (eight) hours as needed for nausea or vomiting. 02/01/21   Leim Fabry, MD  OZEMPIC, 0.25 OR 0.5 MG/DOSE, 2 MG/1.5ML SOPN Inject into the skin. 10/13/21   [provider]  rosuvastatin (CRESTOR) 10 MG tablet Take 5 mg by mouth daily. 09/16/17   [provider]  STIOLTO RESPIMAT 2.5-2.5 MCG/ACT AERS INHALE 2 PUFFS BY MOUTH INTO THE LUNGS DAILY 05/10/21   Devona Konig A, MD  tamsulosin (FLOMAX) 0.4 MG CAPS capsule Take 0.4 mg by mouth daily. 09/17/17   [provider]  Vitamin D, Ergocalciferol, (DRISDOL) 50000 units CAPS capsule Take 50,000 Units by mouth every 7 (seven) days. 08/02/17   [provider]    Physical Exam   Triage Vital Signs: ED Triage Vitals  Enc Vitals Group     BP 07/09/22 0436 (!) 117/93     Pulse Rate 07/09/22 0436 (!) 130     Resp 07/09/22 0436 20     Temp 07/09/22 0436 99.7 F (37.6 C)     Temp Source 07/09/22 0436 Oral     SpO2 07/09/22 0436 95 %     Weight 07/09/22 0437 238 lb (108 kg)     Height 07/09/22 0437 '5\' 8"'$  (1.727 m)     Head Circumference --      Peak Flow --      Pain Score 07/09/22 0436 0     Pain Loc --      Pain Edu? --      Excl. in [place]? --     Most recent vital signs: Vitals:   07/09/22 0529 07/09/22 0612  BP: 128/68 119/73  Pulse: (!) 117 (!) 112  Resp: (!) 21 (!) 21  Temp: 99.6 F (37.6 C)   SpO2: 94% 95%    CONSTITUTIONAL: Alert and oriented and responds appropriately to questions. Well-appearing; well-nourished, nontoxic HEAD: Normocephalic, atraumatic EYES: Conjunctivae clear, pupils appear equal, sclera nonicteric ENT: normal nose; moist mucous membranes NECK: Supple, normal ROM CARD: Regular and tachycardic; S1 and S2 appreciated; no murmurs, no clicks, no rubs, no gallops RESP: Normal chest excursion without splinting or tachypnea; breath sounds clear and equal bilaterally; no wheezes, no rhonchi, no rales, no hypoxia or respiratory distress, speaking full sentences ABD/GI: Normal  bowel sounds; non-distended; soft, non-tender, no rebound, no guarding, no peritoneal signs BACK: The back appears normal EXT: Normal ROM in all joints; no deformity noted, no edema; no cyanosis, no calf tenderness or calf swelling SKIN: Normal color for age and race; warm; no rash on exposed skin NEURO: Moves all extremities equally, normal speech PSYCH: The patient's mood and manner are appropriate.   ED Results / Procedures / Treatments   LABS: (all labs ordered are listed, but only abnormal results are displayed) Labs Reviewed  CBC WITH DIFFERENTIAL/PLATELET - Abnormal; Notable for the following components:      Result Value   Lymphs Abs 0.6 (*)    All other components within normal limits  LACTIC ACID, PLASMA - Abnormal; Notable  for the following components:   Lactic Acid, Venous 2.2 (*)    All other components within normal limits  URINALYSIS, ROUTINE W REFLEX MICROSCOPIC - Abnormal; Notable for the following components:   Color, Urine YELLOW (*)    APPearance HAZY (*)    Protein, ur 30 (*)    All other components within normal limits  COMPREHENSIVE METABOLIC PANEL - Abnormal; Notable for the following components:   Glucose, Bld 190 (*)    All other components within normal limits  RESP PANEL BY RT-PCR (FLU A&B, COVID) ARPGX2  CULTURE, BLOOD (ROUTINE X 2)  CULTURE, BLOOD (ROUTINE X 2)  URINE CULTURE  PROCALCITONIN  LACTIC ACID, PLASMA     EKG:    Date: 07/09/2022 5:00 AM  Rate: 126  Rhythm: Sinus tachycardia  QRS Axis: normal  Intervals: normal  ST/T Wave abnormalities: normal  Conduction Disutrbances: none  Narrative Interpretation: Sinus tachycardia     RADIOLOGY: My personal review and interpretation of imaging: Chest x-ray clear.  I have personally reviewed all radiology reports.   DG Chest Port 1 View  Result Date: 07/09/2022 CLINICAL DATA:  71 year old male with tachycardia.  Coughing. EXAM: PORTABLE CHEST 1 VIEW COMPARISON:  Chest CT 10/19/2020  and earlier. FINDINGS: Portable AP semi upright view at 0502 hours. Lordotic view with perhaps mildly lower lung volumes. Normal cardiac size and mediastinal contours. Visualized tracheal air column is within normal limits. Allowing for portable technique the lungs are clear. No pneumothorax or pleural effusion identified. Visible bowel-gas pattern within normal limits. No acute osseous abnormality identified. IMPRESSION: Negative portable chest. Electronically Signed   By: Genevie Ann M.D.   On: 07/09/2022 05:43     PROCEDURES:  Critical Care performed: No    .1-3 Lead EKG Interpretation  Performed by: Medhansh Brinkmeier, Delice Bison, DO Authorized by: Culver Feighner, Delice Bison, DO     Interpretation: abnormal     ECG rate:  130   ECG rate assessment: tachycardic     Rhythm: sinus tachycardia     Ectopy: none     Conduction: normal       IMPRESSION / MDM / ASSESSMENT AND PLAN / ED COURSE  I reviewed the triage vital signs and the nursing notes.    Patient here with concerns for possible sepsis.  He is tachycardic, was febrile at home and has cough and shortness of breath.  The patient is on the cardiac monitor to evaluate for evidence of arrhythmia and/or significant heart rate changes.   DIFFERENTIAL DIAGNOSIS (includes but not limited to):   Pneumonia, viral URI, COVID, flu, PE, pneumothorax, less likely CHF, ACS   Patient's presentation is most consistent with acute presentation with potential threat to life or bodily function.   PLAN: We will obtain CBC, CMP, lactic, procalcitonin, urinalysis and urine culture, blood cultures, COVID and flu swabs, chest x-ray.  Will perform rectal temp.  Currently lungs are clear to auscultation without wheezing and his sats are 95% on room air at rest.  Able to walk back to the room without any dyspnea on exertion.   MEDICATIONS GIVEN IN ED: Medications  sodium chloride 0.9 % bolus 500 mL (has no administration in time range)  acetaminophen (TYLENOL) tablet  1,000 mg (1,000 mg Oral Given 07/09/22 0557)  sodium chloride 0.9 % bolus 500 mL (0 mLs Intravenous Stopped 07/09/22 0710)  sodium chloride 0.9 % bolus 500 mL (0 mLs Intravenous Stopped 07/09/22 0710)  iohexol (OMNIPAQUE) 350 MG/ML injection 80 mL (80 mLs Intravenous Contrast  Given 07/09/22 0625)     ED COURSE: Labs show no leukocytosis and no leukopenia.  Procalcitonin negative.  Normal electrolytes and renal function.  Lactic slightly elevated.  Getting IV fluids and will need repeat lactic.  COVID and flu negative.  Urine shows no sign of infection.  Chest x-ray reviewed and interpreted by myself and the radiologist and shows no pneumonia.  Rectal temperature 99.6.  We will give Tylenol in case tachycardia is secondary to elevated temperature although not technically a fever.   Patient continues to be slightly tachycardic and tachypneic despite IV fluids and antipyretics.  We will continue hydration.  We will proceed with CTA of the chest to evaluate for PE.   7:10 AM  CTA chest results pending.  Patient will need repeat lactic after additional IV fluids and repeat vital signs.  Signed out to oncoming ED physician.   CONSULTS: Dispo pending further work-up.   OUTSIDE RECORDS REVIEWED: Reviewed patient's gastroenterology note with Denice Paradise on 06/27/2022.       FINAL CLINICAL IMPRESSION(S) / ED DIAGNOSES   Final diagnoses:  Fever, unspecified fever cause  Acute cough     Rx / DC Orders   ED Discharge Orders     None        Note:  This document was prepared using Dragon voice recognition software and may include unintentional dictation errors.   Eddy Termine, Delice Bison, DO 07/09/22 [phone]

## 2022-07-09 NOTE — ED Notes (Signed)
Per lab, pt lactic is critical at 2.2, EDP Ward verbally notified.

## 2022-07-10 LAB — URINE CULTURE: Culture: <10000 — AB

## 2022-07-12 ENCOUNTER — Ambulatory Visit: Payer: PPO

## 2022-07-14 LAB — CULTURE, BLOOD (ROUTINE X 2)
Culture: NO GROWTH
Culture: NO GROWTH
Special Requests: ADEQUATE

## 2022-08-02 DIAGNOSIS — M5442 Lumbago with sciatica, left side: Secondary | ICD-10-CM | POA: Diagnosis not present

## 2022-08-15 DIAGNOSIS — M25552 Pain in left hip: Secondary | ICD-10-CM | POA: Diagnosis not present

## 2022-08-15 DIAGNOSIS — M545 Low back pain, unspecified: Secondary | ICD-10-CM | POA: Diagnosis not present

## 2022-08-15 DIAGNOSIS — M5442 Lumbago with sciatica, left side: Secondary | ICD-10-CM | POA: Diagnosis not present

## 2022-08-15 DIAGNOSIS — E1165 Type 2 diabetes mellitus with hyperglycemia: Secondary | ICD-10-CM | POA: Diagnosis not present

## 2022-08-15 DIAGNOSIS — K5900 Constipation, unspecified: Secondary | ICD-10-CM | POA: Diagnosis not present

## 2022-08-16 ENCOUNTER — Ambulatory Visit: Payer: PPO

## 2022-08-30 ENCOUNTER — Other Ambulatory Visit: Payer: Self-pay | Admitting: Physical Medicine & Rehabilitation

## 2022-08-30 DIAGNOSIS — M5442 Lumbago with sciatica, left side: Secondary | ICD-10-CM | POA: Diagnosis not present

## 2022-09-03 ENCOUNTER — Ambulatory Visit
Admission: RE | Admit: 2022-09-03 | Discharge: 2022-09-03 | Disposition: A | Discharge status: Home / Self Care | Payer: PPO | Source: Ambulatory Visit | Attending: Physical Medicine & Rehabilitation | Admitting: Physical Medicine & Rehabilitation

## 2022-09-03 DIAGNOSIS — M5442 Lumbago with sciatica, left side: Secondary | ICD-10-CM | POA: Insufficient documentation

## 2022-09-03 DIAGNOSIS — M5126 Other intervertebral disc displacement, lumbar region: Secondary | ICD-10-CM | POA: Diagnosis not present

## 2022-09-06 DIAGNOSIS — M48062 Spinal stenosis, lumbar region with neurogenic claudication: Secondary | ICD-10-CM | POA: Diagnosis not present

## 2022-09-06 DIAGNOSIS — M5442 Lumbago with sciatica, left side: Secondary | ICD-10-CM | POA: Diagnosis not present

## 2022-09-07 DIAGNOSIS — M48062 Spinal stenosis, lumbar region with neurogenic claudication: Secondary | ICD-10-CM | POA: Diagnosis not present

## 2022-09-07 DIAGNOSIS — E119 Type 2 diabetes mellitus without complications: Secondary | ICD-10-CM | POA: Diagnosis not present

## 2022-09-21 DIAGNOSIS — G8929 Other chronic pain: Secondary | ICD-10-CM | POA: Diagnosis not present

## 2022-09-21 DIAGNOSIS — M545 Low back pain, unspecified: Secondary | ICD-10-CM | POA: Diagnosis not present

## 2022-09-21 DIAGNOSIS — K449 Diaphragmatic hernia without obstruction or gangrene: Secondary | ICD-10-CM | POA: Diagnosis not present

## 2022-09-21 DIAGNOSIS — K219 Gastro-esophageal reflux disease without esophagitis: Secondary | ICD-10-CM | POA: Diagnosis not present

## 2022-09-21 DIAGNOSIS — J4520 Mild intermittent asthma, uncomplicated: Secondary | ICD-10-CM | POA: Diagnosis not present

## 2022-09-21 DIAGNOSIS — Z6837 Body mass index (BMI) 37.0-37.9, adult: Secondary | ICD-10-CM | POA: Diagnosis not present

## 2022-09-21 DIAGNOSIS — G4733 Obstructive sleep apnea (adult) (pediatric): Secondary | ICD-10-CM | POA: Diagnosis not present

## 2022-09-21 DIAGNOSIS — E1165 Type 2 diabetes mellitus with hyperglycemia: Secondary | ICD-10-CM | POA: Diagnosis not present

## 2022-09-21 DIAGNOSIS — I10 Essential (primary) hypertension: Secondary | ICD-10-CM | POA: Diagnosis not present

## 2022-09-21 DIAGNOSIS — K760 Fatty (change of) liver, not elsewhere classified: Secondary | ICD-10-CM | POA: Diagnosis not present

## 2022-09-26 DIAGNOSIS — M48062 Spinal stenosis, lumbar region with neurogenic claudication: Secondary | ICD-10-CM | POA: Diagnosis not present

## 2022-09-26 DIAGNOSIS — M5442 Lumbago with sciatica, left side: Secondary | ICD-10-CM | POA: Diagnosis not present

## 2022-09-28 DIAGNOSIS — K219 Gastro-esophageal reflux disease without esophagitis: Secondary | ICD-10-CM | POA: Diagnosis not present

## 2022-09-28 DIAGNOSIS — G8929 Other chronic pain: Secondary | ICD-10-CM | POA: Diagnosis not present

## 2022-09-28 DIAGNOSIS — J4520 Mild intermittent asthma, uncomplicated: Secondary | ICD-10-CM | POA: Diagnosis not present

## 2022-09-28 DIAGNOSIS — Z6835 Body mass index (BMI) 35.0-35.9, adult: Secondary | ICD-10-CM | POA: Diagnosis not present

## 2022-09-28 DIAGNOSIS — Z0000 Encounter for general adult medical examination without abnormal findings: Secondary | ICD-10-CM | POA: Diagnosis not present

## 2022-09-28 DIAGNOSIS — J449 Chronic obstructive pulmonary disease, unspecified: Secondary | ICD-10-CM | POA: Diagnosis not present

## 2022-09-28 DIAGNOSIS — K449 Diaphragmatic hernia without obstruction or gangrene: Secondary | ICD-10-CM | POA: Diagnosis not present

## 2022-09-28 DIAGNOSIS — M5442 Lumbago with sciatica, left side: Secondary | ICD-10-CM | POA: Diagnosis not present

## 2022-09-28 DIAGNOSIS — I10 Essential (primary) hypertension: Secondary | ICD-10-CM | POA: Diagnosis not present

## 2022-09-28 DIAGNOSIS — H8113 Benign paroxysmal vertigo, bilateral: Secondary | ICD-10-CM | POA: Diagnosis not present

## 2022-10-04 ENCOUNTER — Ambulatory Visit (INDEPENDENT_AMBULATORY_CARE_PROVIDER_SITE_OTHER): Payer: PPO | Admitting: Internal Medicine

## 2022-10-04 DIAGNOSIS — J4520 Mild intermittent asthma, uncomplicated: Secondary | ICD-10-CM

## 2022-10-04 DIAGNOSIS — R0602 Shortness of breath: Secondary | ICD-10-CM

## 2022-10-06 DIAGNOSIS — E119 Type 2 diabetes mellitus without complications: Secondary | ICD-10-CM | POA: Diagnosis not present

## 2022-10-06 DIAGNOSIS — M48062 Spinal stenosis, lumbar region with neurogenic claudication: Secondary | ICD-10-CM | POA: Diagnosis not present

## 2022-10-15 NOTE — Procedures (Signed)
[facility] [place], [address]    Complete Pulmonary Function Testing Interpretation:  FINDINGS:  The forced vital capacity is normal FEV1 is normal F1 FVC ratio is normal.  Postbronchodilator no significant change in the FEV1 is noted.  Total lung capacity is mildly decreased.  Residual volume is decreased.  FRC is decreased.  DLCO is within normal limits.  IMPRESSION:  This pulmonary function study is consistent with mild restrictive lung disease clinical correlation is recommended  [name], MD [facility] Pulmonary Critical Care Medicine Sleep Medicine

## 2022-10-16 ENCOUNTER — Ambulatory Visit (INDEPENDENT_AMBULATORY_CARE_PROVIDER_SITE_OTHER): Payer: PPO | Admitting: Physician Assistant

## 2022-10-16 ENCOUNTER — Encounter: Payer: Self-pay | Admitting: Physician Assistant

## 2022-10-16 VITALS — BP 127/73 | HR 82 | Temp 97.5°F | Resp 16 | Ht 68.0 in | Wt 240.6 lb

## 2022-10-16 DIAGNOSIS — E669 Obesity, unspecified: Secondary | ICD-10-CM

## 2022-10-16 DIAGNOSIS — J4520 Mild intermittent asthma, uncomplicated: Secondary | ICD-10-CM

## 2022-10-16 DIAGNOSIS — J301 Allergic rhinitis due to pollen: Secondary | ICD-10-CM

## 2022-10-16 DIAGNOSIS — Z7189 Other specified counseling: Secondary | ICD-10-CM | POA: Diagnosis not present

## 2022-10-16 DIAGNOSIS — G4733 Obstructive sleep apnea (adult) (pediatric): Secondary | ICD-10-CM

## 2022-10-16 MED ORDER — COMBIVENT RESPIMAT 20-100 MCG/ACT IN AERS: 1.0000 | INHALATION_SPRAY | Freq: Four times a day (QID) | RESPIRATORY_TRACT | 3 refills | Status: DC | PRN

## 2022-10-16 NOTE — Progress Notes (Signed)
[facility] [place], [place] [address]  Pulmonary Sleep Medicine   Office Visit Note  Patient Name: Charles Mata DOB: 05-12-1951 MRN [id number]  Date of Service: 10/16/2022  Complaints/HPI: Pt is here for routine pulmonary follow up. Breathing has been doing well. Only uses combivent inhaler as needed. Did have a possible asthma attack in Nov where he was coughing up mucus and felt very tight and couldn't catch breath and was wheezing. Daughter called 103 and started to feel better and drove himself to ED and had testing done. He had slight fever and he was treated with zpak and prednisone. He did have normal CXR and CTA done. He is wearing CPAP nightly, though doesn't like it. He knows he needs it though and will continue wearing. He quit smoking in the 90s and hasn't gone back and has no desire to. PFT shows mild restrictive disease which is consistent from previous findings.  ROS  General: (-) fever, (-) chills, (-) night sweats, (-) weakness Skin: (-) rashes, (-) itching,. Eyes: (-) visual changes, (-) redness, (-) itching. Nose and Sinuses: (-) nasal stuffiness or itchiness, (-) postnasal drip, (-) nosebleeds, (-) sinus trouble. Mouth and Throat: (-) sore throat, (-) hoarseness. Neck: (-) swollen glands, (-) enlarged thyroid, (-) neck pain. Respiratory: - cough, (-) bloody sputum, - shortness of breath, - wheezing. Cardiovascular: - ankle swelling, (-) chest pain. Lymphatic: (-) lymph node enlargement. Neurologic: (-) numbness, (-) tingling. Psychiatric: (-) anxiety, (-) depression   Current Medication: Outpatient Encounter Medications as of 10/16/2022  Medication Sig   acidophilus (RISAQUAD) CAPS capsule Take 1 capsule by mouth daily.   albuterol (PROVENTIL) (2.5 MG/3ML) 0.083% nebulizer solution Take 3 mLs (2.5 mg total) by nebulization every 6 (six) hours as needed for wheezing or shortness of breath.   Apoaequorin (PREVAGEN PO) Take 1 capsule by  mouth daily.   [employer]S VITAMIN B12 1000 MCG tablet Take 1,000 mcg by mouth daily.   EPINEPHrine (EPIPEN 2-PAK) 0.3 mg/0.3 mL IJ SOAJ injection Inject 0.3 mg into the muscle as needed for anaphylaxis.   glipiZIDE (GLUCOTROL) 10 MG tablet Take 10 mg by mouth 2 (two) times daily before a meal.   glucose blood test strip    HYDROcodone-acetaminophen (NORCO) 5-325 MG tablet Take 1-2 tablets by mouth every 4 (four) hours as needed for moderate pain or severe pain.   Ipratropium-Albuterol (COMBIVENT RESPIMAT) 20-100 MCG/ACT AERS respimat Inhale 1 puff into the lungs every 6 (six) hours as needed for wheezing.   ipratropium-albuterol (DUONEB) 0.5-2.5 (3) MG/3ML SOLN Take 3 mLs by nebulization every 4 (four) hours as needed.   linaclotide (LINZESS) 72 MCG capsule Take 72 mcg by mouth daily as needed (constipation).   lisinopril (PRINIVIL,ZESTRIL) 5 MG tablet Take 5 mg by mouth daily.   Misc. Devices MISC by Does not apply route. cpap   montelukast (SINGULAIR) 10 MG tablet TAKE 1 TABLET BY MOUTH EVERY DAY   omeprazole (PRILOSEC) 40 MG capsule Take 40 mg by mouth daily.   ondansetron (ZOFRAN ODT) 4 MG disintegrating tablet Take 1 tablet (4 mg total) by mouth every 8 (eight) hours as needed for nausea or vomiting.   OZEMPIC, 0.25 OR 0.5 MG/DOSE, 2 MG/1.5ML SOPN Inject into the skin.   rosuvastatin (CRESTOR) 10 MG tablet Take 5 mg by mouth daily.   tamsulosin (FLOMAX) 0.4 MG CAPS capsule Take 0.4 mg by mouth daily.   Vitamin D, Ergocalciferol, (DRISDOL) 50000 units CAPS capsule Take 50,000 Units by mouth every  7 (seven) days.   [DISCONTINUED] Ipratropium-Albuterol (COMBIVENT) 20-100 MCG/ACT AERS respimat    [DISCONTINUED] STIOLTO RESPIMAT 2.5-2.5 MCG/ACT AERS INHALE 2 PUFFS BY MOUTH INTO THE LUNGS DAILY   No facility-administered encounter medications on file as of 10/16/2022.    Surgical History: Past Surgical History:  Procedure Laterality Date   CARDIAC CATHETERIZATION     x 2. all good   COLONOSCOPY      COLONOSCOPY WITH PROPOFOL N/A 06/06/2022   Procedure: COLONOSCOPY WITH PROPOFOL;  Surgeon: [name], MD;  Location: [facility] ENDOSCOPY;  Service: Endoscopy;  Laterality: N/A;   ESOPHAGOGASTRODUODENOSCOPY (EGD) WITH PROPOFOL N/A 06/06/2022   Procedure: ESOPHAGOGASTRODUODENOSCOPY (EGD) WITH PROPOFOL;  Surgeon: [name], MD;  Location: [facility] ENDOSCOPY;  Service: Endoscopy;  Laterality: N/A;   KNEE ARTHROSCOPY WITH MEDIAL MENISECTOMY Left 02/01/2021   Procedure: Left Arthroscopy partial medial meniscectomy, lateral meniscectomy;  Surgeon: Leim Fabry, MD;  Location: [facility] ORS;  Service: Orthopedics;  Laterality: Left;    Medical History: Past Medical History:  Diagnosis Date   Arthritis    Asthma    BPH (benign prostatic hyperplasia)    Complication of anesthesia    difficulty waking him up after colonoscopy   Diabetes ([facility])    GERD (gastroesophageal reflux disease)    Hyperlipidemia    Hypertension    Sickle cell trait ([facility])    Sleep apnea    uses cpap    Family History: Family History  Problem Relation Age of Onset   Diabetes Mother    [place] disease Father     Social History: Social History   Socioeconomic History   Marital status: Widowed    Spouse name: Not on file   Number of children: Not on file   Years of education: Not on file   Highest education level: Not on file  Occupational History   Occupation: truck driver    Comment: part time  Tobacco Use   Smoking status: Former   Smokeless tobacco: Never  Vaping Use   Vaping Use: Never used  Substance and Sexual Activity   Alcohol use: Yes    Alcohol/week: 2.0 standard drinks of alcohol    Types: 2 Glasses of wine per week    Comment: no alcohol for 2 months   Drug use: No   Sexual activity: Not on file  Other Topics Concern   Not on file  Social History Narrative   Patient lives with his daughter.She will bring him to the hospital for surgery.   Minimal stairs to climb.   Feels safe  in his home.   Social Determinants of Health   Financial Resource Strain: Not on file  Food Insecurity: Not on file  Transportation Needs: Not on file  Physical Activity: Not on file  Stress: Not on file  Social Connections: Not on file  Intimate Partner Violence: Not on file    Vital Signs: Blood pressure 127/73, pulse 82, temperature (!) 97.5 F (36.4 C), resp. rate 16, height '5\' 8"'$  (1.727 m), weight 240 lb 9.6 oz (109.1 kg), SpO2 96 %.  Examination: General Appearance: The patient is well-developed, well-nourished, and in no distress. Skin: Gross inspection of skin unremarkable. Head: normocephalic, no gross deformities. Eyes: no gross deformities noted. ENT: ears appear grossly normal no exudates. Neck: Supple. No thyromegaly. No LAD. Respiratory: Lungs clear to auscultation bilaterally. Cardiovascular: Normal S1 and S2 without murmur or rub. Extremities: No cyanosis. pulses are equal. Neurologic: Alert and oriented. No involuntary movements.  LABS: No results found  for this or any previous visit (from the past 2160 hour(s)).  Radiology: MR LUMBAR SPINE WO CONTRAST  Result Date: 09/04/2022 CLINICAL DATA:  Left hip and leg pain over the last 12 months EXAM: MRI LUMBAR SPINE WITHOUT CONTRAST TECHNIQUE: Multiplanar, multisequence MR imaging of the lumbar spine was performed. No intravenous contrast was administered. COMPARISON:  None Available. FINDINGS: Segmentation:  5 lumbar type vertebral bodies. Alignment:  Normal Vertebrae:  No fracture or focal bone lesion. Conus medullaris and cauda equina: Conus extends to the L1 level. Conus and cauda equina appear normal. Paraspinal and other soft tissues: Negative Disc levels: T12-L1 and L1-2: Normal L2-3: Mild bulging of the disc. Mild facet and ligamentous hypertrophy. No compressive stenosis. L3-4: Mild to moderate bulging of the disc. Mild facet and ligamentous hypertrophy. Mild narrowing of the lateral recesses and foramina but no  visible neural compression. L4-5: Moderate bulging of the disc more prominent on the right. Facet and ligamentous hypertrophy. Stenosis of the right lateral recess that could possibly affect the right L5 nerve. No left-sided neural compression is identified. L5-S1: Mild bulging of the disc. Facet and ligamentous hypertrophy. No compressive canal or foraminal stenosis. IMPRESSION: 1. L2-3: Disc bulge. Mild facet and ligamentous hypertrophy. No compressive stenosis. 2. L3-4: Disc bulge. Mild facet and ligamentous hypertrophy. Mild narrowing of the lateral recesses and foramina but no visible neural compression. 3. L4-5: Moderate bulging of the disc more prominent on the right. Facet and ligamentous hypertrophy. Stenosis of the right lateral recess that could possibly affect the right L5 nerve. No left-sided neural compression is identified. 4. L5-S1: Disc bulge. Facet and ligamentous hypertrophy. No compressive stenosis. 5. Certainly, the degenerative disc disease and degenerative facet disease could relate to low back pain. The patient could also potentially experience referred facet syndrome pain. Electronically Signed   By: Nelson Chimes M.D.   On: 09/04/2022 08:05    No results found.  No results found.    Assessment and Plan: Patient Active Problem List   Diagnosis Date Noted   Seasonal allergic rhinitis due to pollen 11/02/2021   Benign paroxysmal positional vertigo 11/30/2016   Chronic midline low back pain without sciatica 10/07/2015   Type 2 diabetes mellitus, controlled ([place]) 11/26/2014   COPD, mild ([place]) 01/29/2014   Sleep apnea 01/29/2014   Hyperlipemia 01/16/2014   Hypertension 01/16/2014    1. Mild intermittent chronic asthma without complication May continue combivent as needed and is on singulair - Ipratropium-Albuterol (COMBIVENT RESPIMAT) 20-100 MCG/ACT AERS respimat; Inhale 1 puff into the lungs every 6 (six) hours as needed for wheezing.  Dispense: 4 g; Refill: 3  2. OSA on  CPAP Continue nightly use  3. CPAP use counseling CPAP couseling-Discussed importance of adequate CPAP use as well as proper care and cleaning techniques of machine and all supplies.  4. Seasonal allergic rhinitis due to pollen [J30.1] Continue singulair as before  5. Obesity (BMI 30-39.9) Obesity Counseling: Had a lengthy discussion regarding patients BMI and weight issues. Patient was instructed on portion control as well as increased activity. Also discussed caloric restrictions with trying to maintain intake less than 2000 Kcal. Discussions were made in accordance with the 5As of weight management. Simple actions such as not eating late and if able to, taking a walk is suggested.    General Counseling: I have discussed the findings of the evaluation and examination with Charles Mata.  I have also discussed any further diagnostic evaluation thatmay be needed or ordered today. Charles Mata verbalizes understanding of  the findings of todays visit. We also reviewed his medications today and discussed drug interactions and side effects including but not limited excessive drowsiness and altered mental states. We also discussed that there is always a risk not just to him but also people around him. he has been encouraged to call the office with any questions or concerns that should arise related to todays visit.  No orders of the defined types were placed in this encounter.    Time spent: 30  I have personally obtained a history, examined the patient, evaluated laboratory and imaging results, formulated the assessment and plan and placed orders. This patient was seen by Drema Dallas, PA-C in collaboration with Dr. Devona Konig as a part of collaborative care agreement.     [name], MD [facility] Pulmonary and Critical Care Sleep medicine

## 2022-10-17 LAB — PULMONARY FUNCTION TEST

## 2022-10-24 DIAGNOSIS — M5442 Lumbago with sciatica, left side: Secondary | ICD-10-CM | POA: Diagnosis not present

## 2022-10-24 DIAGNOSIS — M48062 Spinal stenosis, lumbar region with neurogenic claudication: Secondary | ICD-10-CM | POA: Diagnosis not present

## 2022-10-24 DIAGNOSIS — M47816 Spondylosis without myelopathy or radiculopathy, lumbar region: Secondary | ICD-10-CM | POA: Diagnosis not present

## 2022-11-02 DIAGNOSIS — M47816 Spondylosis without myelopathy or radiculopathy, lumbar region: Secondary | ICD-10-CM | POA: Diagnosis not present

## 2022-11-08 DIAGNOSIS — H1131 Conjunctival hemorrhage, right eye: Secondary | ICD-10-CM | POA: Diagnosis not present

## 2022-11-08 DIAGNOSIS — H1031 Unspecified acute conjunctivitis, right eye: Secondary | ICD-10-CM | POA: Diagnosis not present

## 2022-11-15 DIAGNOSIS — M5442 Lumbago with sciatica, left side: Secondary | ICD-10-CM | POA: Diagnosis not present

## 2022-11-15 DIAGNOSIS — M47816 Spondylosis without myelopathy or radiculopathy, lumbar region: Secondary | ICD-10-CM | POA: Diagnosis not present

## 2022-11-15 DIAGNOSIS — M48062 Spinal stenosis, lumbar region with neurogenic claudication: Secondary | ICD-10-CM | POA: Diagnosis not present

## 2022-11-29 DIAGNOSIS — E119 Type 2 diabetes mellitus without complications: Secondary | ICD-10-CM | POA: Diagnosis not present

## 2022-11-29 DIAGNOSIS — G4730 Sleep apnea, unspecified: Secondary | ICD-10-CM | POA: Diagnosis not present

## 2022-11-29 DIAGNOSIS — I10 Essential (primary) hypertension: Secondary | ICD-10-CM | POA: Diagnosis not present

## 2022-11-29 DIAGNOSIS — J449 Chronic obstructive pulmonary disease, unspecified: Secondary | ICD-10-CM | POA: Diagnosis not present

## 2022-12-01 DIAGNOSIS — M47816 Spondylosis without myelopathy or radiculopathy, lumbar region: Secondary | ICD-10-CM | POA: Diagnosis not present

## 2022-12-13 ENCOUNTER — Ambulatory Visit (INDEPENDENT_AMBULATORY_CARE_PROVIDER_SITE_OTHER): Payer: PPO

## 2022-12-13 DIAGNOSIS — M48062 Spinal stenosis, lumbar region with neurogenic claudication: Secondary | ICD-10-CM | POA: Diagnosis not present

## 2022-12-13 DIAGNOSIS — G8929 Other chronic pain: Secondary | ICD-10-CM | POA: Diagnosis not present

## 2022-12-13 DIAGNOSIS — M5442 Lumbago with sciatica, left side: Secondary | ICD-10-CM | POA: Diagnosis not present

## 2022-12-13 DIAGNOSIS — G4733 Obstructive sleep apnea (adult) (pediatric): Secondary | ICD-10-CM

## 2022-12-13 NOTE — Progress Notes (Signed)
95 percentile pressure 9   95th percentile leak 12.7   apnea index 0.7 /hr  apnea-hypopnea index  1.0 /hr   total days used  >4 hr 89 days  total days used <4 hr 0 days  Total compliance 99 percent  He is doing great ,would like to try new mask. I will check on a new mask for him.   Pt was seen by Tresa Endo  RRT/RCP  from [facility]

## 2022-12-26 DIAGNOSIS — K760 Fatty (change of) liver, not elsewhere classified: Secondary | ICD-10-CM | POA: Diagnosis not present

## 2022-12-26 DIAGNOSIS — M48062 Spinal stenosis, lumbar region with neurogenic claudication: Secondary | ICD-10-CM | POA: Diagnosis not present

## 2022-12-26 DIAGNOSIS — K5900 Constipation, unspecified: Secondary | ICD-10-CM | POA: Diagnosis not present

## 2022-12-26 DIAGNOSIS — M5442 Lumbago with sciatica, left side: Secondary | ICD-10-CM | POA: Diagnosis not present

## 2022-12-26 DIAGNOSIS — R140 Abdominal distension (gaseous): Secondary | ICD-10-CM | POA: Diagnosis not present

## 2022-12-27 ENCOUNTER — Other Ambulatory Visit: Payer: Self-pay | Admitting: Nurse Practitioner

## 2022-12-27 DIAGNOSIS — K760 Fatty (change of) liver, not elsewhere classified: Secondary | ICD-10-CM

## 2022-12-29 DIAGNOSIS — M5442 Lumbago with sciatica, left side: Secondary | ICD-10-CM | POA: Diagnosis not present

## 2022-12-29 DIAGNOSIS — M48062 Spinal stenosis, lumbar region with neurogenic claudication: Secondary | ICD-10-CM | POA: Diagnosis not present

## 2023-01-02 DIAGNOSIS — M48062 Spinal stenosis, lumbar region with neurogenic claudication: Secondary | ICD-10-CM | POA: Diagnosis not present

## 2023-01-02 DIAGNOSIS — M5442 Lumbago with sciatica, left side: Secondary | ICD-10-CM | POA: Diagnosis not present

## 2023-01-02 NOTE — Progress Notes (Signed)
[facility] Quality Team Note  Name: BRALLAN DRATH Date of Birth: 05-Feb-1951 MRN: [id number] Date: 01/02/2023  [facility] Quality Team has reviewed this patient's chart, please see recommendations below:  [facility] Quality Other; (GSD GAP- GLYCEMIC STATUS ASSESSMENT. PATIENT DUE FOR A1C. RESULT MUST BE LESS THAN OR EQUAL TO 9.0 FOR GAP CLOSURE)

## 2023-01-05 DIAGNOSIS — M48062 Spinal stenosis, lumbar region with neurogenic claudication: Secondary | ICD-10-CM | POA: Diagnosis not present

## 2023-01-05 DIAGNOSIS — M5442 Lumbago with sciatica, left side: Secondary | ICD-10-CM | POA: Diagnosis not present

## 2023-01-09 DIAGNOSIS — M5442 Lumbago with sciatica, left side: Secondary | ICD-10-CM | POA: Diagnosis not present

## 2023-01-09 DIAGNOSIS — M48062 Spinal stenosis, lumbar region with neurogenic claudication: Secondary | ICD-10-CM | POA: Diagnosis not present

## 2023-01-12 DIAGNOSIS — M48062 Spinal stenosis, lumbar region with neurogenic claudication: Secondary | ICD-10-CM | POA: Diagnosis not present

## 2023-01-12 DIAGNOSIS — M5442 Lumbago with sciatica, left side: Secondary | ICD-10-CM | POA: Diagnosis not present

## 2023-01-16 DIAGNOSIS — M5442 Lumbago with sciatica, left side: Secondary | ICD-10-CM | POA: Diagnosis not present

## 2023-01-16 DIAGNOSIS — M48062 Spinal stenosis, lumbar region with neurogenic claudication: Secondary | ICD-10-CM | POA: Diagnosis not present

## 2023-01-18 ENCOUNTER — Other Ambulatory Visit: Payer: PPO

## 2023-01-19 DIAGNOSIS — M5442 Lumbago with sciatica, left side: Secondary | ICD-10-CM | POA: Diagnosis not present

## 2023-01-19 DIAGNOSIS — M48062 Spinal stenosis, lumbar region with neurogenic claudication: Secondary | ICD-10-CM | POA: Diagnosis not present

## 2023-01-23 DIAGNOSIS — M5442 Lumbago with sciatica, left side: Secondary | ICD-10-CM | POA: Diagnosis not present

## 2023-01-23 DIAGNOSIS — M48062 Spinal stenosis, lumbar region with neurogenic claudication: Secondary | ICD-10-CM | POA: Diagnosis not present

## 2023-01-24 DIAGNOSIS — R7309 Other abnormal glucose: Secondary | ICD-10-CM | POA: Diagnosis not present

## 2023-01-24 DIAGNOSIS — M5442 Lumbago with sciatica, left side: Secondary | ICD-10-CM | POA: Diagnosis not present

## 2023-01-24 DIAGNOSIS — Z0000 Encounter for general adult medical examination without abnormal findings: Secondary | ICD-10-CM | POA: Diagnosis not present

## 2023-01-24 DIAGNOSIS — K449 Diaphragmatic hernia without obstruction or gangrene: Secondary | ICD-10-CM | POA: Diagnosis not present

## 2023-01-24 DIAGNOSIS — Z6835 Body mass index (BMI) 35.0-35.9, adult: Secondary | ICD-10-CM | POA: Diagnosis not present

## 2023-01-24 DIAGNOSIS — I10 Essential (primary) hypertension: Secondary | ICD-10-CM | POA: Diagnosis not present

## 2023-01-24 DIAGNOSIS — K219 Gastro-esophageal reflux disease without esophagitis: Secondary | ICD-10-CM | POA: Diagnosis not present

## 2023-01-24 DIAGNOSIS — H8113 Benign paroxysmal vertigo, bilateral: Secondary | ICD-10-CM | POA: Diagnosis not present

## 2023-01-24 DIAGNOSIS — G8929 Other chronic pain: Secondary | ICD-10-CM | POA: Diagnosis not present

## 2023-01-24 DIAGNOSIS — J4520 Mild intermittent asthma, uncomplicated: Secondary | ICD-10-CM | POA: Diagnosis not present

## 2023-01-26 DIAGNOSIS — M5442 Lumbago with sciatica, left side: Secondary | ICD-10-CM | POA: Diagnosis not present

## 2023-01-26 DIAGNOSIS — M48062 Spinal stenosis, lumbar region with neurogenic claudication: Secondary | ICD-10-CM | POA: Diagnosis not present

## 2023-01-30 DIAGNOSIS — M48062 Spinal stenosis, lumbar region with neurogenic claudication: Secondary | ICD-10-CM | POA: Diagnosis not present

## 2023-01-30 DIAGNOSIS — M5442 Lumbago with sciatica, left side: Secondary | ICD-10-CM | POA: Diagnosis not present

## 2023-01-31 ENCOUNTER — Ambulatory Visit
Admission: RE | Admit: 2023-01-31 | Discharge: 2023-01-31 | Disposition: A | Discharge status: Home / Self Care | Payer: PPO | Source: Ambulatory Visit | Attending: Internal Medicine | Admitting: Internal Medicine

## 2023-01-31 ENCOUNTER — Other Ambulatory Visit: Payer: Self-pay | Admitting: Internal Medicine

## 2023-01-31 DIAGNOSIS — M7989 Other specified soft tissue disorders: Secondary | ICD-10-CM | POA: Insufficient documentation

## 2023-01-31 DIAGNOSIS — K760 Fatty (change of) liver, not elsewhere classified: Secondary | ICD-10-CM | POA: Diagnosis not present

## 2023-01-31 DIAGNOSIS — E1165 Type 2 diabetes mellitus with hyperglycemia: Secondary | ICD-10-CM | POA: Diagnosis not present

## 2023-01-31 DIAGNOSIS — J4520 Mild intermittent asthma, uncomplicated: Secondary | ICD-10-CM | POA: Diagnosis not present

## 2023-01-31 DIAGNOSIS — I10 Essential (primary) hypertension: Secondary | ICD-10-CM | POA: Diagnosis not present

## 2023-01-31 DIAGNOSIS — Z6837 Body mass index (BMI) 37.0-37.9, adult: Secondary | ICD-10-CM | POA: Diagnosis not present

## 2023-02-02 DIAGNOSIS — M5442 Lumbago with sciatica, left side: Secondary | ICD-10-CM | POA: Diagnosis not present

## 2023-02-02 DIAGNOSIS — M48062 Spinal stenosis, lumbar region with neurogenic claudication: Secondary | ICD-10-CM | POA: Diagnosis not present

## 2023-02-06 DIAGNOSIS — M5442 Lumbago with sciatica, left side: Secondary | ICD-10-CM | POA: Diagnosis not present

## 2023-02-06 DIAGNOSIS — M48062 Spinal stenosis, lumbar region with neurogenic claudication: Secondary | ICD-10-CM | POA: Diagnosis not present

## 2023-02-09 DIAGNOSIS — M5442 Lumbago with sciatica, left side: Secondary | ICD-10-CM | POA: Diagnosis not present

## 2023-02-09 DIAGNOSIS — M48062 Spinal stenosis, lumbar region with neurogenic claudication: Secondary | ICD-10-CM | POA: Diagnosis not present

## 2023-02-13 DIAGNOSIS — M5442 Lumbago with sciatica, left side: Secondary | ICD-10-CM | POA: Diagnosis not present

## 2023-02-13 DIAGNOSIS — M48062 Spinal stenosis, lumbar region with neurogenic claudication: Secondary | ICD-10-CM | POA: Diagnosis not present

## 2023-02-16 DIAGNOSIS — M48062 Spinal stenosis, lumbar region with neurogenic claudication: Secondary | ICD-10-CM | POA: Diagnosis not present

## 2023-02-16 DIAGNOSIS — M5442 Lumbago with sciatica, left side: Secondary | ICD-10-CM | POA: Diagnosis not present

## 2023-02-20 DIAGNOSIS — M48062 Spinal stenosis, lumbar region with neurogenic claudication: Secondary | ICD-10-CM | POA: Diagnosis not present

## 2023-02-20 DIAGNOSIS — M5442 Lumbago with sciatica, left side: Secondary | ICD-10-CM | POA: Diagnosis not present

## 2023-02-23 DIAGNOSIS — M48062 Spinal stenosis, lumbar region with neurogenic claudication: Secondary | ICD-10-CM | POA: Diagnosis not present

## 2023-02-23 DIAGNOSIS — M5442 Lumbago with sciatica, left side: Secondary | ICD-10-CM | POA: Diagnosis not present

## 2023-02-27 DIAGNOSIS — M48062 Spinal stenosis, lumbar region with neurogenic claudication: Secondary | ICD-10-CM | POA: Diagnosis not present

## 2023-02-27 DIAGNOSIS — M5442 Lumbago with sciatica, left side: Secondary | ICD-10-CM | POA: Diagnosis not present

## 2023-03-02 DIAGNOSIS — M48062 Spinal stenosis, lumbar region with neurogenic claudication: Secondary | ICD-10-CM | POA: Diagnosis not present

## 2023-03-02 DIAGNOSIS — M5442 Lumbago with sciatica, left side: Secondary | ICD-10-CM | POA: Diagnosis not present

## 2023-03-06 DIAGNOSIS — M5442 Lumbago with sciatica, left side: Secondary | ICD-10-CM | POA: Diagnosis not present

## 2023-03-06 DIAGNOSIS — M48062 Spinal stenosis, lumbar region with neurogenic claudication: Secondary | ICD-10-CM | POA: Diagnosis not present

## 2023-03-09 DIAGNOSIS — M48062 Spinal stenosis, lumbar region with neurogenic claudication: Secondary | ICD-10-CM | POA: Diagnosis not present

## 2023-03-09 DIAGNOSIS — M5442 Lumbago with sciatica, left side: Secondary | ICD-10-CM | POA: Diagnosis not present

## 2023-03-29 DIAGNOSIS — K5900 Constipation, unspecified: Secondary | ICD-10-CM | POA: Diagnosis not present

## 2023-03-29 DIAGNOSIS — Z6835 Body mass index (BMI) 35.0-35.9, adult: Secondary | ICD-10-CM | POA: Diagnosis not present

## 2023-03-29 DIAGNOSIS — K760 Fatty (change of) liver, not elsewhere classified: Secondary | ICD-10-CM | POA: Diagnosis not present

## 2023-03-29 DIAGNOSIS — Z789 Other specified health status: Secondary | ICD-10-CM | POA: Diagnosis not present

## 2023-04-10 ENCOUNTER — Telehealth: Payer: Self-pay | Admitting: Physician Assistant

## 2023-04-10 NOTE — Telephone Encounter (Signed)
Patient called stating Tresa Endo was supposed to get back to him regarding mask to wear with his glasses. Sent message to [facility]

## 2023-04-23 ENCOUNTER — Encounter: Payer: Self-pay | Admitting: Physician Assistant

## 2023-04-23 ENCOUNTER — Ambulatory Visit: Payer: PPO | Admitting: Physician Assistant

## 2023-04-23 DIAGNOSIS — R0602 Shortness of breath: Secondary | ICD-10-CM

## 2023-04-23 DIAGNOSIS — G4733 Obstructive sleep apnea (adult) (pediatric): Secondary | ICD-10-CM | POA: Diagnosis not present

## 2023-04-23 DIAGNOSIS — Z7189 Other specified counseling: Secondary | ICD-10-CM | POA: Diagnosis not present

## 2023-04-23 DIAGNOSIS — J4520 Mild intermittent asthma, uncomplicated: Secondary | ICD-10-CM

## 2023-04-23 NOTE — Progress Notes (Signed)
[facility] [address] [place], [place] [address]  Pulmonary Sleep Medicine   Office Visit Note  Patient Name: Charles Mata DOB: 1951/02/23 MRN [id number]  Date of Service: 04/23/2023  Complaints/HPI: Pt is here for routine pulmonary follow up. Combivent inhaler is too expensive. Has not used in a year, but does state fall/winter is when he typically has flares and wonders if sample available to use for rescue. Given sample of airsupra to use as needed. Denies any problem with steroid previously. Walking on treadmill daily and thinks this is helping. Pt is using cpap nightly and is benefiting from use. Does still need alternative mask and has not heard about it from DME provider still. Will follow up on this. Denies dryness or SOB. Occasional headache recently possibly due to weather changes.    95 percentile pressure 9  95th percentile leak 12.7  apnea index 0.7 /hr  apnea-hypopnea index  1.0 /hr  total days used  >4 hr 89 days  total days used <4 hr 0 days Total compliance 99 percent  ROS  General: (-) fever, (-) chills, (-) night sweats, (-) weakness Skin: (-) rashes, (-) itching,. Eyes: (-) visual changes, (-) redness, (-) itching. Nose and Sinuses: (-) nasal stuffiness or itchiness, (-) postnasal drip, (-) nosebleeds, (-) sinus trouble. Mouth and Throat: (-) sore throat, (-) hoarseness. Neck: (-) swollen glands, (-) enlarged thyroid, (-) neck pain. Respiratory: - cough, (-) bloody sputum, - shortness of breath, - wheezing. Cardiovascular: - ankle swelling, (-) chest pain. Lymphatic: (-) lymph node enlargement. Neurologic: (-) numbness, (-) tingling. Psychiatric: (-) anxiety, (-) depression   Current Medication: Outpatient Encounter Medications as of 04/23/2023  Medication Sig   acidophilus (RISAQUAD) CAPS capsule Take 1 capsule by mouth daily.   albuterol (PROVENTIL) (2.5 MG/3ML) 0.083% nebulizer solution Take 3 mLs (2.5 mg total) by nebulization every 6  (six) hours as needed for wheezing or shortness of breath.   Albuterol-Budesonide (AIRSUPRA) 90-80 MCG/ACT AERO Inhale into the lungs.   Apoaequorin (PREVAGEN PO) Take 1 capsule by mouth daily.   CVS VITAMIN B12 1000 MCG tablet Take 1,000 mcg by mouth daily.   EPINEPHrine (EPIPEN 2-PAK) 0.3 mg/0.3 mL IJ SOAJ injection Inject 0.3 mg into the muscle as needed for anaphylaxis.   glipiZIDE (GLUCOTROL) 10 MG tablet Take 10 mg by mouth 2 (two) times daily before a meal.   glucose blood test strip    HYDROcodone-acetaminophen (NORCO) 5-325 MG tablet Take 1-2 tablets by mouth every 4 (four) hours as needed for moderate pain or severe pain.   ipratropium-albuterol (DUONEB) 0.5-2.5 (3) MG/3ML SOLN Take 3 mLs by nebulization every 4 (four) hours as needed.   linaclotide (LINZESS) 72 MCG capsule Take 72 mcg by mouth daily as needed (constipation).   lisinopril (PRINIVIL,ZESTRIL) 5 MG tablet Take 5 mg by mouth daily.   Misc. Devices MISC by Does not apply route. cpap   montelukast (SINGULAIR) 10 MG tablet TAKE 1 TABLET BY MOUTH EVERY DAY   omeprazole (PRILOSEC) 40 MG capsule Take 40 mg by mouth daily.   ondansetron (ZOFRAN ODT) 4 MG disintegrating tablet Take 1 tablet (4 mg total) by mouth every 8 (eight) hours as needed for nausea or vomiting.   OZEMPIC, 0.25 OR 0.5 MG/DOSE, 2 MG/1.5ML SOPN Inject into the skin.   rosuvastatin (CRESTOR) 10 MG tablet Take 5 mg by mouth daily.   tamsulosin (FLOMAX) 0.4 MG CAPS capsule Take 0.4 mg by mouth daily.   Vitamin D, Ergocalciferol, (DRISDOL) 50000  units CAPS capsule Take 50,000 Units by mouth every 7 (seven) days.   [DISCONTINUED] Ipratropium-Albuterol (COMBIVENT RESPIMAT) 20-100 MCG/ACT AERS respimat Inhale 1 puff into the lungs every 6 (six) hours as needed for wheezing.   No facility-administered encounter medications on file as of 04/23/2023.    Surgical History: Past Surgical History:  Procedure Laterality Date   CARDIAC CATHETERIZATION     x 2. all good    COLONOSCOPY     COLONOSCOPY WITH PROPOFOL N/A 06/06/2022   Procedure: COLONOSCOPY WITH PROPOFOL;  Surgeon: Regis Bill, MD;  Location: [facility] ENDOSCOPY;  Service: Endoscopy;  Laterality: N/A;   ESOPHAGOGASTRODUODENOSCOPY (EGD) WITH PROPOFOL N/A 06/06/2022   Procedure: ESOPHAGOGASTRODUODENOSCOPY (EGD) WITH PROPOFOL;  Surgeon: Regis Bill, MD;  Location: [facility] ENDOSCOPY;  Service: Endoscopy;  Laterality: N/A;   KNEE ARTHROSCOPY WITH MEDIAL MENISECTOMY Left 02/01/2021   Procedure: Left Arthroscopy partial medial meniscectomy, lateral meniscectomy;  Surgeon: Signa Kell, MD;  Location: [facility] ORS;  Service: Orthopedics;  Laterality: Left;    Medical History: Past Medical History:  Diagnosis Date   Arthritis    Asthma    BPH (benign prostatic hyperplasia)    Complication of anesthesia    difficulty waking him up after colonoscopy   Diabetes (HCC)    GERD (gastroesophageal reflux disease)    Hyperlipidemia    Hypertension    Sickle cell trait (HCC)    Sleep apnea    uses cpap    Family History: Family History  Problem Relation Age of Onset   Diabetes Mother    Moyamoya disease Father     Social History: Social History   Socioeconomic History   Marital status: Widowed    Spouse name: Not on file   Number of children: Not on file   Years of education: Not on file   Highest education level: Not on file  Occupational History   Occupation: truck driver    Comment: part time  Tobacco Use   Smoking status: Former   Smokeless tobacco: Never  Vaping Use   Vaping status: Never Used  Substance and Sexual Activity   Alcohol use: Yes    Alcohol/week: 2.0 standard drinks of alcohol    Types: 2 Glasses of wine per week    Comment: no alcohol for 2 months   Drug use: No   Sexual activity: Not on file  Other Topics Concern   Not on file  Social History Narrative   Patient lives with his daughter.She will bring him to the hospital for surgery.   Minimal stairs to  climb.   Feels safe in his home.   Social Determinants of Health   Financial Resource Strain: Low Risk  (01/31/2023)   Received from [facility], [facility]   Overall Financial Resource Strain (CARDIA)    Difficulty of Paying Living Expenses: Not hard at all  Food Insecurity: No Food Insecurity (01/31/2023)   Received from [facility], [facility]   Hunger Vital Sign    Worried About Running Out of Food in the Last Year: Never true    Ran Out of Food in the Last Year: Never true  Transportation Needs: No Transportation Needs (01/31/2023)   Received from [facility], [facility]   [facility] - Transportation    In the past 12 months, has lack of transportation kept you from medical appointments or from getting medications?: No    Lack of Transportation (Non-Medical): No  Physical Activity: Not on file  Stress: Not on file  Social Connections: Not on file  Intimate Partner Violence: Not on file    Vital Signs: Blood pressure 112/78, pulse 80, temperature 97.9 F (36.6 C), resp. rate 16, height 5\' 8"  (1.727 m), weight 247 lb 12.8 oz (112.4 kg), SpO2 99%.  Examination: General Appearance: The patient is well-developed, well-nourished, and in no distress. Skin: Gross inspection of skin unremarkable. Head: normocephalic, no gross deformities. Eyes: no gross deformities noted. ENT: ears appear grossly normal no exudates. Neck: Supple. No thyromegaly. No LAD. Respiratory: Lungs clear to asucultation. Cardiovascular: Normal S1 and S2 without murmur or rub. Extremities: No cyanosis. pulses are equal. Neurologic: Alert and oriented. No involuntary movements.  LABS: No results found for this or any previous visit (from the past 2160 hour(s)).  Radiology: US Venous Img Lower Unilateral Left (DVT)  Result Date: 01/31/2023 CLINICAL DATA:  Left lower extremity swelling EXAM: LEFT  LOWER EXTREMITY VENOUS DOPPLER ULTRASOUND TECHNIQUE: Gray-scale sonography with graded compression, as well as color Doppler and duplex ultrasound were performed to evaluate the lower extremity deep venous systems from the level of the common femoral vein and including the common femoral, femoral, profunda femoral, popliteal and calf veins including the posterior tibial, peroneal and gastrocnemius veins when visible. The superficial great saphenous vein was also interrogated. Spectral Doppler was utilized to evaluate flow at rest and with distal augmentation maneuvers in the common femoral, femoral and popliteal veins. COMPARISON:  None Available. FINDINGS: Contralateral Common Femoral Vein: Respiratory phasicity is normal and symmetric with the symptomatic side. No evidence of thrombus. Normal compressibility. Common Femoral Vein: No evidence of thrombus. Normal compressibility, respiratory phasicity and response to augmentation. Saphenofemoral Junction: No evidence of thrombus. Normal compressibility and flow on color Doppler imaging. Profunda Femoral Vein: No evidence of thrombus. Normal compressibility and flow on color Doppler imaging. Femoral Vein: No evidence of thrombus. Normal compressibility, respiratory phasicity and response to augmentation. Popliteal Vein: No evidence of thrombus. Normal compressibility, respiratory phasicity and response to augmentation. Calf Veins: No evidence of thrombus. Normal compressibility and flow on color Doppler imaging. Other Findings:  None. IMPRESSION: No evidence of deep venous thrombosis. Electronically Signed   By: Olive Bass M.D.   On: 01/31/2023 15:27    No results found.  No results found.    Assessment and Plan: Patient Active Problem List   Diagnosis Date Noted   Seasonal allergic rhinitis due to pollen 11/02/2021   Benign paroxysmal positional vertigo 11/30/2016   Chronic midline low back pain without sciatica 10/07/2015   Type 2 diabetes  mellitus, controlled (HCC) 11/26/2014   COPD, mild (HCC) 01/29/2014   Sleep apnea 01/29/2014   Hyperlipemia 01/16/2014   Hypertension 01/16/2014    1. Obstructive sleep apnea [G47.33] Continue excellent compliance  2. CPAP use counseling CPAP couseling-Discussed importance of adequate CPAP use as well as proper care and cleaning techniques of machine and all supplies.  3. Mild intermittent chronic asthma without complication - Pulmonary Function Test; Future  4. SOB (shortness of breath) - Pulmonary Function Test; Future   General Counseling: I have discussed the findings of the evaluation and examination with Jeannett Senior.  I have also discussed any further diagnostic evaluation thatmay be needed or ordered today. Aurthur verbalizes understanding of the findings of todays visit. We also reviewed his medications today and discussed drug interactions and side effects including but not limited excessive drowsiness and altered mental states. We also discussed that there is always a risk  not just to him but also people around him. he has been encouraged to call the office with any questions or concerns that should arise related to todays visit.  Orders Placed This Encounter  Procedures   Pulmonary Function Test    Standing Status:   Future    Standing Expiration Date:   04/22/2024    Order Specific Question:   Where should this test be performed?    Answer:   [facility]    Order Specific Question:   Full PFT: includes the following: basic spirometry, spirometry pre & post bronchodilator, diffusion capacity (DLCO), lung volumes    Answer:   Full PFT     Time spent: 30  I have personally obtained a history, examined the patient, evaluated laboratory and imaging results, formulated the assessment and plan and placed orders. This patient was seen by Lynn Ito, PA-C in collaboration with Dr. Freda Munro as a part of collaborative care agreement.     Yevonne Pax, MD [facility] Pulmonary  and Critical Care Sleep medicine

## 2023-05-30 DIAGNOSIS — J4520 Mild intermittent asthma, uncomplicated: Secondary | ICD-10-CM | POA: Diagnosis not present

## 2023-05-30 DIAGNOSIS — M7989 Other specified soft tissue disorders: Secondary | ICD-10-CM | POA: Diagnosis not present

## 2023-05-30 DIAGNOSIS — Z6837 Body mass index (BMI) 37.0-37.9, adult: Secondary | ICD-10-CM | POA: Diagnosis not present

## 2023-05-30 DIAGNOSIS — Z125 Encounter for screening for malignant neoplasm of prostate: Secondary | ICD-10-CM | POA: Diagnosis not present

## 2023-05-30 DIAGNOSIS — I10 Essential (primary) hypertension: Secondary | ICD-10-CM | POA: Diagnosis not present

## 2023-05-30 DIAGNOSIS — E1165 Type 2 diabetes mellitus with hyperglycemia: Secondary | ICD-10-CM | POA: Diagnosis not present

## 2023-06-06 DIAGNOSIS — K449 Diaphragmatic hernia without obstruction or gangrene: Secondary | ICD-10-CM | POA: Diagnosis not present

## 2023-06-06 DIAGNOSIS — K219 Gastro-esophageal reflux disease without esophagitis: Secondary | ICD-10-CM | POA: Diagnosis not present

## 2023-06-06 DIAGNOSIS — Z6837 Body mass index (BMI) 37.0-37.9, adult: Secondary | ICD-10-CM | POA: Diagnosis not present

## 2023-06-06 DIAGNOSIS — Z87891 Personal history of nicotine dependence: Secondary | ICD-10-CM | POA: Diagnosis not present

## 2023-06-06 DIAGNOSIS — I10 Essential (primary) hypertension: Secondary | ICD-10-CM | POA: Diagnosis not present

## 2023-06-06 DIAGNOSIS — M7989 Other specified soft tissue disorders: Secondary | ICD-10-CM | POA: Diagnosis not present

## 2023-06-06 DIAGNOSIS — E1165 Type 2 diabetes mellitus with hyperglycemia: Secondary | ICD-10-CM | POA: Diagnosis not present

## 2023-06-06 DIAGNOSIS — G4733 Obstructive sleep apnea (adult) (pediatric): Secondary | ICD-10-CM | POA: Diagnosis not present

## 2023-06-06 DIAGNOSIS — Z136 Encounter for screening for cardiovascular disorders: Secondary | ICD-10-CM | POA: Diagnosis not present

## 2023-06-06 DIAGNOSIS — N1831 Chronic kidney disease, stage 3a: Secondary | ICD-10-CM | POA: Diagnosis not present

## 2023-06-06 DIAGNOSIS — J4520 Mild intermittent asthma, uncomplicated: Secondary | ICD-10-CM | POA: Diagnosis not present

## 2023-06-20 ENCOUNTER — Ambulatory Visit: Payer: PPO

## 2023-06-26 ENCOUNTER — Other Ambulatory Visit: Payer: Self-pay | Admitting: Internal Medicine

## 2023-06-26 DIAGNOSIS — Z136 Encounter for screening for cardiovascular disorders: Secondary | ICD-10-CM

## 2023-07-03 DIAGNOSIS — H2513 Age-related nuclear cataract, bilateral: Secondary | ICD-10-CM | POA: Diagnosis not present

## 2023-07-03 DIAGNOSIS — E119 Type 2 diabetes mellitus without complications: Secondary | ICD-10-CM | POA: Diagnosis not present

## 2023-07-04 ENCOUNTER — Ambulatory Visit
Admission: RE | Admit: 2023-07-04 | Discharge: 2023-07-04 | Disposition: A | Discharge status: Home / Self Care | Payer: PPO | Source: Ambulatory Visit | Attending: Internal Medicine | Admitting: Internal Medicine

## 2023-07-04 DIAGNOSIS — Z136 Encounter for screening for cardiovascular disorders: Secondary | ICD-10-CM | POA: Diagnosis not present

## 2023-07-04 DIAGNOSIS — I10 Essential (primary) hypertension: Secondary | ICD-10-CM | POA: Insufficient documentation

## 2023-07-04 DIAGNOSIS — Z87891 Personal history of nicotine dependence: Secondary | ICD-10-CM | POA: Insufficient documentation

## 2023-07-04 DIAGNOSIS — Z9189 Other specified personal risk factors, not elsewhere classified: Secondary | ICD-10-CM | POA: Diagnosis not present

## 2023-07-04 DIAGNOSIS — H8110 Benign paroxysmal vertigo, unspecified ear: Secondary | ICD-10-CM | POA: Insufficient documentation

## 2023-07-06 DIAGNOSIS — E119 Type 2 diabetes mellitus without complications: Secondary | ICD-10-CM | POA: Diagnosis not present

## 2023-07-06 DIAGNOSIS — I10 Essential (primary) hypertension: Secondary | ICD-10-CM | POA: Diagnosis not present

## 2023-08-01 DIAGNOSIS — E119 Type 2 diabetes mellitus without complications: Secondary | ICD-10-CM | POA: Diagnosis not present

## 2023-08-01 DIAGNOSIS — I10 Essential (primary) hypertension: Secondary | ICD-10-CM | POA: Diagnosis not present

## 2023-08-02 ENCOUNTER — Telehealth: Payer: Self-pay | Admitting: Physician Assistant

## 2023-08-02 NOTE — Telephone Encounter (Signed)
Patient called stating he needs more Cpap/Bipap supplies, but unable to reach [facility]. I gave him correct telephone # to call-Toni

## 2023-08-02 NOTE — Telephone Encounter (Signed)
Cpap supply order faxed to [facility]. Notified patient-Charles Mata

## 2023-09-26 ENCOUNTER — Telehealth: Payer: Self-pay

## 2023-09-26 NOTE — Telephone Encounter (Signed)
LVM for patient to reschedule 10/03/23 PFT appt due to equipment issues.

## 2023-09-28 DIAGNOSIS — J4520 Mild intermittent asthma, uncomplicated: Secondary | ICD-10-CM | POA: Diagnosis not present

## 2023-09-28 DIAGNOSIS — I10 Essential (primary) hypertension: Secondary | ICD-10-CM | POA: Diagnosis not present

## 2023-09-28 DIAGNOSIS — E1165 Type 2 diabetes mellitus with hyperglycemia: Secondary | ICD-10-CM | POA: Diagnosis not present

## 2023-09-28 DIAGNOSIS — K219 Gastro-esophageal reflux disease without esophagitis: Secondary | ICD-10-CM | POA: Diagnosis not present

## 2023-09-28 DIAGNOSIS — M7989 Other specified soft tissue disorders: Secondary | ICD-10-CM | POA: Diagnosis not present

## 2023-09-28 DIAGNOSIS — G4733 Obstructive sleep apnea (adult) (pediatric): Secondary | ICD-10-CM | POA: Diagnosis not present

## 2023-09-28 DIAGNOSIS — Z87891 Personal history of nicotine dependence: Secondary | ICD-10-CM | POA: Diagnosis not present

## 2023-09-28 DIAGNOSIS — Z6837 Body mass index (BMI) 37.0-37.9, adult: Secondary | ICD-10-CM | POA: Diagnosis not present

## 2023-09-28 DIAGNOSIS — K449 Diaphragmatic hernia without obstruction or gangrene: Secondary | ICD-10-CM | POA: Diagnosis not present

## 2023-10-03 ENCOUNTER — Encounter: Payer: PPO | Admitting: Internal Medicine

## 2023-10-05 DIAGNOSIS — Z6837 Body mass index (BMI) 37.0-37.9, adult: Secondary | ICD-10-CM | POA: Diagnosis not present

## 2023-10-05 DIAGNOSIS — E1165 Type 2 diabetes mellitus with hyperglycemia: Secondary | ICD-10-CM | POA: Diagnosis not present

## 2023-10-05 DIAGNOSIS — J449 Chronic obstructive pulmonary disease, unspecified: Secondary | ICD-10-CM | POA: Diagnosis not present

## 2023-10-05 DIAGNOSIS — G8929 Other chronic pain: Secondary | ICD-10-CM | POA: Diagnosis not present

## 2023-10-05 DIAGNOSIS — I10 Essential (primary) hypertension: Secondary | ICD-10-CM | POA: Diagnosis not present

## 2023-10-05 DIAGNOSIS — Z0000 Encounter for general adult medical examination without abnormal findings: Secondary | ICD-10-CM | POA: Diagnosis not present

## 2023-10-05 DIAGNOSIS — E1122 Type 2 diabetes mellitus with diabetic chronic kidney disease: Secondary | ICD-10-CM | POA: Diagnosis not present

## 2023-10-05 DIAGNOSIS — Z6835 Body mass index (BMI) 35.0-35.9, adult: Secondary | ICD-10-CM | POA: Diagnosis not present

## 2023-10-05 DIAGNOSIS — M545 Low back pain, unspecified: Secondary | ICD-10-CM | POA: Diagnosis not present

## 2023-10-05 DIAGNOSIS — E66812 Obesity, class 2: Secondary | ICD-10-CM | POA: Diagnosis not present

## 2023-10-05 DIAGNOSIS — N1831 Chronic kidney disease, stage 3a: Secondary | ICD-10-CM | POA: Diagnosis not present

## 2023-10-05 DIAGNOSIS — N183 Chronic kidney disease, stage 3 unspecified: Secondary | ICD-10-CM | POA: Diagnosis not present

## 2023-10-22 ENCOUNTER — Ambulatory Visit: Payer: PPO | Admitting: Physician Assistant

## 2023-11-02 DIAGNOSIS — E1122 Type 2 diabetes mellitus with diabetic chronic kidney disease: Secondary | ICD-10-CM | POA: Diagnosis not present

## 2023-11-02 DIAGNOSIS — N183 Chronic kidney disease, stage 3 unspecified: Secondary | ICD-10-CM | POA: Diagnosis not present

## 2023-11-02 DIAGNOSIS — E782 Mixed hyperlipidemia: Secondary | ICD-10-CM | POA: Diagnosis not present

## 2023-11-07 ENCOUNTER — Telehealth: Payer: Self-pay | Admitting: Internal Medicine

## 2023-11-07 NOTE — Telephone Encounter (Signed)
 Left vm and sent mychart message to confirm 11/14/23 appointment-Toni

## 2023-11-14 ENCOUNTER — Encounter: Payer: PPO | Admitting: Internal Medicine

## 2023-11-21 ENCOUNTER — Ambulatory Visit: Admitting: Internal Medicine

## 2023-11-21 DIAGNOSIS — J4520 Mild intermittent asthma, uncomplicated: Secondary | ICD-10-CM | POA: Diagnosis not present

## 2023-11-21 DIAGNOSIS — R0602 Shortness of breath: Secondary | ICD-10-CM

## 2023-11-29 ENCOUNTER — Encounter: Payer: Self-pay | Admitting: Physician Assistant

## 2023-11-29 ENCOUNTER — Telehealth: Payer: Self-pay | Admitting: Physician Assistant

## 2023-11-29 ENCOUNTER — Ambulatory Visit: Payer: PPO | Admitting: Physician Assistant

## 2023-11-29 VITALS — BP 132/85 | HR 87 | Temp 98.5°F | Resp 16 | Ht 68.0 in | Wt 245.2 lb

## 2023-11-29 DIAGNOSIS — J4520 Mild intermittent asthma, uncomplicated: Secondary | ICD-10-CM | POA: Diagnosis not present

## 2023-11-29 DIAGNOSIS — E669 Obesity, unspecified: Secondary | ICD-10-CM | POA: Diagnosis not present

## 2023-11-29 DIAGNOSIS — Z7189 Other specified counseling: Secondary | ICD-10-CM

## 2023-11-29 DIAGNOSIS — G4733 Obstructive sleep apnea (adult) (pediatric): Secondary | ICD-10-CM | POA: Diagnosis not present

## 2023-11-29 NOTE — Progress Notes (Signed)
 [facility] [address] [place], [place] [address]  Pulmonary Sleep Medicine   Office Visit Note  Patient Name: Charles Mata DOB: 08-07-1951 MRN [id number]  Date of Service: 11/29/2023  Complaints/HPI: Pt is here for routine pulmonary follow up. Doing ok with CPAP, really hates it, but will wear it for 4 hours. Does not like mask. Wants something that he could still wear while reading/watching TV with glasses on. Has not needed [place] in [place]. Breathing has been doing well. Quit smoking in the 90s. PFT reviewed and is stable  CPAP compliance 10/30/23-11/28/23 Use 30/30days >4 hours 100% Avg use 4 hours 44 mins Settings CPAP @ 9cm H2O Leak 95th 19.2 AHI 0.7  ROS  General: (-) fever, (-) chills, (-) night sweats, (-) weakness Skin: (-) rashes, (-) itching,. Eyes: (-) visual changes, (-) redness, (-) itching. Nose and Sinuses: (-) nasal stuffiness or itchiness, (-) postnasal drip, (-) nosebleeds, (-) sinus trouble. Mouth and Throat: (-) sore throat, (-) hoarseness. Neck: (-) swollen glands, (-) enlarged thyroid, (-) neck pain. Respiratory: - cough, (-) bloody sputum, - shortness of breath, - wheezing. Cardiovascular: - ankle swelling, (-) chest pain. Lymphatic: (-) lymph node enlargement. Neurologic: (-) numbness, (-) tingling. Psychiatric: (-) anxiety, (-) depression   Current Medication: Outpatient Encounter Medications as of 11/29/2023  Medication Sig   acidophilus (RISAQUAD) CAPS capsule Take 1 capsule by mouth daily.   albuterol (PROVENTIL) (2.5 MG/3ML) 0.083% nebulizer solution Take 3 mLs (2.5 mg total) by nebulization every 6 (six) hours as needed for wheezing or shortness of breath.   Albuterol-Budesonide (AIRSUPRA) 90-80 MCG/ACT AERO Inhale into the lungs.   Apoaequorin (PREVAGEN PO) Take 1 capsule by mouth daily.   CVS VITAMIN B12 1000 MCG tablet Take 1,000 mcg by mouth daily.   EPINEPHrine (EPIPEN 2-PAK) 0.3 mg/0.3 mL IJ SOAJ injection Inject  0.3 mg into the muscle as needed for anaphylaxis.   glipiZIDE (GLUCOTROL) 10 MG tablet Take 10 mg by mouth 2 (two) times daily before a meal.   glucose blood test strip    HYDROcodone-acetaminophen (NORCO) 5-325 MG tablet Take 1-2 tablets by mouth every 4 (four) hours as needed for moderate pain or severe pain.   ipratropium-albuterol (DUONEB) 0.5-2.5 (3) MG/3ML SOLN Take 3 mLs by nebulization every 4 (four) hours as needed.   linaclotide (LINZESS) 72 MCG capsule Take 72 mcg by mouth daily as needed (constipation).   lisinopril (PRINIVIL,ZESTRIL) 5 MG tablet Take 5 mg by mouth daily.   Misc. Devices MISC by Does not apply route. cpap   montelukast (SINGULAIR) 10 MG tablet TAKE 1 TABLET BY MOUTH EVERY DAY   omeprazole (PRILOSEC) 40 MG capsule Take 40 mg by mouth daily.   ondansetron (ZOFRAN ODT) 4 MG disintegrating tablet Take 1 tablet (4 mg total) by mouth every 8 (eight) hours as needed for nausea or vomiting.   OZEMPIC, 0.25 OR 0.5 MG/DOSE, 2 MG/1.5ML SOPN Inject into the skin.   rosuvastatin (CRESTOR) 10 MG tablet Take 5 mg by mouth daily.   tamsulosin (FLOMAX) 0.4 MG CAPS capsule Take 0.4 mg by mouth daily.   Vitamin D, Ergocalciferol, (DRISDOL) 50000 units CAPS capsule Take 50,000 Units by mouth every 7 (seven) days.   No facility-administered encounter medications on file as of 11/29/2023.    Surgical History: Past Surgical History:  Procedure Laterality Date   CARDIAC CATHETERIZATION     x 2. all good   COLONOSCOPY     COLONOSCOPY WITH PROPOFOL N/A 06/06/2022   Procedure:  COLONOSCOPY WITH PROPOFOL;  Surgeon: Regis Bill, MD;  Location: [facility] ENDOSCOPY;  Service: Endoscopy;  Laterality: N/A;   ESOPHAGOGASTRODUODENOSCOPY (EGD) WITH PROPOFOL N/A 06/06/2022   Procedure: ESOPHAGOGASTRODUODENOSCOPY (EGD) WITH PROPOFOL;  Surgeon: Regis Bill, MD;  Location: [facility] ENDOSCOPY;  Service: Endoscopy;  Laterality: N/A;   KNEE ARTHROSCOPY WITH MEDIAL MENISECTOMY Left 02/01/2021    Procedure: Left Arthroscopy partial medial meniscectomy, lateral meniscectomy;  Surgeon: Signa Kell, MD;  Location: [facility] ORS;  Service: Orthopedics;  Laterality: Left;    Medical History: Past Medical History:  Diagnosis Date   Arthritis    Asthma    BPH (benign prostatic hyperplasia)    Complication of anesthesia    difficulty waking him up after colonoscopy   Diabetes (HCC)    GERD (gastroesophageal reflux disease)    Hyperlipidemia    Hypertension    Sickle cell trait (HCC)    Sleep apnea    uses cpap    Family History: Family History  Problem Relation Age of Onset   Diabetes Mother    Moyamoya disease Father     Social History: Social History   Socioeconomic History   Marital status: Widowed    Spouse name: Not on file   Number of children: Not on file   Years of education: Not on file   Highest education level: Not on file  Occupational History   Occupation: truck driver    Comment: part time  Tobacco Use   Smoking status: Former   Smokeless tobacco: Never  Vaping Use   Vaping status: Never Used  Substance and Sexual Activity   Alcohol use: Yes    Alcohol/week: 2.0 standard drinks of alcohol    Types: 2 Glasses of wine per week    Comment: no alcohol for 2 months   Drug use: No   Sexual activity: Not on file  Other Topics Concern   Not on file  Social History Narrative   Patient lives with his daughter.She will bring him to the hospital for surgery.   Minimal stairs to climb.   Feels safe in his home.   Social Drivers of Corporate investment banker Strain: Low Risk  (06/06/2023)   Received from [facility]   Overall Financial Resource Strain (CARDIA)    Difficulty of Paying Living Expenses: Not hard at all  Food Insecurity: No Food Insecurity (06/06/2023)   [facility]   Hunger Vital Sign    Ran Out of Food in the Last Year: Never true    Worried About Running Out of Food in the Last Year:  Never true  Transportation Needs: No Transportation Needs (06/06/2023)   Received from [facility]   PRAPARE - Transportation    Lack of Transportation (Non-Medical): No    In the past 12 months, has lack of transportation kept you from medical appointments or from getting medications?: No  Physical Activity: Not on file  Stress: Not on file  Social Connections: Not on file  Intimate Partner Violence: Not on file    Vital Signs: Blood pressure 132/85, pulse 87, temperature 98.5 F (36.9 C), resp. rate 16, height 5\' 8"  (1.727 m), weight 245 lb 3.2 oz (111.2 kg), SpO2 97%.  Examination: General Appearance: The patient is well-developed, well-nourished, and in no distress. Skin: Gross inspection of skin unremarkable. Head: normocephalic, no gross deformities. Eyes: no gross deformities noted. ENT: ears appear grossly normal no exudates. Neck: Supple. No thyromegaly. No LAD. Respiratory:  Lungs clear to auscultation bilaterally. Cardiovascular: Normal S1 and S2 without murmur or rub. Extremities: No cyanosis. pulses are equal. Neurologic: Alert and oriented. No involuntary movements.  LABS: No results found for this or any previous visit (from the past 2160 hours).  Radiology: US AORTA DUPLEX LIMITED Result Date: 07/09/2023 : PROCEDURE: ULTRASOUND AORTA HISTORY: Patient is a 73 y/o  M with AAA screening. COMPARISON: CT abdomen/pelvis 06/04/2019. TECHNIQUE: Two-dimensional grayscale, color and spectral Doppler ultrasound of the abdominal aortic and iliac arteries was performed. FINDINGS: The aorta measures 2.4 cm, 2.2 cm and 1.8 cm in its proximal, mid and distal aspects, respectively. There is no significant plaque. There is no aneurysm. It demonstrates normal color and Doppler flow. The right common iliac artery measures 1.2 cm and the left common iliac artery measures 1.2 cm. IMPRESSION: 1. Unremarkable ultrasound of the abdominal aorta.  No aneurysm. Thank you for  allowing [place] to assist in the care of this patient. Electronically Signed   By: Lestine Box M.D.   On: 07/09/2023 12:34    No results found.  No results found.    Assessment and Plan: Patient Active Problem List   Diagnosis Date Noted   Seasonal allergic rhinitis due to pollen 11/02/2021   Benign paroxysmal positional vertigo 11/30/2016   Chronic midline low back pain without sciatica 10/07/2015   Type 2 diabetes mellitus, controlled (HCC) 11/26/2014   COPD, mild (HCC) 01/29/2014   Sleep apnea 01/29/2014   Hyperlipemia 01/16/2014   Hypertension 01/16/2014    1. Obstructive sleep apnea (Primary) Continue excellent compliance and will send supply order for new mask style - For home use only DME continuous positive airway pressure (CPAP)  2. CPAP use counseling CPAP couseling-Discussed importance of adequate CPAP use as well as proper care and cleaning techniques of machine and all supplies.  3. Mild intermittent chronic asthma without complication Well controlled, using airsupra only as needed  4. Obesity (BMI 30-39.9) Obesity Counseling: Had a lengthy discussion regarding patients BMI and weight issues. Patient was instructed on portion control as well as increased activity. Also discussed caloric restrictions with trying to maintain intake less than 2000 Kcal. Discussions were made in accordance with the 5As of weight management. Simple actions such as not eating late and if able to, taking a walk is suggested.    General Counseling: I have discussed the findings of the evaluation and examination with Jeannett Senior.  I have also discussed any further diagnostic evaluation thatmay be needed or ordered today. Danuel verbalizes understanding of the findings of todays visit. We also reviewed his medications today and discussed drug interactions and side effects including but not limited excessive drowsiness and altered mental states. We also discussed that there is always a risk not just  to him but also people around him. he has been encouraged to call the office with any questions or concerns that should arise related to todays visit.  Orders Placed This Encounter  Procedures   For home use only DME continuous positive airway pressure (CPAP)    Needs new supplies, would like different mask style    Length of Need:   Lifetime    Patient has OSA or probable OSA:   Yes    Is the patient currently using CPAP in the home:   Yes    Settings:   Other see comments    CPAP supplies needed:   Mask, headgear, cushions, filters, heated tubing and water chamber    Additional equipment included:  Heated humification and supplies     Time spent: 30  I have personally obtained a history, examined the patient, evaluated laboratory and imaging results, formulated the assessment and plan and placed orders. This patient was seen by Taylor Favia, PA-C in collaboration with Dr. Cam Cava as a part of collaborative care agreement.     Cordie Deters, MD [facility] Pulmonary and Critical Care Sleep medicine

## 2023-11-29 NOTE — Telephone Encounter (Signed)
Notified [name] & Sarah w/ [facility] of cpap supply order-Toni

## 2023-12-03 NOTE — Procedures (Signed)
 [facility] [address] [place], [address]    Complete Pulmonary Function Testing Interpretation:  FINDINGS:   A forced vital capacity is normal.  FEV1 is normal.  The FEV1 FEC ratio is normal.  Post bronchodilator there was no significant change noted.  Total lung capacity is mildly decreased.  Residual volume is decreased.  Residual volume total lung capacity ratio is decreased.  FRC was decreased.  The DLCO was mildly decreased.  IMPRESSION:    This pulmonary function study is suggestive of mild restrictive lung disease with a reduction in the DLCO clinical correlation is recommended  Charles Deters, MD [facility] Pulmonary Critical Care Medicine Sleep Medicine

## 2023-12-11 LAB — PULMONARY FUNCTION TEST

## 2023-12-31 DIAGNOSIS — J449 Chronic obstructive pulmonary disease, unspecified: Secondary | ICD-10-CM | POA: Diagnosis not present

## 2023-12-31 DIAGNOSIS — N183 Chronic kidney disease, stage 3 unspecified: Secondary | ICD-10-CM | POA: Diagnosis not present

## 2023-12-31 DIAGNOSIS — G4730 Sleep apnea, unspecified: Secondary | ICD-10-CM | POA: Diagnosis not present

## 2023-12-31 DIAGNOSIS — E1122 Type 2 diabetes mellitus with diabetic chronic kidney disease: Secondary | ICD-10-CM | POA: Diagnosis not present

## 2023-12-31 DIAGNOSIS — I10 Essential (primary) hypertension: Secondary | ICD-10-CM | POA: Diagnosis not present

## 2024-01-28 DIAGNOSIS — G8929 Other chronic pain: Secondary | ICD-10-CM | POA: Diagnosis not present

## 2024-01-28 DIAGNOSIS — Z0000 Encounter for general adult medical examination without abnormal findings: Secondary | ICD-10-CM | POA: Diagnosis not present

## 2024-01-28 DIAGNOSIS — N1831 Chronic kidney disease, stage 3a: Secondary | ICD-10-CM | POA: Diagnosis not present

## 2024-01-28 DIAGNOSIS — M545 Low back pain, unspecified: Secondary | ICD-10-CM | POA: Diagnosis not present

## 2024-01-28 DIAGNOSIS — I10 Essential (primary) hypertension: Secondary | ICD-10-CM | POA: Diagnosis not present

## 2024-01-28 DIAGNOSIS — J449 Chronic obstructive pulmonary disease, unspecified: Secondary | ICD-10-CM | POA: Diagnosis not present

## 2024-01-28 DIAGNOSIS — Z6837 Body mass index (BMI) 37.0-37.9, adult: Secondary | ICD-10-CM | POA: Diagnosis not present

## 2024-01-28 DIAGNOSIS — Z6835 Body mass index (BMI) 35.0-35.9, adult: Secondary | ICD-10-CM | POA: Diagnosis not present

## 2024-01-28 DIAGNOSIS — E1165 Type 2 diabetes mellitus with hyperglycemia: Secondary | ICD-10-CM | POA: Diagnosis not present

## 2024-01-28 DIAGNOSIS — E66812 Obesity, class 2: Secondary | ICD-10-CM | POA: Diagnosis not present

## 2024-02-04 DIAGNOSIS — J301 Allergic rhinitis due to pollen: Secondary | ICD-10-CM | POA: Diagnosis not present

## 2024-02-04 DIAGNOSIS — K648 Other hemorrhoids: Secondary | ICD-10-CM | POA: Diagnosis not present

## 2024-02-04 DIAGNOSIS — E1165 Type 2 diabetes mellitus with hyperglycemia: Secondary | ICD-10-CM | POA: Diagnosis not present

## 2024-02-04 DIAGNOSIS — Z6835 Body mass index (BMI) 35.0-35.9, adult: Secondary | ICD-10-CM | POA: Diagnosis not present

## 2024-02-04 DIAGNOSIS — K449 Diaphragmatic hernia without obstruction or gangrene: Secondary | ICD-10-CM | POA: Diagnosis not present

## 2024-02-04 DIAGNOSIS — K219 Gastro-esophageal reflux disease without esophagitis: Secondary | ICD-10-CM | POA: Diagnosis not present

## 2024-02-04 DIAGNOSIS — I10 Essential (primary) hypertension: Secondary | ICD-10-CM | POA: Diagnosis not present

## 2024-03-04 DIAGNOSIS — G4733 Obstructive sleep apnea (adult) (pediatric): Secondary | ICD-10-CM | POA: Diagnosis not present

## 2024-05-05 DIAGNOSIS — N401 Enlarged prostate with lower urinary tract symptoms: Secondary | ICD-10-CM | POA: Diagnosis not present

## 2024-05-05 DIAGNOSIS — N183 Chronic kidney disease, stage 3 unspecified: Secondary | ICD-10-CM | POA: Diagnosis not present

## 2024-05-05 DIAGNOSIS — E1165 Type 2 diabetes mellitus with hyperglycemia: Secondary | ICD-10-CM | POA: Diagnosis not present

## 2024-05-05 DIAGNOSIS — E1122 Type 2 diabetes mellitus with diabetic chronic kidney disease: Secondary | ICD-10-CM | POA: Diagnosis not present

## 2024-05-05 DIAGNOSIS — R3914 Feeling of incomplete bladder emptying: Secondary | ICD-10-CM | POA: Diagnosis not present

## 2024-05-29 ENCOUNTER — Ambulatory Visit: Admitting: Physician Assistant

## 2024-06-03 ENCOUNTER — Ambulatory Visit: Admitting: Internal Medicine

## 2024-06-03 ENCOUNTER — Encounter: Payer: Self-pay | Admitting: Internal Medicine

## 2024-06-03 VITALS — BP 118/68 | HR 76 | Temp 98.0°F | Resp 16 | Ht 68.0 in | Wt 226.0 lb

## 2024-06-03 DIAGNOSIS — E669 Obesity, unspecified: Secondary | ICD-10-CM | POA: Diagnosis not present

## 2024-06-03 DIAGNOSIS — G4733 Obstructive sleep apnea (adult) (pediatric): Secondary | ICD-10-CM | POA: Diagnosis not present

## 2024-06-03 DIAGNOSIS — J4520 Mild intermittent asthma, uncomplicated: Secondary | ICD-10-CM | POA: Diagnosis not present

## 2024-06-03 DIAGNOSIS — J301 Allergic rhinitis due to pollen: Secondary | ICD-10-CM

## 2024-06-03 NOTE — Progress Notes (Signed)
 [facility] [address] [place], [place] [address]  Pulmonary Sleep Medicine   Office Visit Note  Patient Name: Charles Mata DOB: Dec 31, 1950 MRN [id number]  Date of Service: 06/03/2024  Complaints/HPI: He states he is more active and has been doing treadmill daily. Patient denies having chest pain during exercise. Patient states he does drink but much less than previously. He is on ozempic for DM and this has helped him lose weight too. He is not smoking  Office Spirometry Results:     ROS  General: (-) fever, (-) chills, (-) night sweats, (-) weakness Skin: (-) rashes, (-) itching,. Eyes: (-) visual changes, (-) redness, (-) itching. Nose and Sinuses: (-) nasal stuffiness or itchiness, (-) postnasal drip, (-) nosebleeds, (-) sinus trouble. Mouth and Throat: (-) sore throat, (-) hoarseness. Neck: (-) swollen glands, (-) enlarged thyroid , (-) neck pain. Respiratory: - cough, (-) bloody sputum, - shortness of breath, - wheezing. Cardiovascular: - ankle swelling, (-) chest pain. Lymphatic: (-) lymph node enlargement. Neurologic: (-) numbness, (-) tingling. Psychiatric: (-) anxiety, (-) depression   Current Medication: Outpatient Encounter Medications as of 06/03/2024  Medication Sig   acidophilus (RISAQUAD) CAPS capsule Take 1 capsule by mouth daily.   albuterol  (PROVENTIL ) (2.5 MG/3ML) 0.083% nebulizer solution Take 3 mLs (2.5 mg total) by nebulization every 6 (six) hours as needed for wheezing or shortness of breath.   Apoaequorin (PREVAGEN PO) Take 1 capsule by mouth daily.   [employer] VITAMIN B12 1000 MCG tablet Take 1,000 mcg by mouth daily.   EPINEPHrine  (EPIPEN  2-PAK) 0.3 mg/0.3 mL IJ SOAJ injection Inject 0.3 mg into the muscle as needed for anaphylaxis.   glipiZIDE (GLUCOTROL) 10 MG tablet Take 10 mg by mouth 2 (two) times daily before a meal.   glucose blood test strip    HYDROcodone -acetaminophen  (NORCO) 5-325 MG tablet Take 1-2 tablets by mouth every  4 (four) hours as needed for moderate pain or severe pain.   ipratropium-albuterol  (DUONEB) 0.5-2.5 (3) MG/3ML SOLN Take 3 mLs by nebulization every 4 (four) hours as needed.   linaclotide (LINZESS) 72 MCG capsule Take 72 mcg by mouth daily as needed (constipation).   lisinopril (PRINIVIL,ZESTRIL) 5 MG tablet Take 5 mg by mouth daily.   Misc. Devices MISC by Does not apply route. cpap   montelukast (SINGULAIR) 10 MG tablet TAKE 1 TABLET BY MOUTH EVERY DAY   omeprazole (PRILOSEC) 40 MG capsule Take 40 mg by mouth daily.   ondansetron  (ZOFRAN  ODT) 4 MG disintegrating tablet Take 1 tablet (4 mg total) by mouth every 8 (eight) hours as needed for nausea or vomiting.   OZEMPIC, 0.25 OR 0.5 MG/DOSE, 2 MG/1.5ML SOPN Inject into the skin.   rosuvastatin (CRESTOR) 10 MG tablet Take 5 mg by mouth daily.   tamsulosin (FLOMAX) 0.4 MG CAPS capsule Take 0.4 mg by mouth daily.   Vitamin D, Ergocalciferol, (DRISDOL) 50000 units CAPS capsule Take 50,000 Units by mouth every 7 (seven) days.   [DISCONTINUED] Albuterol -Budesonide  (AIRSUPRA) 90-80 MCG/ACT AERO Inhale into the lungs.   No facility-administered encounter medications on file as of 06/03/2024.    Surgical History: Past Surgical History:  Procedure Laterality Date   CARDIAC CATHETERIZATION     x 2. all good   COLONOSCOPY     COLONOSCOPY WITH PROPOFOL  N/A 06/06/2022   Procedure: COLONOSCOPY WITH PROPOFOL ;  Surgeon: Maryruth Ole DASEN, MD;  Location: [facility] ENDOSCOPY;  Service: Endoscopy;  Laterality: N/A;   ESOPHAGOGASTRODUODENOSCOPY (EGD) WITH PROPOFOL  N/A 06/06/2022   Procedure:  ESOPHAGOGASTRODUODENOSCOPY (EGD) WITH PROPOFOL ;  Surgeon: Maryruth Ole DASEN, MD;  Location: [facility] ENDOSCOPY;  Service: Endoscopy;  Laterality: N/A;   KNEE ARTHROSCOPY WITH MEDIAL MENISECTOMY Left 02/01/2021   Procedure: Left Arthroscopy partial medial meniscectomy, lateral meniscectomy;  Surgeon: Tobie Priest, MD;  Location: [facility] ORS;  Service: Orthopedics;  Laterality:  Left;    Medical History: Past Medical History:  Diagnosis Date   Arthritis    Asthma    BPH (benign prostatic hyperplasia)    Complication of anesthesia    difficulty waking him up after colonoscopy   Diabetes (HCC)    GERD (gastroesophageal reflux disease)    Hyperlipidemia    Hypertension    Sickle cell trait    Sleep apnea    uses cpap    Family History: Family History  Problem Relation Age of Onset   Diabetes Mother    [place] disease Father     Social History: Social History   Socioeconomic History   Marital status: Widowed    Spouse name: Not on file   Number of children: Not on file   Years of education: Not on file   Highest education level: Not on file  Occupational History   Occupation: truck driver    Comment: part time  Tobacco Use   Smoking status: Former   Smokeless tobacco: Never  Vaping Use   Vaping status: Never Used  Substance and Sexual Activity   Alcohol  use: Yes    Alcohol /week: 2.0 standard drinks of alcohol     Types: 2 Glasses of wine per week    Comment: no alcohol  for 2 months   Drug use: No   Sexual activity: Not on file  Other Topics Concern   Not on file  Social History Narrative   Patient lives with his daughter.She will bring him to the hospital for surgery.   Minimal stairs to climb.   Feels safe in his home.   Social Drivers of Corporate investment banker Strain: Low Risk  (06/06/2023)   Received from [facility]   Overall Financial Resource Strain (CARDIA)    Difficulty of Paying Living Expenses: Not hard at all  Food Insecurity: No Food Insecurity (06/06/2023)   Received from [facility]   Hunger Vital Sign    Ran Out of Food in the Last Year: Never true    Worried About Running Out of Food in the Last Year: Never true  Transportation Needs: No Transportation Needs (06/06/2023)   Received from [facility]   PRAPARE - Transportation    Lack of Transportation  (Non-Medical): No    In the past 12 months, has lack of transportation kept you from medical appointments or from getting medications?: No  Physical Activity: Not on file  Stress: Not on file  Social Connections: Not on file  Intimate Partner Violence: Not on file    Vital Signs: Blood pressure 118/68, pulse 76, temperature 98 F (36.7 C), resp. rate 16, height 5' 8 (1.727 m), weight 226 lb (102.5 kg), SpO2 99%.  Examination: General Appearance: The patient is well-developed, well-nourished, and in no distress. Skin: Gross inspection of skin unremarkable. Head: normocephalic, no gross deformities. Eyes: no gross deformities noted. ENT: ears appear grossly normal no exudates. Neck: Supple. No thyromegaly. No LAD. Respiratory: no rhonchi noted. Cardiovascular: Normal S1 and S2 without murmur or rub. Extremities: No cyanosis. pulses are equal. Neurologic: Alert and oriented. No involuntary movements.  LABS: No results found for this or  any previous visit (from the past 2160 hours).  Radiology: US  AORTA DUPLEX LIMITED Result Date: 07/09/2023 : PROCEDURE: ULTRASOUND AORTA HISTORY: Patient is a 73 y/o  M with AAA screening. COMPARISON: CT abdomen/pelvis 06/04/2019. TECHNIQUE: Two-dimensional grayscale, color and spectral Doppler ultrasound of the abdominal aortic and iliac arteries was performed. FINDINGS: The aorta measures 2.4 cm, 2.2 cm and 1.8 cm in its proximal, mid and distal aspects, respectively. There is no significant plaque. There is no aneurysm. It demonstrates normal color and Doppler flow. The right common iliac artery measures 1.2 cm and the left common iliac artery measures 1.2 cm. IMPRESSION: 1. Unremarkable ultrasound of the abdominal aorta.  No aneurysm. Thank you for allowing us  to assist in the care of this patient. Electronically Signed   By: Lynwood Mains M.D.   On: 07/09/2023 12:34    No results found.  No results found.  Assessment and Plan: Patient Active  Problem List   Diagnosis Date Noted   Seasonal allergic rhinitis due to pollen 11/02/2021   Benign paroxysmal positional vertigo 11/30/2016   Chronic midline low back pain without sciatica 10/07/2015   Type 2 diabetes mellitus, controlled (HCC) 11/26/2014   COPD, mild (HCC) 01/29/2014   Sleep apnea 01/29/2014   Hyperlipemia 01/16/2014   Hypertension 01/16/2014    1. Obstructive sleep apnea (Primary) On CPAP therapy he has been under control. He states sleeping better and also feels better during the day  2. Mild intermittent chronic asthma without complication He feels that he has not needed the inhalers like he used to need. Breathing has been stable - Spirometry with graph; Future  3. Obesity (BMI 30-39.9) Work on weight loss and diet continue exercise  4. Seasonal allergic rhinitis due to pollen [J30.1] Continue allergy  meds as tolerated    General Counseling: I have discussed the findings of the evaluation and examination with Garnette.  I have also discussed any further diagnostic evaluation thatmay be needed or ordered today. Nahun verbalizes understanding of the findings of todays visit. We also reviewed his medications today and discussed drug interactions and side effects including but not limited excessive drowsiness and altered mental states. We also discussed that there is always a risk not just to him but also people around him. he has been encouraged to call the office with any questions or concerns that should arise related to todays visit.  No orders of the defined types were placed in this encounter.    Time spent: 63  I have personally obtained a history, examined the patient, evaluated laboratory and imaging results, formulated the assessment and plan and placed orders.    Elfreda DELENA Bathe, MD [facility] Pulmonary and Critical Care Sleep medicine

## 2024-06-03 NOTE — Patient Instructions (Signed)
 Asthma, Adult  Asthma is a condition that causes swelling and narrowing of the airways. These are the passages that lead from the nose and mouth down into the lungs. When asthma symptoms get worse it is called an asthma attack or flare. This can make it hard to breathe. Asthma flares can range from minor to life-threatening. There is no cure for asthma, but medicines and lifestyle changes can help to control it. What are the causes? It is not known exactly what causes asthma, but certain things can cause asthma symptoms to get worse (triggers). What can trigger an asthma attack? Cigarette smoke. Mold. Dust. Your pet's skin flakes (dander). Cockroaches. Pollen. Air pollution (like household cleaners, wood smoke, smog, or Therapist, occupational). What are the signs or symptoms? Trouble breathing (shortness of breath). Coughing. Making high-pitched whistling sounds when you breathe, most often when you breathe out (wheezing). Chest tightness. Tiredness with little activity. Poor exercise tolerance. How is this treated? Controller medicines that help prevent asthma symptoms. Fast-acting reliever or rescue medicines. These give short-term relief of asthma symptoms. Allergy medicines if your attacks are brought on by allergens. Medicines to help control the body's defense (immune) system. Staying away from the things that cause asthma attacks. Follow these instructions at home: Avoiding triggers in your home Do not allow anyone to smoke in your home. Limit use of fireplaces and wood stoves. Get rid of pests (such as roaches and mice) and their droppings. Keep your home clean. Clean your floors. Dust regularly. Use cleaning products that do not smell. Wash bed sheets and blankets every week in hot water. Dry them in a dryer. Have someone vacuum when you are not home. Change your heating and air conditioning filters often. Use blankets that are made of polyester or cotton. General  instructions Take over-the-counter and prescription medicines only as told by your doctor. Do not smoke or use any products that contain nicotine or tobacco. If you need help quitting, ask your doctor. Stay away from secondhand smoke. Avoid doing things outdoors when allergen counts are high and when air quality is low. Warm up before you exercise. Take time to cool down after exercise. Use a peak flow meter as told by your doctor. A peak flow meter is a tool that measures how well your lungs are working. Keep track of the peak flow meter's readings. Write them down. Follow your asthma action plan. This is a written plan for taking care of your asthma and treating your attacks. Make sure you get all the shots (vaccines) that your doctor recommends. Ask your doctor about a flu shot and a pneumonia shot. Keep all follow-up visits. Contact a doctor if: You have wheezing, shortness of breath, or a cough even while taking medicine to prevent attacks. The mucus you cough up (sputum) is thicker than usual. The mucus you cough up changes from clear or white to yellow, green, gray, or is bloody. You have problems from the medicine you are taking, such as: A rash. Itching. Swelling. Trouble breathing. You need reliever medicines more than 2-3 times a week. Your peak flow reading is still at 50-79% of your personal best after following the action plan for 1 hour. You have a fever. Get help right away if: You seem to be worse and are not responding to medicine during an asthma attack. You are short of breath even at rest. You get short of breath when doing very little activity. You have trouble eating, drinking, or talking. You have chest  pain or tightness. You have a fast heartbeat. Your lips or fingernails start to turn blue. You are light-headed or dizzy, or you faint. Your peak flow is less than 50% of your personal best. You feel too tired to breathe normally. These symptoms may be an  emergency. Get help right away. Call 911. Do not wait to see if the symptoms will go away. Do not drive yourself to the hospital. Summary Asthma is a long-term (chronic) condition in which the airways get tight and narrow. An asthma attack can make it hard to breathe. Asthma cannot be cured, but medicines and lifestyle changes can help control it. Make sure you understand how to avoid triggers and how and when to use your medicines. Avoid things that can cause allergy symptoms (allergens). These include animal skin flakes (dander) and pollen from trees or grass. Avoid things that pollute the air. These may include household cleaners, wood smoke, smog, or chemical odors. This information is not intended to replace advice given to you by your health care provider. Make sure you discuss any questions you have with your health care provider. Document Revised: 05/09/2021 Document Reviewed: 05/09/2021 Elsevier Patient Education  2024 [employer].

## 2024-06-04 NOTE — Addendum Note (Signed)
 Addended by: ALMER BI on: 06/04/2024 02:55 PM   Modules accepted: Orders

## 2024-06-04 NOTE — Addendum Note (Signed)
 Addended by: ALMER BI on: 06/04/2024 02:57 PM   Modules accepted: Orders

## 2024-06-29 ENCOUNTER — Emergency Department: Admission: EM | Admit: 2024-06-29 | Discharge: 2024-06-30 | Disposition: A

## 2024-06-29 ENCOUNTER — Other Ambulatory Visit: Payer: Self-pay

## 2024-06-29 ENCOUNTER — Encounter: Payer: Self-pay | Admitting: Emergency Medicine

## 2024-06-29 ENCOUNTER — Emergency Department

## 2024-06-29 DIAGNOSIS — E119 Type 2 diabetes mellitus without complications: Secondary | ICD-10-CM | POA: Diagnosis not present

## 2024-06-29 DIAGNOSIS — J4489 Other specified chronic obstructive pulmonary disease: Secondary | ICD-10-CM | POA: Diagnosis not present

## 2024-06-29 DIAGNOSIS — J441 Chronic obstructive pulmonary disease with (acute) exacerbation: Secondary | ICD-10-CM

## 2024-06-29 DIAGNOSIS — R0602 Shortness of breath: Secondary | ICD-10-CM | POA: Diagnosis not present

## 2024-06-29 DIAGNOSIS — I10 Essential (primary) hypertension: Secondary | ICD-10-CM | POA: Diagnosis not present

## 2024-06-29 MED ORDER — IPRATROPIUM-ALBUTEROL 0.5-2.5 (3) MG/3ML IN SOLN
3.0000 mL | Freq: Once | RESPIRATORY_TRACT | Status: AC
  Administered 2024-06-29: 3 mL via RESPIRATORY_TRACT
  Filled 2024-06-29: qty 3

## 2024-06-29 NOTE — ED Triage Notes (Signed)
 To ER with report of increased shortness of breath today. Phlegm in the morning. Reports asthma, but no nebs at home. Ran out and they expired 2 years ago. Denies any fevers.

## 2024-06-29 NOTE — ED Notes (Signed)
Patient refused the covid swab

## 2024-06-29 NOTE — ED Provider Notes (Signed)
 [facility] Provider Note    Event Date/Time   First MD Initiated Contact with Patient 06/29/24 2259     (approximate)   History   Shortness of Breath  To ER with report of increased shortness of breath today. Phlegm in the morning. Reports asthma, but no nebs at home. Ran out and they expired 2 years ago. Denies any fevers.   HPI DIRCK BUTCH is a 73 y.o. male PMH COPD/asthma, hypertension, hyperlipidemia, T2DM, OSA presents for evaluation of shortness of breath - Present for the past 4-5 days, accompanied by cough productive of yellow sputum.  No leg swelling.  Equivocal as to whether patient has had any chest discomfort, says it is primarily muscle aches, denies any frank pain or pressure -No leg swelling.  No history of DVT/PE, no recent surgery/station/travel -Says he has a nebulizer which expired about 2 years ago so was not able to use it     Physical Exam   Triage Vital Signs: ED Triage Vitals  Encounter Vitals Group     BP 06/29/24 2218 (!) 140/81     Girls Systolic BP Percentile --      Girls Diastolic BP Percentile --      Boys Systolic BP Percentile --      Boys Diastolic BP Percentile --      Pulse Rate 06/29/24 2218 83     Resp 06/29/24 2218 17     Temp 06/29/24 2218 98.5 F (36.9 C)     Temp Source 06/29/24 2218 Oral     SpO2 06/29/24 2218 99 %     Weight 06/29/24 2216 220 lb (99.8 kg)     Height 06/29/24 2216 5' 8 (1.727 m)     Head Circumference --      Peak Flow --      Pain Score 06/29/24 2216 4     Pain Loc --      Pain Education --      Exclude from Growth Chart --     Most recent vital signs: Vitals:   06/29/24 2218 06/29/24 2253  BP: (!) 140/81   Pulse: 83   Resp: 17   Temp: 98.5 F (36.9 C)   SpO2: 99% 99%     General: Awake, no distress.  CV:  Good peripheral perfusion. RRR, RP 2+ Resp:  Normal effort. CTAB mild expiratory wheezing in right upper lobe Abd:  No distention. Nontender to deep  palpation throughout Other:  No lower extremity edema   ED Results / Procedures / Treatments   Labs (all labs ordered are listed, but only abnormal results are displayed) Labs Reviewed  BASIC METABOLIC PANEL WITH GFR - Abnormal; Notable for the following components:      Result Value   Glucose, Bld 113 (*)    Creatinine, Ser 1.39 (*)    GFR, Estimated 54 (*)    All other components within normal limits  RESP PANEL BY RT-PCR (RSV, FLU A&B, COVID)  [id number]  CBC  PRO BRAIN NATRIURETIC PEPTIDE  TROPONIN T, HIGH SENSITIVITY     EKG  See ED course below   RADIOLOGY Radiology interpreted by myself radiology report reviewed.  No acute pathology identified.    PROCEDURES:  Critical Care performed: No  Procedures   MEDICATIONS ORDERED IN ED: Medications  ipratropium-albuterol  (DUONEB) 0.5-2.5 (3) MG/3ML nebulizer solution 3 mL (3 mLs Nebulization Given 06/29/24 2333)     IMPRESSION / MDM / ASSESSMENT AND PLAN / ED COURSE  I reviewed the triage vital signs and the nursing notes.                              DDX/MDM/AP: Differential diagnosis includes, but is not limited to, likely mild asthma/COPD exacerbation, consider underlying viral syndrome including influenza or COVID-19, consider underlying pneumonia, doubt ACS, CHF  Plan: - Labs EKG -DuoNeb - Chest x-ray -  reassess, anticipate discharge home  Patient's presentation is most consistent with acute presentation with potential threat to life or bodily function.  The patient is on the cardiac monitor to evaluate for evidence of arrhythmia and/or significant heart rate changes.  ED course below.  Workup unremarkable, symptoms present for several days and no clear chest pain, no indication for repeat troponin at this time.  Overall suspect/asthma exacerbation with trace wheezing here.  Remains with normal work of breathing, satting 99% on room air.  Subjectively feels better after single DuoNeb.  Rx nebulizer  albuterol  solution with plan for follow-up with his PMD and pulmonologist.  ED return precautions in place.  Patient agrees with plan.  Clinical Course as of 06/30/24 0035  Sun Jun 29, 2024  2300 CXR: IMPRESSION: No active cardiopulmonary disease.   [MM]  2300 Ecg = sinus rhythm, rate 83, no gross ST elevation or depression, no significant repolarization normality, normal axis, normals.  No evidence of ischemia or arrhythmia my interpretation. [MM]  2359 Pt declined viral swab [MM]  Mon Jun 30, 2024  0013 Cbc wnl [MM]  0032 Trop wnl Bnp wnl [MM]    Clinical Course User Index [MM] Clarine Ozell LABOR, MD     FINAL CLINICAL IMPRESSION(S) / ED DIAGNOSES   Final diagnoses:  SOB (shortness of breath)  COPD exacerbation (HCC)     Rx / DC Orders   ED Discharge Orders          Ordered    albuterol  (ACCUNEB ) 1.25 MG/3ML nebulizer solution  Every 6 hours PRN        06/30/24 0016             Note:  This document was prepared using Dragon voice recognition software and may include unintentional dictation errors.   Clarine Ozell LABOR, MD 06/30/24 [phone]

## 2024-06-30 LAB — BASIC METABOLIC PANEL WITH GFR
Anion gap: 10 (ref 5–15)
BUN: 12 mg/dL (ref 8–23)
CO2: 23 mmol/L (ref 22–32)
Calcium: 9.5 mg/dL (ref 8.9–10.3)
Chloride: 102 mmol/L (ref 98–111)
Creatinine, Ser: 1.39 mg/dL — ABNORMAL HIGH (ref 0.61–1.24)
GFR, Estimated: 54 mL/min — ABNORMAL LOW (ref >60)
Glucose, Bld: 113 mg/dL — ABNORMAL HIGH (ref 70–99)
Potassium: 4.2 mmol/L (ref 3.5–5.1)
Sodium: 135 mmol/L (ref 135–145)

## 2024-06-30 LAB — CBC
HCT: 43.7 % (ref 39.0–52.0)
Hemoglobin: 15 g/dL (ref 13.0–17.0)
MCH: 30.7 pg (ref 26.0–34.0)
MCHC: 34.3 g/dL (ref 30.0–36.0)
MCV: 89.5 fL (ref 80.0–100.0)
Platelets: 190 K/uL (ref 150–400)
RBC: 4.88 MIL/uL (ref 4.22–5.81)
RDW: 13.6 % (ref 11.5–15.5)
WBC: 5.7 K/uL (ref 4.0–10.5)
nRBC: 0 % (ref 0.0–0.2)

## 2024-06-30 LAB — TROPONIN T, HIGH SENSITIVITY: Troponin T High Sensitivity: <15 ng/L (ref 0–19)

## 2024-06-30 LAB — PRO BRAIN NATRIURETIC PEPTIDE: Pro Brain Natriuretic Peptide: <50 pg/mL (ref <300.0)

## 2024-06-30 MED ORDER — ALBUTEROL SULFATE 1.25 MG/3ML IN NEBU: 1.0000 | INHALATION_SOLUTION | Freq: Four times a day (QID) | RESPIRATORY_TRACT | 12 refills | Status: AC | PRN

## 2024-06-30 NOTE — Discharge Instructions (Addendum)
 Your evaluation in the emergency department was overall reassuring.  I did hear some very mild wheezing, and I suspect you have a mild flare of your asthma/COPD.  I have refilled your nebulizer solution and I recommend you follow-up with your primary care provider and pulmonologist for reevaluation.  Return to the emergency department with any new or worsening symptoms.

## 2024-06-30 NOTE — ED Notes (Signed)
Patient refused the covid swab

## 2024-06-30 NOTE — ED Notes (Signed)
 Patient refused the covid swab, provider informed

## 2024-07-04 DIAGNOSIS — H43811 Vitreous degeneration, right eye: Secondary | ICD-10-CM | POA: Diagnosis not present

## 2024-07-04 DIAGNOSIS — H2513 Age-related nuclear cataract, bilateral: Secondary | ICD-10-CM | POA: Diagnosis not present

## 2024-07-04 DIAGNOSIS — H43812 Vitreous degeneration, left eye: Secondary | ICD-10-CM | POA: Diagnosis not present

## 2024-07-04 DIAGNOSIS — E119 Type 2 diabetes mellitus without complications: Secondary | ICD-10-CM | POA: Diagnosis not present

## 2024-11-26 ENCOUNTER — Encounter: Admitting: Internal Medicine

## 2024-12-01 ENCOUNTER — Ambulatory Visit: Admitting: Internal Medicine
